# Patient Record
Sex: Male | Born: 1935 | Race: White | Hispanic: No | Smoking: Never smoker
Health system: Southern US, Community
[De-identification: ages and names within clinical notes are randomized; demographics above are authoritative.]

## PROBLEM LIST (undated history)

## (undated) DIAGNOSIS — I472 Ventricular tachycardia, unspecified (HCC): Principal | ICD-10-CM

## (undated) DIAGNOSIS — R9431 Abnormal electrocardiogram [ECG] [EKG]: Secondary | ICD-10-CM

## (undated) DIAGNOSIS — Z950 Presence of cardiac pacemaker: Principal | ICD-10-CM

## (undated) DIAGNOSIS — I639 Cerebral infarction, unspecified: Principal | ICD-10-CM

## (undated) DIAGNOSIS — G301 Alzheimer's disease with late onset: Principal | ICD-10-CM

## (undated) DIAGNOSIS — M1990 Unspecified osteoarthritis, unspecified site: Secondary | ICD-10-CM

## (undated) DIAGNOSIS — E781 Pure hyperglyceridemia: Secondary | ICD-10-CM

## (undated) DIAGNOSIS — E039 Hypothyroidism, unspecified: Secondary | ICD-10-CM

## (undated) DIAGNOSIS — F5102 Adjustment insomnia: Secondary | ICD-10-CM

## (undated) DIAGNOSIS — J209 Acute bronchitis, unspecified: Secondary | ICD-10-CM

## (undated) DIAGNOSIS — F0280 Dementia in other diseases classified elsewhere without behavioral disturbance: Secondary | ICD-10-CM

## (undated) DIAGNOSIS — I4729 Other ventricular tachycardia: Secondary | ICD-10-CM

## (undated) DIAGNOSIS — I480 Paroxysmal atrial fibrillation: Secondary | ICD-10-CM

## (undated) DIAGNOSIS — R7611 Nonspecific reaction to tuberculin skin test without active tuberculosis: Secondary | ICD-10-CM

## (undated) HISTORY — DX: Unspecified osteoarthritis, unspecified site: M19.90

---

## 2012-05-27 LAB — TYPE AND SCREEN
ABO/Rh: A POS
Antibody Screen: NEGATIVE

## 2012-05-27 LAB — EKG, 12 LEAD, INITIAL
Atrial Rate: 75 {beats}/min
Calculated P Axis: 78 degrees
Calculated R Axis: 57 degrees
Calculated T Axis: 61 degrees
Diagnosis: NORMAL
P-R Interval: 176 ms
Q-T Interval: 410 ms
QRS Duration: 92 ms
QTC Calculation (Bezet): 457 ms
Ventricular Rate: 75 {beats}/min

## 2012-05-27 LAB — URINALYSIS W/ REFLEX CULTURE
Bacteria: NEGATIVE /HPF
Bilirubin: NEGATIVE
Blood: NEGATIVE
Glucose: NEGATIVE MG/DL
Leukocyte Esterase: NEGATIVE
Nitrites: NEGATIVE
Protein: NEGATIVE MG/DL
Specific gravity: 1.023 (ref 1.003–1.030)
Urobilinogen: 0.2 EU/DL (ref 0.2–1.0)
pH (UA): 7 (ref 5.0–8.0)

## 2012-05-27 LAB — METABOLIC PANEL, BASIC
Anion gap: 9 mmol/L (ref 5–15)
BUN/Creatinine ratio: 18 (ref 12–20)
BUN: 12 MG/DL (ref 6–20)
CO2: 27 MMOL/L (ref 21–32)
Calcium: 9.1 MG/DL (ref 8.5–10.1)
Chloride: 105 MMOL/L (ref 97–108)
Creatinine: 0.66 MG/DL (ref 0.45–1.15)
GFR est AA: >60 mL/min/{1.73_m2} (ref >60)
GFR est non-AA: >60 mL/min/{1.73_m2} (ref >60)
Glucose: 87 MG/DL (ref 65–100)
Potassium: 4.3 MMOL/L (ref 3.5–5.1)
Sodium: 141 MMOL/L (ref 136–145)

## 2012-05-27 LAB — CBC W/O DIFF
HCT: 43.4 % (ref 36.6–50.3)
HGB: 14.9 g/dL (ref 12.1–17.0)
MCH: 32.8 PG (ref 26.0–34.0)
MCHC: 34.3 g/dL (ref 30.0–36.5)
MCV: 95.6 FL (ref 80.0–99.0)
PLATELET: 153 10*3/uL (ref 150–400)
RBC: 4.54 M/uL (ref 4.10–5.70)
RDW: 14.3 % (ref 11.5–14.5)
WBC: 5.6 10*3/uL (ref 4.1–11.1)

## 2012-05-27 LAB — PROTHROMBIN TIME + INR
INR: 1 (ref 0.9–1.1)
Prothrombin time: 10.9 s (ref 9.4–11.7)

## 2012-05-27 LAB — TYPE & SCREEN
ABO/Rh(D): A POS
Antibody screen: NEGATIVE

## 2012-05-27 LAB — HEMOGLOBIN A1C WITH EAG
Est. average glucose: 114 mg/dL
Hemoglobin A1c: 5.6 % (ref 4.2–6.3)

## 2012-05-27 NOTE — Other (Signed)
Patient given surgical site infection FAQs handout. Discussed importance of good hand hygiene. Patient verbalizes understanding. Diabetes Control Matters Handout given to the patient. Patient attending the Joint Class this morning.

## 2012-05-28 LAB — CULTURE, MRSA

## 2012-05-28 NOTE — Other (Signed)
Faxed PAT testing reports to Dr Dobzyniak.

## 2012-06-15 NOTE — H&P (Signed)
**Note Danny-Identified via Obfuscation** [name]  05/04/2012 1:52 PM  Location: [facility]  Patient #: [id number]  DOB: 04/27/1932  Married / Language: English / Race: White  Male      History of Present Illness??(Carlyle Lipa, MD; 05/04/2012 5:47 PM)  ??????????????????The patient is a 78 year old male who presents with a complaint of right knee pain. This is a patient it's been treated by Dr. Franky Macho for the last several years with respect to his knee.?? He gets periodic injections in his right knee.?? They do help for a period time with his symptoms.?? He also complains of left knee symptoms.?? He states today that the right side is much worse than the left.?? The injections do not seem to be helping him.    Patient does state that his pain pattern has changed since he originally saw Dr. Franky Macho.?? He is now experiencing more pain that radiates up into his right thigh and into his groin.?? When questioned he states that he definitely has problems getting his shoes and socks on.?? He has problems walking.?? He has problems getting into and out of the car.?? He's noticed significant stiffness in his hip.?? He's lost range of motion in his right hip.?? He does state that his symptoms are much different than when he originally saw Dr. Franky Macho.      ??  Problem List/Past Medical??(Matthew A Dobzyniak, MD; 05/04/2012 5:50 PM)  OA, KNEE (715.96)  REVIEW OF SYSTEMS: Systems were reviewed by the provider.  PAIN, KNEE?? (719.46)  MMT, ACUTE (836.0)    ??  Allergies??(Nicky Pugh, MD; 05/04/2012 3:35 PM)  No Known Drug Allergies. 11/18/2011  No Known Allergies. 11/18/2011    ??  Family History??(Nicky Pugh, MD; 05/04/2012 3:35 PM)  Breast cancer    ??  Social History??(Nicky Pugh, MD; 05/04/2012 3:35 PM)  Caffeine use.?? 11/18/2011: Drinks coffee.  HIV risk factors.?? 11/18/2011: no  Exercise.?? 11/18/2011: Walks 3-4 times a week.  Alcohol use.?? 11/18/2011: Drinks beer, wine and liquor 1 time per week having 1-2 drinks per occasion, never having more than 5 drinks per  occasion.  Seat Belt Use.?? 11/18/2011: always  Sun Exposure.?? 11/18/2011: occasionally  Tobacco / smoke exposure.?? 11/18/2011: No  Tobacco use.?? 11/18/2011: Never smoker: smokes 0 cigar(s) per week, uses 0 can(s) of smokeless tobacco per week.  Illicit drug use.?? 11/18/2011: none    ??  Medication History??(Nicky Pugh, MD; 05/04/2012 3:35 PM)  No Current Medications.    ??  Past Surgical History??(Nicky Pugh, MD; 05/04/2012 3:35 PM)  No pertinent past surgical history    ??  Diagnostic Studies History??(Carlyle Lipa, MD; 05/04/2012 5:48 PM)  Hip X-ray.?? Date: 05/04/2012 x-rays of this patient's right hip was obtained today.?? 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 There are large osteophytes present.?? There are large cysts in the femoral head and acetabulum.    ??  Other Problems??(Carlyle Lipa, MD; 05/04/2012 5:50 PM)  Unspecified Diagnosis  Treatment options were discussed with the patient in full.  No pertinent past medical history    ??  Vitals??(Nicky Pugh, MD; 05/04/2012 3:33 PM)  05/04/2012 3:33 PM  ??Weight: 175 lb ??Height: 73 in  Weight was reported by patient.  Height was reported by patient.  ??Body Surface Area: 2.02 m?? ??Body Mass Index: 23.09 kg/m??      ??  Physical Exam??(Matthew A Dobzyniak, MD; 05/04/2012 5:49 PM)  The physical exam findings are as follows:    ??  Musculoskeletal  Global Assessment??  Examination of related systems reveals - well-developed, well-nourished, in no acute distress, alert and oriented x 3, no rashes or ulcers of bilateral upper and lower extremities, head or trunk, no generalized swelling or edema of extremities, no digital clubbing or cyanosis, neurovascularly intact globally with normal deep tendon reflexes and normal coordination. Gait and Station??- normal posture. Note: Patient has a very antalgic gait. His hip does not extend fully.?? Right Lower Extremity??- Note: Right hip has significant diminished rotation. He has 5?? flexion contracture. His hip flexes 90??.  His hip has a fixed 15?? external rotation contracture throughout the arc motion. Logroll test and impingement sign are positive.?? Left Lower Extremity??- Note: Patient's hip was examined. Impingement and apprehension signs are negative. Hip extends fully. Hip flexes to 120+ degrees. There is no trochanteric tenderness.    Straight leg raise and femoral nerve stretch test are negative.    Extremity is sensate and perfused with palpable dorsalis pedis pulse.    Hip flexors, quads, ankle plantar flexors, ankle dorsiflexors have 5 out of 5 strength.??      Assessment & Plan??(Carlyle Lipa, MD; 05/04/2012 5:50 PM)  OA HIP/THIGH (715.35)  Impression: Patient and I discussed his problem. At this point he's going to proceed to a total hip replacement. He's been treated for 3 years for this pain. Is become progressively worse. He's done physical therapy. He's had injections. He takes nonsteroidal anti-inflammatory medications. At this point nothing else besides a hip replacement help him with his symptoms. His pain is severe.  Current Plans  ????l????  Surgery to be scheduled    ????l????  The risks of the surgery were discussed including infection, deep vein thrombosis, pulmonary embolus, cardiorespiratory complications, anesthetic risks, possible scar formation, cardiac compromise, stroke, death, leg length inequality, subluxation and possible implant failure.  Understands the risks and wishes to proceed with surgical intervention.    ????l????  X-RAY EXAM OF PELVIS 1 or 2 VIEWS (72170)  ??  PAIN, KNEE (719.46)  Impression: Right  ??  OA, KNEE (715.96)  Impression: Right  ??  REVIEW OF SYSTEMS: Systems were reviewed by the provider.  ??  PAIN, HIP (719.45)    Ollen Bowl, MD

## 2012-06-21 ENCOUNTER — Inpatient Hospital Stay
Admit: 2012-06-21 | Discharge: 2012-06-23 | Disposition: A | Discharge status: Home Health | Payer: MEDICARE | Attending: Specialist | Admitting: Specialist

## 2012-06-21 DIAGNOSIS — M1610 Unilateral primary osteoarthritis, unspecified hip: Secondary | ICD-10-CM

## 2012-06-21 LAB — TYPE AND SCREEN
ABO/Rh: A POS
Antibody Screen: NEGATIVE

## 2012-06-21 LAB — TYPE & SCREEN
ABO/Rh(D): A POS
Antibody screen: NEGATIVE

## 2012-06-21 MED ORDER — MORPHINE 10 MG/ML INJ SOLUTION
10 mg/ml | INTRAMUSCULAR | Status: DC | PRN
Start: 2012-06-21 — End: 2012-06-21

## 2012-06-21 MED ORDER — DEXAMETHASONE SODIUM PHOSPHATE 4 MG/ML IJ SOLN
4 mg/mL | Freq: Once | INTRAMUSCULAR | Status: DC | PRN
Start: 2012-06-21 — End: 2012-06-21

## 2012-06-21 MED ORDER — EPHEDRINE SULFATE 50 MG/ML IJ SOLN
50 mg/mL | INTRAMUSCULAR | Status: DC | PRN
Start: 2012-06-21 — End: 2012-06-21

## 2012-06-21 MED ORDER — MIDAZOLAM 1 MG/ML IJ SOLN
1 mg/mL | INTRAMUSCULAR | Status: DC | PRN
Start: 2012-06-21 — End: 2012-06-21

## 2012-06-21 MED ORDER — ONDANSETRON (PF) 4 MG/2 ML INJECTION
4 mg/2 mL | INTRAMUSCULAR | Status: DC | PRN
Start: 2012-06-21 — End: 2012-06-21

## 2012-06-21 MED ORDER — SODIUM CHLORIDE 0.9 % IJ SYRG
INTRAMUSCULAR | Status: DC | PRN
Start: 2012-06-21 — End: 2012-06-21

## 2012-06-21 MED ORDER — FENTANYL CITRATE (PF) 50 MCG/ML IJ SOLN
50 mcg/mL | INTRAMUSCULAR | Status: AC | PRN
Start: 2012-06-21 — End: 2012-06-21
  Administered 2012-06-21 (×4): via INTRAVENOUS

## 2012-06-21 MED ADMIN — 0.9% sodium chloride infusion: INTRAVENOUS | @ 17:00:00 | NDC 87701099893

## 2012-06-21 MED ADMIN — morphine (PF) PCA 150 mg/30 ml: INTRAVENOUS | @ 20:00:00 | NDC 00409602804

## 2012-06-21 MED ADMIN — lactated ringers infusion: INTRAVENOUS | @ 20:00:00 | NDC 11845118709

## 2012-06-21 MED ADMIN — lactated ringers infusion: INTRAVENOUS | @ 17:00:00 | NDC 00409795309

## 2012-06-21 MED ADMIN — fentaNYL citrate (PF) 50 mcg/mL injection: @ 21:00:00 | NDC 68258301001

## 2012-06-21 MED ADMIN — celecoxib (CELEBREX) capsule 400 mg: ORAL | @ 17:00:00 | NDC 00025152534

## 2012-06-21 MED ADMIN — ceFAZolin in 0.9% NS (ANCEF) IVPB Soln 2 g: INTRAVENOUS | @ 17:00:00 | NDC 09999966850

## 2012-06-21 MED ADMIN — bacitracin 50,000 Units in 0.9% sodium chloride 3,000 mL Irrigation: @ 18:00:00 | NDC 00409798309

## 2012-06-21 MED ADMIN — ceFAZolin (ANCEF) 1 g in 0.9% sodium chloride (MBP/ADV) 50 mL MBP: INTRAVENOUS | @ 17:00:00 | NDC 63323023765

## 2012-06-21 MED ADMIN — acetaminophen (TYLENOL) tablet 1,000 mg: ORAL | @ 17:00:00 | NDC 50580045103

## 2012-06-21 MED ADMIN — 0.9% sodium chloride infusion: INTRAVENOUS | @ 21:00:00 | NDC 00409798309

## 2012-06-21 MED ADMIN — ketorolac (TORADOL) injection 15 mg: INTRAVENOUS | @ 20:00:00 | NDC 00409379501

## 2012-06-21 MED ADMIN — sodium chloride (NS) flush 5-10 mL: INTRAVENOUS | @ 18:00:00 | NDC 87701099893

## 2012-06-21 MED FILL — FENTANYL CITRATE (PF) 50 MCG/ML IJ SOLN: 50 mcg/mL | INTRAMUSCULAR | Qty: 5

## 2012-06-21 MED FILL — CEFAZOLIN 1 GRAM SOLUTION FOR INJECTION: 1 gram | INTRAMUSCULAR | Qty: 1000

## 2012-06-21 MED FILL — LACTATED RINGERS IV: INTRAVENOUS | Qty: 1000

## 2012-06-21 MED FILL — ACETAMINOPHEN 500 MG TAB: 500 mg | ORAL | Qty: 2

## 2012-06-21 MED FILL — BACITRACIN 50,000 UNIT IM: 50000 unit | INTRAMUSCULAR | Qty: 50000

## 2012-06-21 MED FILL — PHENYLEPHRINE 10 MG/ML INJECTION: 10 mg/mL | INTRAMUSCULAR | Qty: 10

## 2012-06-21 MED FILL — HYDROMORPHONE 2 MG/ML INJECTION SOLUTION: 2 mg/mL | INTRAMUSCULAR | Qty: 1

## 2012-06-21 MED FILL — FAMOTIDINE (PF) 20 MG/2 ML IV: 20 mg/2 mL | INTRAVENOUS | Qty: 2

## 2012-06-21 MED FILL — QUELICIN 20 MG/ML INJECTION SOLUTION: 20 mg/mL | INTRAMUSCULAR | Qty: 10

## 2012-06-21 MED FILL — MIDAZOLAM 1 MG/ML IJ SOLN: 1 mg/mL | INTRAMUSCULAR | Qty: 2

## 2012-06-21 MED FILL — SODIUM CHLORIDE 0.9 % IV: INTRAVENOUS | Qty: 1000

## 2012-06-21 MED FILL — MORPHINE (PF) 150 MG/30 ML CONCENTRATED INFUSION: 150 mg/30 mL | INTRAVENOUS | Qty: 30

## 2012-06-21 MED FILL — DEXAMETHASONE SODIUM PHOSPHATE 4 MG/ML IJ SOLN: 4 mg/mL | INTRAMUSCULAR | Qty: 5

## 2012-06-21 MED FILL — GLYCOPYRROLATE 0.2 MG/ML IJ SOLN: 0.2 mg/mL | INTRAMUSCULAR | Qty: 2

## 2012-06-21 MED FILL — NEOSTIGMINE METHYLSULFATE 1 MG/ML INJECTION: 1 mg/mL | INTRAMUSCULAR | Qty: 10

## 2012-06-21 MED FILL — ROCURONIUM 10 MG/ML IV: 10 mg/mL | INTRAVENOUS | Qty: 10

## 2012-06-21 MED FILL — TRANEXAMIC ACID 1,000 MG/10 ML (100 MG/ML) IV: 1000 mg/10 mL (100 mg/mL) | INTRAVENOUS | Qty: 10

## 2012-06-21 MED FILL — CEFAZOLIN 2 GRAM/50 ML NS IVPB: INTRAVENOUS | Qty: 50

## 2012-06-21 MED FILL — ONDANSETRON (PF) 4 MG/2 ML INJECTION: 4 mg/2 mL | INTRAMUSCULAR | Qty: 2

## 2012-06-21 MED FILL — KETOROLAC TROMETHAMINE 30 MG/ML INJECTION: 30 mg/mL (1 mL) | INTRAMUSCULAR | Qty: 1

## 2012-06-21 MED FILL — CELEBREX 200 MG CAPSULE: 200 mg | ORAL | Qty: 2

## 2012-06-21 MED FILL — PHARMACY INFORMATION NOTE: Qty: 1

## 2012-06-21 MED FILL — CEFAZOLIN 1 GRAM SOLUTION FOR INJECTION: 1 gram | INTRAMUSCULAR | Qty: 1

## 2012-06-21 MED FILL — LIDOCAINE HCL 2 % (20 MG/ML) IJ SOLN: 20 mg/mL (2 %) | INTRAMUSCULAR | Qty: 20

## 2012-06-21 MED FILL — DIPRIVAN 10 MG/ML INTRAVENOUS EMULSION: 10 mg/mL | INTRAVENOUS | Qty: 20

## 2012-06-21 NOTE — Op Note (Signed)
Name:      [name]                                          Surgeon:        Carlyle Lipa,   MD  Account #: [id number]                 Surgery Date:   06/21/2012  DOB:       05-01-1932  Age:       78                           Location:                                 OPERATIVE REPORT      PREOPERATIVE DIAGNOSIS: Osteoarthritis, right hip.    POSTOPERATIVE DIAGNOSIS: Osteoarthritis, right hip.    OPERATIVE PROCEDURE: Total hip replacement.    SURGEON: Carlyle Lipa, MD    ASSISTANT: Kerby Moors, PA-C    ANESTHESIA: General.    ESTIMATED BLOOD LOSS: 200 mL.    SPECIMEN: None.    COMPLICATIONS: None.    IMPLANT: Zimmer total hip replacement with ceramic on cross-linked  polyethylene bearing.    COUNTS: Sponge, instrument, and needle count were correct at the end of the  procedure.    INDICATIONS: This is a 78 year old man, who presents with severe activity  limiting pain in his right hip. He has failed nonoperative treatments  including injections, medications, and physical therapy. He has been  treated for 3 years with this right lower extremity pain. Ultimately he has  failed nonoperative treatments and presents for a hip replacement.    DESCRIPTION OF PROCEDURE: Anesthetic was initiated. Preoperative dose of  antibiotic was given. Foley catheter was placed. Right side was confirmed  as the operative side, prepped and draped in the usual sterile fashion.  Skin was covered with Ioban occlusive dressing.    Direct anterior exposure was made to the right hip through the sartorius  tensor interval. The anterior hip vasculature was cauterized, ensuring good  hemostasis anteriorly. The capsule and rectus were [name] distally. Retractors  were placed intra-articular femoral neck was osteotomized. Hip ball was  removed from the acetabulum, which was exposed and progressively reamed to  57 mm, 58 trial was impacted into the acetabulum in 45 degrees of abduction  and an anatomic-type anteversion. Osteophytes  were removed.  The press-fit was good, supplemented with 2 6.5 dome screws, poly liner was  placed. Femur was then exposed and elevated from the wound. Appropriate  release. After the femur was adequately exposed, I entered the medullary  canal, suctioned the marrow contents, broached to a size 6. Calcar planed.  The high offset stem was used. The shortest hip ball was used. The hip was  reduced and it was stable.    The hip was then dislocated. The anterior greater trochanter was trimmed to  enhance stability. I removed the trial and impacted the real stem placed  the real hip ball. The deep wound was copiously irrigated. The hip was  reduced. After copious irrigation, the capsule was closed with #2 Vicryl  sutures watertight. The rectus was reapproximated with #2 Vicryl sutures,  irrigated again, closed the fascia lata  with #2 Vicryl sutures in the  corners and a running Quill suture. Skin and subcutaneous were irrigated  and closed in standard fashion. Sterile dressing was applied. There were no  complications. No specimen. Kerby Moors, PA-C assisted for the procedure.          Carlyle Lipa, MD    cc:   Carlyle Lipa, MD        MAD/wmx; [name]: 06/21/2012 03:10 P; T: 06/21/2012 04:48 P; Doc# [id number]; Job#  [id number]

## 2012-06-21 NOTE — Progress Notes (Signed)
+  Post-Anesthesia Evaluation and Assessment    Patient: [name] MRN: [id number]  SSN: [id number]   Date of Birth: 02-04-1932  Age: 78 y.o.  Sex: male      Cardiovascular Function/Vital Signs  BP 132/78   Pulse 74   Temp 98.7 ??F (37.1 ??C)   Resp 14   Ht 6' (1.829 m)   Wt 74.844 kg (165 lb)   BMI 22.38 kg/m2   SpO2 9%    Patient is status post Procedure(s) with comments:  HIP ARTHROPLASTY TOTAL ANTERIOR APPROACH - RIGHT TOTAL HIP ARTHROPLASTY ANTERIOR APPROACH.    Nausea/Vomiting: Controlled.    Postoperative hydration reviewed and adequate.    Pain:  Pain Scale 1: Numeric (0 - 10) (06/21/12 1807)  Pain Intensity 1: 3 (06/21/12 1807)   Managed.    Neurological Status:   Neuro (WDL): Within Defined Limits (06/21/12 1631)   At baseline.    Mental Status and Level of Consciousness: Arousable.    Pulmonary Status:   O2 Device: Nasal cannula (06/21/12 1631)   Adequate oxygenation and airway patent.    Complications related to anesthesia: None    Post-anesthesia assessment completed. No concerns.    Signed By: Teresita Madura, MD    June 21, 2012

## 2012-06-21 NOTE — Op Note (Signed)
Name:      [name], [name]                                          Surgeon:        Venita Seng A Tonee Silverstein,   MD  Account #: [id number]                 Surgery Date:   06/21/2012  DOB:       11/18/1932  Age:       78                           Location:                                 OPERATIVE REPORT      PREOPERATIVE DIAGNOSIS: Osteoarthritis, right hip.    POSTOPERATIVE DIAGNOSIS: Osteoarthritis, right hip.    OPERATIVE PROCEDURE: Total hip replacement.    SURGEON: Rohail Klees A. Aryana Wonnacott, MD    ASSISTANT: Anna Dunn, PA-C    ANESTHESIA: General.    ESTIMATED BLOOD LOSS: 200 mL.    SPECIMEN: None.    COMPLICATIONS: None.    IMPLANT: Zimmer total hip replacement with ceramic on cross-linked  polyethylene bearing.    COUNTS: Sponge, instrument, and needle count were correct at the end of the  procedure.    INDICATIONS: This is a 78-year-old man, who presents with severe activity  limiting pain in his right hip. He has failed nonoperative treatments  including injections, medications, and physical therapy. He has been  treated for 3 years with this right lower extremity pain. Ultimately he has  failed nonoperative treatments and presents for a hip replacement.    DESCRIPTION OF PROCEDURE: Anesthetic was initiated. Preoperative dose of  antibiotic was given. Foley catheter was placed. Right side was confirmed  as the operative side, prepped and draped in the usual sterile fashion.  Skin was covered with Ioban occlusive dressing.    Direct anterior exposure was made to the right hip through the sartorius  tensor interval. The anterior hip vasculature was cauterized, ensuring good  hemostasis anteriorly. The capsule and rectus were [name] distally. Retractors  were placed intra-articular femoral neck was osteotomized. Hip ball was  removed from the acetabulum, which was exposed and progressively reamed to  57 mm, 58 trial was impacted into the acetabulum in 45 degrees of abduction  and an anatomic-type anteversion. Osteophytes  were removed.  The press-fit was good, supplemented with 2 6.5 dome screws, poly liner was  placed. Femur was then exposed and elevated from the wound. Appropriate  release. After the femur was adequately exposed, I entered the medullary  canal, suctioned the marrow contents, broached to a size 6. Calcar planed.  The high offset stem was used. The shortest hip ball was used. The hip was  reduced and it was stable.    The hip was then dislocated. The anterior greater trochanter was trimmed to  enhance stability. I removed the trial and impacted the real stem placed  the real hip ball. The deep wound was copiously irrigated. The hip was  reduced. After copious irrigation, the capsule was closed with #2 Vicryl  sutures watertight. The rectus was reapproximated with #2 Vicryl sutures,  irrigated again, closed the fascia lata   with #2 Vicryl sutures in the  corners and a running Quill suture. Skin and subcutaneous were irrigated  and closed in standard fashion. Sterile dressing was applied. There were no  complications. No specimen. Anna Dunn, PA-C assisted for the procedure.          Harleigh Civello A Ruther Ephraim, MD    cc:   Kataryna Mcquilkin A Luane Rochon, MD        MAD/wmx; [name]: 06/21/2012 03:10 P; T: 06/21/2012 04:48 P; Doc# [id number]; Job#  [id number]

## 2012-06-21 NOTE — Other (Signed)
TRANSFER - OUT REPORT:    Verbal report given to lisa() o GILLIAN KLUEVER  being transferred to 556(ut for routine progression of care       Report consisted of patient???s Situation, Background, Assessment and   Recommendations(SBAR).     Information from the following report(s) SBAR was reviewed with the receiving nurse.    Opportunity for questions and clarification was provided.

## 2012-06-21 NOTE — Progress Notes (Signed)
Bedside shift change report given to Angie, RN (oncoming nurse) by Lisa, RN (offgoing nurse).  Report given with SBAR and Kardex.

## 2012-06-21 NOTE — Brief Op Note (Signed)
BRIEF OPERATIVE NOTE    Date of Procedure: 06/21/2012   Preoperative Diagnosis: OSTEOARTHRITIS, HIP PAIN  Postoperative Diagnosis: OSTEOARTHRITIS, HIP PAIN    Procedure: Procedure(s):  RIGHT TOTAL HIP ARTHROPLASTY ANTERIOR APPROACH  Surgeon(s) and Role:     * Birdena Crandall, MD - Primary  Anesthesia: General   Estimated Blood Loss: 200cc  Specimens: * No specimens in log *   Findings: oa severe   Complications: none  Implants:   Implant Name Type Inv. Item Serial No. Manufacturer Lot No. LRB No. Used Action   SHELL CONTINUUM CLUST - SN/A  SHELL CONTINUUM CLUST n/a [employer] [id number] Right 1 Implanted   SCR BNE ACET CANC PINN 6.5X45 - SN/A  SCR BNE ACET CANC PINN 6.5X45 n/a [employer] [id number] Right 1 Implanted   ZIMMER CONTINUUM ACETABULAR SYSTEM VIVACIT-E VITAMIN E HIGHLY CROSSLINKED POLYETHYLENE NEUTRAL LINER I.D. SIZE LL Joint Component  n/a  [id number] Right 1 Implanted   ZIMMER BONE SCREW SELF-TAPPING 6.5MM DIAMETER LENGTH Screw  N/A  [id number] Right 1 Implanted   ZIMMER BIOLOX DELTA CERAMIC FEMORAL HEAD 36/-3.5 TAPER 12/14 ALUMINA MATRIX COMPOSITE Joint Component  N/A  [id number] Right 1 Implanted   ZIMMER AVENIR MULLER STEM LATERAL UNCEMENTED HA 6 TAPER 12/14 [id number] ISO 5832-3 Joint Component   N/A   [id number] Right 1 Implanted

## 2012-06-22 LAB — METABOLIC PANEL, BASIC
Anion gap: 8 mmol/L (ref 5–15)
BUN/Creatinine ratio: 21 — ABNORMAL HIGH (ref 12–20)
BUN: 14 MG/DL (ref 6–20)
CO2: 23 MMOL/L (ref 21–32)
Calcium: 7.7 MG/DL — ABNORMAL LOW (ref 8.5–10.1)
Chloride: 107 MMOL/L (ref 97–108)
Creatinine: 0.67 MG/DL (ref 0.45–1.15)
GFR est AA: >60 mL/min/{1.73_m2} (ref >60)
GFR est non-AA: >60 mL/min/{1.73_m2} (ref >60)
Glucose: 147 MG/DL — ABNORMAL HIGH (ref 65–100)
Potassium: 4.6 MMOL/L (ref 3.5–5.1)
Sodium: 138 MMOL/L (ref 136–145)

## 2012-06-22 LAB — HEMOGLOBIN: HGB: 13 g/dL (ref 12.1–17.0)

## 2012-06-22 LAB — PROTHROMBIN TIME + INR
INR: 1.1 (ref 0.9–1.1)
Prothrombin time: 11.6 s (ref 9.4–11.7)

## 2012-06-22 MED ADMIN — warfarin (COUMADIN) tablet 4 mg: ORAL | @ 02:00:00 | NDC 00056016801

## 2012-06-22 MED ADMIN — acetaminophen (OFIRMEV) infusion 1,000 mg: INTRAVENOUS | @ 04:00:00 | NDC 43825010201

## 2012-06-22 MED ADMIN — ceFAZolin in 0.9% NS (ANCEF) IVPB Soln 2 g: INTRAVENOUS | @ 02:00:00 | NDC 09999966850

## 2012-06-22 MED ADMIN — acetaminophen (OFIRMEV) infusion 1,000 mg: INTRAVENOUS | @ 10:00:00 | NDC 43825010201

## 2012-06-22 MED ADMIN — 0.9% sodium chloride infusion: INTRAVENOUS | @ 12:00:00 | NDC 00409798348

## 2012-06-22 MED ADMIN — warfarin (COUMADIN) tablet 4 mg: ORAL | @ 15:00:00 | NDC 00056016801

## 2012-06-22 MED ADMIN — Warfarin dosing per pharmacy: @ 16:00:00 | NDC 00740100004

## 2012-06-22 MED ADMIN — acetaminophen (OFIRMEV) infusion 1,000 mg: INTRAVENOUS | @ 15:00:00 | NDC 43825010201

## 2012-06-22 MED ADMIN — ondansetron (ZOFRAN ODT) tablet 4 mg: ORAL | @ 14:00:00 | NDC 68462015740

## 2012-06-22 MED ADMIN — sodium chloride (NS) flush 5-10 mL: INTRAVENOUS | @ 18:00:00 | NDC 87701099893

## 2012-06-22 MED ADMIN — sodium chloride (NS) flush 5-10 mL: INTRAVENOUS | @ 10:00:00 | NDC 87701099893

## 2012-06-22 MED ADMIN — ceFAZolin in 0.9% NS (ANCEF) IVPB Soln 2 g: INTRAVENOUS | @ 09:00:00 | NDC 09999966850

## 2012-06-22 MED ADMIN — celecoxib (CELEBREX) capsule 200 mg: ORAL | @ 14:00:00 | NDC 00025152534

## 2012-06-22 MED FILL — OFIRMEV 1,000 MG/100 ML (10 MG/ML) INTRAVENOUS SOLUTION: 1000 mg/100 mL (10 mg/mL) | INTRAVENOUS | Qty: 100

## 2012-06-22 MED FILL — CELEBREX 200 MG CAPSULE: 200 mg | ORAL | Qty: 1

## 2012-06-22 MED FILL — ONDANSETRON 4 MG TAB, RAPID DISSOLVE: 4 mg | ORAL | Qty: 1

## 2012-06-22 MED FILL — COUMADIN 4 MG TABLET: 4 mg | ORAL | Qty: 1

## 2012-06-22 MED FILL — CEFAZOLIN 2 GRAM/50 ML NS IVPB: INTRAVENOUS | Qty: 50

## 2012-06-22 NOTE — Progress Notes (Addendum)
Pt pulse ox sounding at nursing station (pulse ox suspended) in to check on pt. Pt has feet on floor stating "I'm going to fix it" reoriented pt to place and time.  Explained to pt for safety reason will move him to a room closer to nurse station, pt verbalized understanding. Moved pt to 576(closer to nursing station). Pt states "This room is smaller". Pt now alert & oriented X4.  Bed alarm placed

## 2012-06-22 NOTE — Progress Notes (Signed)
Bedside and Verbal shift change report given to melanie thompson lpn and becky smith rn (oncoming nurse) by jacquelyn wolowic rn (offgoing nurse).  Report given with SBAR, Kardex, OR Summary, Intake/Output and MAR.

## 2012-06-22 NOTE — Progress Notes (Signed)
Problem: Mobility Impaired (Adult and Pediatric)  Goal: *Acute Goals and Plan of Care (Insert Text)  Physical Therapy Goals  Initiated 06/22/2012    1. Patient will move from supine to sit and sit to supine in bed with independence within 4 days.  2. Patient will perform sit to stand with modified independence within 4 days.  3. Patient will ambulate with modified independence for 200 feet with the least restrictive device within 4 days.  4. Patient will ascend/descend 3 stairs with use of handrail(s) with modified independence within 4 days.  5. Patient will perform hip home exercise program per protocol with independence within 4 days.  PHYSICAL THERAPY TREATMENT  Patient: [name] (78 y.o. male)  Date: 06/22/2012  Diagnosis: OSTEOARTHRITIS, HIP PAIN  OSTEOARTHRITIS, HIP PAIN <principal problem not specified>  Procedure(s) (LRB):  HIP ARTHROPLASTY TOTAL ANTERIOR APPROACH (Right) 1 Day Post-Op  Precautions: WBAT  ASSESSMENT:   Pt progressing well with therapy as he was able to ambulate 100 ft with RW, CGA/SBA for safety. Cues throughout for step through gait. Pt returned to chair post ambulation. RW ordered and delivered from [employer]. Pt and coach asking appropriate questions. Anticipate he will be safe for d/c home with family and HHPT.   Progression toward goals:  [X]       Improving appropriately and progressing toward goals  [ ]       Improving slowly and progressing toward goals  [ ]       Not making progress toward goals and plan of care will be adjusted       PLAN:   Patient continues to benefit from skilled intervention to address the above impairments.  Continue treatment per established plan of care.  Discharge Recommendations:  Home Health  Further Equipment Recommendations for Discharge:  RW ordered and delivered       SUBJECTIVE:   "You have to teach me again how to do the walking."      OBJECTIVE DATA SUMMARY:   Functional Mobility Training:  Bed Mobility:  Supine to Sit: Modified  independence, requires equipment  Sit to Supine:  (OOB to chair)     Transfers:  Sit to Stand: Supervision  Stand to Sit: Supervision        Ambulation/Gait Training:  Distance (ft): 100 Feet (ft)  Assistive Device: Walker, rolling;Gait belt  Ambulation - Level of Assistance: CGA;SBA  Gait Description (WDL): Exceptions to WDL  Gait Abnormalities:  (cues for step through gait)  Right Side Weight Bearing: As tolerated  Speed/Cadence: Slow     Therapeutic Exercises:   Exercises performed per protocol.  See morning treatment note for description.  Pain:  Pain Scale 1: Numeric (0 - 10)  Pain Intensity 1: 2  Pain Description 1: Aching  Pain Intervention(s) 1: Medication (see MAR)  Activity Tolerance:   NAD  Please refer to the flowsheet for vital signs taken during this treatment.  After treatment:   [X]  Patient left in no apparent distress sitting up in chair  [ ]  Patient left in no apparent distress in bed  [X]  Call bell left within reach  [X]  Nursing notified  [X]  Caregiver present  [ ]  Bed alarm activated      COMMUNICATION/COLLABORATION:   The patient???s plan of care was discussed with: Registered Nurse    Vonzell Schlatter Vellucci, PT   Time Calculation: 15 mins

## 2012-06-22 NOTE — Progress Notes (Signed)
Pressure Ulcer Documentation  (COMPLETE ONE LABEL PER PRESSURE ULCER)  For further information, please review corresponding Wound Care flowsheet.      Andrey Spearman has:           Location Number:      Stage:        Size (cm):   Tissue (%):   Kathrine Rieves R Clevon Khader, LPN

## 2012-06-22 NOTE — Progress Notes (Signed)
Pressure Ulcer Documentation  (COMPLETE ONE LABEL PER PRESSURE ULCER)  For further information, please review corresponding Wound Care flowsheet.      Andrey Spearman has:    No pressure ulcer noted and pressure ulcer prevention initiated.      Location Number:      Stage:         Size (cm):  Length:  Width:  Depth:  Undermining/Tracking:      Tissue (%):  Red:  Pink:  Yellow:  Necrotic:  Maroon/Purple:      Tyler Aas, RN

## 2012-06-22 NOTE — Progress Notes (Signed)
[facility]thopedic [facility] Progress Note    June 22, 2012  Admit Date: 06/21/2012  Admit Diagnosis: OSTEOARTHRITIS, HIP PAIN  OSTEOARTHRITIS, HIP PAIN    Post Op day: 1 Day Post-Op    Subjective:     [name] states that he is feeling good. Off PCA now. Some confusion last night? N controlled now. No movement with PT yet. Eager to get up.  Tolerating diet  Denies N/V/SOB or CP      Objective:     PT/OT:     PATIENT MOBILITY                           Vital Signs:    Blood pressure 133/69, pulse 66, temperature 97.8 ??F (36.6 ??C), resp. rate 18, height 6' (1.829 m), weight 74.844 kg (165 lb), SpO2 97.00%.  Temp (24hrs), Avg:98.1 ??F (36.7 ??C), Min:97.6 ??F (36.4 ??C), Max:98.7 ??F (37.1 ??C)      Pain Control:   Pain Assessment  Pain Scale 1: Numeric (0 - 10)  Pain Intensity 1: 0  Pain Location 1: Hip  Pain Orientation 1: Left  Pain Description 1: Aching  Pain Intervention(s) 1: Encouraged PCA    Meds:  Current Facility-Administered Medications   Medication Dose Route Frequency   ??? fentaNYL citrate (PF) injection 25 mcg  25 mcg IntraVENous Multiple   ??? celecoxib (CELEBREX) capsule 400 mg  400 mg Oral ONCE   ??? acetaminophen (TYLENOL) tablet 1,000 mg  1,000 mg Oral ONCE   ??? ceFAZolin in 0.9% NS (ANCEF) IVPB Soln 2 g  2 g IntraVENous ONCE   ??? acetaminophen (OFIRMEV) infusion 1,000 mg  1,000 mg IntraVENous Q6H   ??? morphine (PF) PCA 150 mg/30 ml   IntraVENous TITRATE   ??? 0.9% sodium chloride infusion  125 mL/hr IntraVENous CONTINUOUS   ??? sodium chloride (NS) flush 5-10 mL  5-10 mL IntraVENous Q8H   ??? sodium chloride (NS) flush 5-10 mL  5-10 mL IntraVENous PRN   ??? acetaminophen (TYLENOL) tablet 650 mg  650 mg Oral Q4H PRN   ??? celecoxib (CELEBREX) capsule 200 mg  200 mg Oral DAILY   ??? oxyCODONE IR (ROXICODONE) tablet 5-10 mg  5-10 mg Oral Q3H PRN   ??? HYDROcodone-acetaminophen (NORCO) 5-325 mg per tablet 1 Tab  1 Tab Oral Q4H PRN   ??? HYDROcodone-acetaminophen (NORCO) 7.5-325 mg per tablet 1 Tab  1 Tab Oral Q4H PRN   ??? morphine injection 2  mg  2 mg IntraVENous Q3H PRN   ??? naloxone (NARCAN) injection 0.4 mg  0.4 mg IntraVENous PRN   ??? ondansetron (ZOFRAN) injection 4 mg  4 mg IntraVENous Q4H PRN   ??? ondansetron (ZOFRAN ODT) tablet 4 mg  4 mg Oral Q4H PRN   ??? senna-docusate (PERICOLACE) 8.6-50 mg per tablet 1 Tab  1 Tab Oral QHS   ??? magnesium hydroxide (MILK OF MAGNESIA) oral suspension 30 mL  30 mL Oral ONCE   ??? magnesium hydroxide (MILK OF MAGNESIA) oral suspension 30 mL  30 mL Oral DAILY PRN   ??? bisacodyl (DULCOLAX) suppository 10 mg  10 mg Rectal DAILY PRN   ??? ceFAZolin in 0.9% NS (ANCEF) IVPB Soln 2 g  2 g IntraVENous Q8H   ??? ketorolac (TORADOL) injection 15 mg  15 mg IntraVENous Q6H PRN   ??? diphenhydrAMINE (BENADRYL) injection 25 mg  25 mg IntraVENous Q4H PRN   ??? diphenhydrAMINE (BENADRYL) capsule 25 mg  25 mg Oral  Q4H PRN   ??? fentaNYL citrate (PF) 50 mcg/mL injection       ??? Warfarin dosing per pharmacy  1 Each Other DAILY   ??? warfarin (COUMADIN) tablet 4 mg  4 mg Oral ONCE        LAB:    Lab Results   Component Value Date/Time    INR 1.1 06/22/2012  3:48 AM    INR 1.0 05/27/2012  9:00 AM     Lab Results   Component Value Date/Time    HGB 13.0 06/22/2012  3:48 AM    HGB 14.9 05/27/2012  9:00 AM         Dressing:      Wound:       Physical Exam:    Dressing is clean, dry, and intact  Neurovascular checks within normal limits  Orientation:  Oriented    Assessment:      Active Problems:   * No active hospital problems. *        Plan:     Continue PT/OT/Rehab  Consult: Rehab team including PT, OT, recreational therapy, and social services  D/C PCA and transition to PO pain management  Continue DVT prophylaxis    Discharge To:  pending       Signed By: Genene Churn. Daiva Huge, PA

## 2012-06-22 NOTE — Progress Notes (Signed)
.  Bedside and Verbal shift change report given to Jackie (oncoming nurse) by Angie (offgoing nurse).  Report given with SBAR.

## 2012-06-22 NOTE — Progress Notes (Signed)
Problem: Mobility Impaired (Adult and Pediatric)  Goal: *Acute Goals and Plan of Care (Insert Text)  Physical Therapy Goals  Initiated 06/22/2012    1. Patient will move from supine to sit and sit to supine in bed with independence within 4 days.  2. Patient will perform sit to stand with modified independence within 4 days.  3. Patient will ambulate with modified independence for 200 feet with the least restrictive device within 4 days.  4. Patient will ascend/descend 3 stairs with use of handrail(s) with modified independence within 4 days.  5. Patient will perform hip home exercise program per protocol with independence within 4 days.  PHYSICAL THERAPY EVALUATION  Patient: [name] (78 y.o. male)  Date: 06/22/2012  Primary Diagnosis: OSTEOARTHRITIS, HIP PAIN  OSTEOARTHRITIS, HIP PAIN  Procedure(s) (LRB):  HIP ARTHROPLASTY TOTAL ANTERIOR APPROACH (Right) 1 Day Post-Op   Precautions:  WBAT      ASSESSMENT :   Based on the objective data described below, the patient presents with decreased R hip ROM/strength, mild c/o pain with mobility and overall difficulty with functional mobility compared to baseline. Pt performed hip exercises in supine prior to ambulation. He was able to ambulate 40 ft with use of RW, CGA for safety. Anticipate he will continue to progress quickly with therapy and be safe for d/c home with assist from family and HHPT. Pt wishing to attempt using crutches for stair training and to have RW ordered for d/c.     Patient will benefit from skilled intervention to address the above impairments.  Patient???s rehabilitation potential is considered to be Good  Factors which may influence rehabilitation potential include:   [X]          None noted  [ ]          Mental ability/status  [ ]          Medical condition  [ ]          Home/family situation and support systems  [ ]          Safety awareness  [ ]          Pain tolerance/management  [ ]          Other:        PLAN :   Recommendations and Planned  Interventions:  [X]            Bed Mobility Training             [ ]     Neuromuscular Re-Education  [X]            Transfer Training                   [ ]     Orthotic/Prosthetic Training  [X]            Gait Training                         [ ]     Modalities  [X]            Therapeutic Exercises           [ ]     Edema Management/Control  [X]            Therapeutic Activities            [X]     Patient and Family Training/Education  [ ]            Other (comment):    Frequency/Duration: Patient will be followed by physical therapy  2 times per day/4-7 days per week to address goals.  Discharge Recommendations: Home Health  Further Equipment Recommendations for Discharge: rolling walker       SUBJECTIVE:   Patient stated ???I am feeling pretty good actually.???      OBJECTIVE DATA SUMMARY:       Past Medical History   Diagnosis Date   ??? Chronic pain         RIGHT HIP   ??? Arthritis     History reviewed. No pertinent past surgical history.  Prior Level of Function/Home Situation: indep   Home Situation  Home Environment: Private residence  # Steps to Enter: 3   Rails to Enter: Yes  Hand Rails : Bilateral  One/Two Story Residence: Two story, live on 1st floor  Living Alone: Yes   Support Systems: Family member(s) (Brother/Sister in [place] will be with him at d/c)  Patient Expects to be Discharged to:: Private residence  Current DME Used/Available at Home: Crutches  Critical Behavior:  Neurologic State: Alert  Orientation Level: Oriented X4  Cognition: Appropriate decision making;Appropriate for age attention/concentration;Follows commands  Safety/Judgement: Awareness of environment  Skin:  Appears intact, R hip dressing c/d/i  Strength:    Strength: Generally decreased, functional     Tone & Sensation:   Tone: Normal  Sensation: Intact     Range Of Motion:  AROM: Generally decreased, functional     Coordination:  Coordination: Within functional limits    Functional Mobility:  Bed Mobility:  Supine to Sit: Supervision  Sit to Supine:   (OOB to chair post ambulation)     Transfers:  Sit to Stand: CGA;Assist X1 (bed height raised)  Stand to Sit: CGA;Assist X1;Verbal cues (cues to reach back for chair prior to sitting)        Balance:   Sitting: Intact  Standing: Intact;With support  Ambulation/Gait Training:  Distance (ft): 40 Feet (ft)  Assistive Device: Walker, rolling;Gait belt  Ambulation - Level of Assistance: CGA  Gait Description (WDL): Exceptions to WDL  Gait Abnormalities: Step to gait  Right Side Weight Bearing: As tolerated  Speed/Cadence: Slow    Therapeutic Exercises:   Pt performed AP's, quad sets, glute sets, heel slides and hip abduction (active assist) x 10 reps each.     Pain:  Pain Scale 1: Numeric (0 - 10)  Pain Intensity 1: 2  Ice applied to R hip post ambulation; Pt using PCA pre ambulation     Activity Tolerance:   NAD, VSS  Please refer to the flowsheet for vital signs taken during this treatment.  After treatment:   [X]          Patient left in no apparent distress sitting up in chair  [ ]          Patient left in no apparent distress in bed  [X]          Call bell left within reach  [X]          Nursing notified  [X]          Caregiver present  [ ]          Bed alarm activated      COMMUNICATION/EDUCATION:   The patient???s plan of care was discussed with: Registered Nurse.  [X]          Fall prevention education was provided and the patient/caregiver indicated understanding.  [X]          Patient/family have participated as able in goal setting and plan of care.  [  X]         Patient/family agree to work toward stated goals and plan of care.  [ ]          Patient understands intent and goals of therapy, but is neutral about his/her participation.  [ ]          Patient is unable to participate in goal setting and plan of care.    Thank you for this referral.  Vonzell Schlatter Vellucci, PT   Time Calculation: 28 mins

## 2012-06-22 NOTE — Progress Notes (Addendum)
Location: 5S1ORT - [id number]  Attn.: [name]  DOB: 05-Jan-1932 / Age: 78  MR#: [id number] / Admit#: [id number]  Pt. First Name: [name]  Pt. Last Name: [name]  [address]  [place], [place]  [address]                        Case Management - Progress Note  Initial Open Date: 06/21/2012  Case Manager: [name]    Initial Open Date: 06/22/2012  Social Worker: [name]  Expected Date of Discharge: 06/24/2012  Transferred From: home  ECF Bed Held Until:  Bed Held By:  Power of Attorney: relative-[name] & [name] [phone]  POA/Guardian/Conservator Capacity:  Primary Caregiver:  Living Arrangements: Lives Alone  Source of Income:  Payee:  Psychosocial History:  Cultural/Religious/Language Issues:  Education Level:  ADLS/Current Living Arrangements Issues: self care pta  Past Providers:  Will patient perform self care at discharge? [name]  Anticipated Discharge Disposition Goal: Home with Home Health Care  Assessment/Plan:      06/22/2012 03:02P Home care orders noted. CRM spoke with  the patient regarding agency choice. The patient would like to use [facility]. Referral sent via [name] and accepted. Will follow as needed. [name]  [name], CRM.          06/22/2012 08:36A: Chart reviewed for medical necessity.  Referral sent to  sw for discharge planning.  Following for medical necessity and care  management needs.  S.Spence,RN,CRM  Resources at Discharge:  Service Providers at Discharge:  Dictating Provider:  Leana [name]

## 2012-06-22 NOTE — Progress Notes (Signed)
Problem: Self Care Deficits Care Plan (Adult)  Goal: *Acute Goals and Plan of Care (Insert Text)  Occupational Therapy Goals  Initiated: 06/22/12  1. Patient will perform grooming with supervision/set-up standing at sink within 3 day(s).  2. Patient will perform bathing with supervision/set-up from chair within 3 day(s).  3. Patient will perform upper body dressing and lower body dressing with supervision/set-up within 3 day(s).  4. Patient will perform toilet transfers with supervision/set-up within 3 day(s).  5. Patient will perform all aspects of toileting with supervision/set-up within 3 day(s).  6. Patient will be independent with anterior hip precautions within 3 days.  OCCUPATIONAL THERAPY EVALUATION  Patient: [name] (78 y.o. male)  Date: 06/22/2012  Primary Diagnosis: OSTEOARTHRITIS, HIP PAIN  OSTEOARTHRITIS, HIP PAIN  Procedure(s) (LRB):  HIP ARTHROPLASTY TOTAL ANTERIOR APPROACH (Right) 1 Day Post-Op   Precautions:   WBAT      ASSESSMENT :   Based on the objective data described below, the patient presents with decreased independence with all tasks at this time following anterior THR.  Pt introduced to anterior hip precautions this am and coach present for training as well.  Introduced AE as well.  Pt will require reinforcement for safety with mobility and for use of AE to complete lower body dressing tasks.  Pt will discharge home with coach who is very supportive and asking appropriate questions.  Pt will need hip kit and walker for home use.      Patient will benefit from skilled intervention to address the above impairments.  Patient???s rehabilitation potential is considered to be Good  Factors which may influence rehabilitation potential include:   [X]              None noted  [ ]              Mental ability/status  [ ]              Medical condition  [ ]              Home/family situation and support systems  [ ]              Safety awareness  [ ]              Pain tolerance/management  [ ]               Other:        PLAN :   Recommendations and Planned Interventions:  [X]                Self Care Training                  [X]         Therapeutic Activities  [X]                Functional Mobility Training    [ ]         Cognitive Retraining  [ ]                Therapeutic Exercises           [X]         Endurance Activities  [X]                Balance Training                   [ ]         Neuromuscular Re-Education  [ ]   Visual/Perceptual Training     [X]    Home Safety Training  [X]                Patient Education                 [X]         Family Training/Education  [ ]                Other (comment):    Frequency/Duration: Patient will be followed by occupational therapy 5 times a week to address goals.  Discharge Recommendations: Home Health  Further Equipment Recommendations for Discharge: hip kit and walker       SUBJECTIVE:   Patient stated ???I am feeling good.???      OBJECTIVE DATA SUMMARY:       Past Medical History   Diagnosis Date   ??? Chronic pain         RIGHT HIP   ??? Arthritis     History reviewed. No pertinent past surgical history.  Prior Level of Function/Home Situation: Pt reports independence at home but difficulty reaching feet for dressing tasks.   Home Situation  Home Environment: Private residence  # Steps to Enter: 3   Rails to Enter: Yes  Hand Rails : Bilateral  One/Two Story Residence: Two story, live on 1st floor  Living Alone: Yes   Support Systems: Family member(s) (Brother/Sister in [place] will be with him at d/c)  Patient Expects to be Discharged to:: Private residence  Current DME Used/Available at Home: Crutches  [X]   Right hand dominant   [ ]   Left hand dominant  Cognitive/Behavioral Status:  Neurologic State: Alert  Orientation Level: Oriented X4  Cognition: Appropriate for age attention/concentration  Perception: Appears intact  Perseveration: No perseveration noted  Safety/Judgement: Good awareness of safety precautions  Skin: dressing intact  Edema: none noted  Vision/Perceptual:                            Acuity: Within Defined Limits       Coordination:  Coordination: Within functional limits  Fine Motor Skills-Upper: Right Intact;Left Intact    Gross Motor Skills-Upper: Right Intact;Left Intact  Balance:  Sitting: Intact  Standing: Intact;With support  Strength:    Strength: Within functional limits              Tone & Sensation:    Tone: Normal  Sensation: Intact                    Range of Motion:    AROM: Within functional limits                       Functional Mobility and Transfers for ADLs:  Bed Mobility:     Supine to Sit:  (recieved in chair)  Sit to Supine: Minimum assistance     Transfers:  Sit to Stand: CGA     Bed to Chair: CGA                                Toilet Transfer : Contact guard assistance  ADL Assessment:  Feeding: Supervision/set up    Oral Facial Hygiene/Grooming: Supervision/set up    Bathing: Minimum assistance    Upper Body Dressing: Supervision/set up    Lower Body Dressing: Minimum assistance    Toileting: Contact guard assistance              ADL Intervention:   Pt introduced to lower body dressing with use of AE.  Pt did well with training and education. Pt will require reinforcement for use of AE to complete all tasks safely.  Pt completed socks this am with reacher, dressing stick, and sock aid.  Demonstrated use of reacher and long shoe horn for pants and shoes but will follow up tomorrow.                                   Cognitive Retraining  Safety/Judgement: Good awareness of safety precautions      Pain:  Pain Scale 1: Numeric (0 - 10)  Pain Intensity 1: 0              Activity Tolerance:   VSS throughout session.   After treatment:   [ ]  Patient left in no apparent distress sitting up in chair  [X]  Patient left in no apparent distress in bed  [X]  Call bell left within reach  [X]  Nursing notified  [ ]  Caregiver present  [ ]  Bed alarm activated      COMMUNICATION/EDUCATION:   The patient???s plan of care  was discussed with: Physical Therapist and Registered Nurse.  [X]  Home safety education was provided and the patient/caregiver indicated understanding.  [X]  Patient/family have participated as able in goal setting and plan of care.  [ ]  Patient/family agree to work toward stated goals and plan of care.  [ ]  Patient understands intent and goals of therapy, but is neutral about his/her participation.  [ ]  Patient is unable to participate in goal setting and plan of care.  This patient???s plan of care is appropriate for delegation to OTA.    Thank you for this referral.  Santa Lighter, OT  Time Calculation: 23 mins

## 2012-06-23 LAB — PROTHROMBIN TIME + INR
INR: 1.4 — ABNORMAL HIGH (ref 0.9–1.1)
Prothrombin time: 14.8 s — ABNORMAL HIGH (ref 9.4–11.7)

## 2012-06-23 LAB — HEMOGLOBIN: HGB: 11.7 g/dL — ABNORMAL LOW (ref 12.1–17.0)

## 2012-06-23 MED ORDER — CELECOXIB 200 MG CAP
200 mg | ORAL_CAPSULE | Freq: Two times a day (BID) | ORAL | Status: AC
Start: 2012-06-23 — End: 2012-07-23

## 2012-06-23 MED ORDER — HYDROCODONE-ACETAMINOPHEN 5 MG-325 MG TAB
5-325 mg | ORAL_TABLET | ORAL | Status: DC | PRN
Start: 2012-06-23 — End: 2017-12-24

## 2012-06-23 MED ORDER — WARFARIN 2 MG TAB
2 mg | ORAL_TABLET | Freq: Every day | ORAL | Status: DC
Start: 2012-06-23 — End: 2012-06-23

## 2012-06-23 MED ORDER — CELECOXIB 200 MG CAP
200 mg | ORAL_CAPSULE | Freq: Two times a day (BID) | ORAL | Status: DC
Start: 2012-06-23 — End: 2012-06-23

## 2012-06-23 MED ORDER — WARFARIN 2 MG TAB
2 mg | ORAL_TABLET | Freq: Every day | ORAL | Status: DC
Start: 2012-06-23 — End: 2017-12-20

## 2012-06-23 MED ORDER — HYDROCODONE-ACETAMINOPHEN 5 MG-325 MG TAB
5-325 mg | ORAL_TABLET | ORAL | Status: DC | PRN
Start: 2012-06-23 — End: 2012-06-23

## 2012-06-23 MED ADMIN — warfarin (COUMADIN) tablet 3 mg: ORAL | @ 16:00:00 | NDC 00056017001

## 2012-06-23 MED ADMIN — celecoxib (CELEBREX) capsule 200 mg: ORAL | @ 14:00:00 | NDC 00025152534

## 2012-06-23 MED ADMIN — acetaminophen (TYLENOL) tablet 650 mg: ORAL | @ 08:00:00 | NDC 50580050130

## 2012-06-23 MED ADMIN — sodium chloride (NS) flush 5-10 mL: INTRAVENOUS | @ 02:00:00 | NDC 82903065462

## 2012-06-23 MED ADMIN — sodium chloride (NS) flush 5-10 mL: INTRAVENOUS | @ 10:00:00 | NDC 87701099893

## 2012-06-23 MED ADMIN — senna-docusate (PERICOLACE) 8.6-50 mg per tablet 1 Tab: ORAL | @ 02:00:00 | NDC 00904551280

## 2012-06-23 MED ADMIN — acetaminophen (TYLENOL) tablet 650 mg: ORAL | @ 02:00:00 | NDC 50580050130

## 2012-06-23 MED FILL — CELEBREX 200 MG CAPSULE: 200 mg | ORAL | Qty: 1

## 2012-06-23 MED FILL — ACETAMINOPHEN 325 MG TABLET: 325 mg | ORAL | Qty: 2

## 2012-06-23 MED FILL — WARFARIN 1 MG TAB: 1 mg | ORAL | Qty: 1

## 2012-06-23 MED FILL — SENNA PLUS 8.6 MG-50 MG TABLET: ORAL | Qty: 1

## 2012-06-23 MED FILL — BD POSIFLUSH NORMAL SALINE 0.9 % INJECTION SYRINGE: INTRAMUSCULAR | Qty: 10

## 2012-06-23 NOTE — Progress Notes (Signed)
Problem: Mobility Impaired (Adult and Pediatric)  Goal: *Acute Goals and Plan of Care (Insert Text)  Physical Therapy Goals  Initiated 06/22/2012    1. Patient will move from supine to sit and sit to supine in bed with independence within 4 days.  2. Patient will perform sit to stand with modified independence within 4 days.  3. Patient will ambulate with modified independence for 200 feet with the least restrictive device within 4 days.  4. Patient will ascend/descend 3 stairs with use of handrail(s) with modified independence within 4 days.  5. Patient will perform hip home exercise program per protocol with independence within 4 days.  PHYSICAL THERAPY TREATMENT:  JOINT REPLACEMENT EXERCISE GROUP  Patient: [name] (78 y.o. male)  Date: 06/23/2012  Diagnosis: OSTEOARTHRITIS, HIP PAIN  OSTEOARTHRITIS, HIP PAIN <principal problem not specified>  Procedure(s) (LRB):  HIP ARTHROPLASTY TOTAL ANTERIOR APPROACH (Right) 2 Days Post-Op  Precautions: WBAT  ASSESSMENT:   Pt is remarkably well with all seated and standing exercises.  Pt completed stair training and was stable and secure with safe transition of steps.  Pt (and coach) received home safety and fall prevention education and is cleared for discharge from a PT perspective.  Progression toward goals:  [X]     Improving appropriately and progressing toward goals  [ ]     Improving slowly and progressing toward goals  [ ]     Not making progress toward goals and plan of care will be adjusted       PLAN:   Patient continues to benefit from skilled intervention to address the above impairments.  Continue treatment per established plan of care.       SUBJECTIVE:   Patient stated ???I'm ready to go home today.???      OBJECTIVE DATA SUMMARY:   Patient attended group exercise class with 4 participants.  Patient performed the following therapeutic exercises:      SEATED  EXERCISES   Reps   Active   Active Assist   Ankle Pumps 10 [X]                                              [  ]                                               Knee Extension 10 [X]                                              [ ]                                                Knee Flexion 10 [X]                                              [ ]   Hip Flexion 10 [X]                                              [ ]                                                Glut Sets 10 [X]                                              [ ]                                                Arm Chair Push Ups 10 [X]                                              [ ]                                                Upper extremity general range of motion exercises 10 [X]                                              [ ]                                                    STANDING  EXERCISES   Reps   Active   Active Assist   Heel Raises 10 [X]                                              [ ]                                                Mini Squats 10 [X]                                              [ ]   Marching 10 [X]                                              [ ]                                                Hip flexion/extension with flexed knee (heel/toe taps) 10 [X]                                              [ ]                                                                    Hamstring curls 10 [X]          [ ]            Hip abduction/adduction 10 [X]          [ ]            Unsupported standing unilaterally 10 [X]          [ ]            Unsupported standing bilaterally 10 [X]          [ ]              Ambulation:  Distance ambulated: 37' x 2  Assistive device used: Rolling walker, Gait belt  Level of assistance required: Supervision  Gait abnormalities observed: Antalgic  Stair Training:  Number of steps trained: 4  Number of rails:  1  Assistive device used: Cane  Level of assistance required: CGA  Education:  ( x )  Home safety education was provided/reviewed.  ( x )   Education regarding car transfer techniques was provided/reviewed.  ( x )  Patient was able to state and demonstrate precautions independently.  Pain:  Pain Scale 1: Numeric (0 - 10)  Pain Intensity 1: 2  Pain Location 1: Hip  Pain Orientation 1: Left  Pain Description 1: Aching  Pain Intervention(s) 1: Medication (see MAR)  Activity Tolerance:   No apparent distress.    After treatment, patient returned to room and:   [X]         Patient left in no apparent distress sitting up in chair  [ ]         Patient left in no apparent distress in bed  [X]         Call bell left within reach  [X]         Nursing notified  [X]         Caregiver present  [ ]         Bed alarm activated  [X]         Ice pack applied to the affected area    Cherlyn Labella, PTA   Time Calculation: 45 mins

## 2012-06-23 NOTE — Progress Notes (Signed)
Problem: Self Care Deficits Care Plan (Adult)  Goal: *Acute Goals and Plan of Care (Insert Text)  Occupational Therapy Goals  Initiated: 06/22/12  1. Patient will perform grooming with supervision/set-up standing at sink within 3 day(s).  2. Patient will perform bathing with supervision/set-up from chair within 3 day(s).  3. Patient will perform upper body dressing and lower body dressing with supervision/set-up within 3 day(s).  4. Patient will perform toilet transfers with supervision/set-up within 3 day(s).  5. Patient will perform all aspects of toileting with supervision/set-up within 3 day(s).  6. Patient will be independent with anterior hip precautions within 3 days.  OCCUPATIONAL THERAPY TREATMENT/DISCHARGE  Patient: [name] (78 y.o. male)  Date: 06/23/2012  Diagnosis: OSTEOARTHRITIS, HIP PAIN  OSTEOARTHRITIS, HIP PAIN <principal problem not specified>  Procedure(s) (LRB):  HIP ARTHROPLASTY TOTAL ANTERIOR APPROACH (Right) 2 Days Post-Op  Precautions: WBAT      ASSESSMENT:   Pt received sitting in chair dressed and ready for class this am.  Pt agreeable to work with AE for practice this am.  Referred to pre-operative education book and showed pt and coach where information is located in book.  Pt required cues for problem solving but overall did well with all tasks.  Pt will continue to have assistance at home from coach.  Pt safe to discharge home with family assistance.  Referred further questions/concerns to home health therapy.    Progression toward goals:  [X]             Improving appropriately and progressing toward goals  [ ]             Improving slowly and progressing toward goals  [ ]             Not making progress toward goals and plan of care will be adjusted       PLAN:   Patient will be discharged from occupational therapy at this time.  Rationale for discharge:  [ ]    Goals Achieved  [X]    Plateau Reached  [ ]    Patient not participating in therapy  [ ]    Other:  Discharge Recommendations:   Home Health  Further Equipment Recommendations for Discharge:  None needed; obtained hip kit yesterday from [facility] [phone]       SUBJECTIVE:   Patient stated ???I am feeling stiff this morning.???      OBJECTIVE DATA SUMMARY:   Cognitive/Behavioral Status:  Neurologic State: Alert  Orientation Level: Oriented X4  Cognition: Appropriate decision making  Perception: Appears intact  Perseveration: No perseveration noted  Safety/Judgement: Good awareness of safety precautions  Functional Mobility and Transfers for ADLs:  Bed Mobility:     Supine to Sit:  (recieved in chair)  Sit to Supine:  (remained in chair in group setting (post op class))     Transfers:  Sit to Stand: Supervision     Bed to Chair: Supervision                                Toilet Transfer : Supervision/set up                                                              Balance:  Sitting: Intact  Standing: Intact;With support  ADL Intervention:   IADL training:   Discussed at length precautions with IADL tasks.  Discussed body alignment and ensuring pt does not twist hips/knees to ensure proper body alignment.  Discussed finger tip rule for daily activities and to use a reacher for all tasks that are out of reach.  Pt discussed to avoid tasks such as sweeping, mopping, vacuuming, changing bed linens, carrying a laundry basket, reaching into a low oven, or cleaning showers and toilets.  Pt verbalized understanding of instructions.  Did encourage pt to stand at sink for grooming, washing dishes, and light meal preparations to increase overall standing tolerance and independence with all activities.     Tub/Shower transfers:   Discussed technique for transferring into a tub/shower combos . Pt educated to step in with strong leg and to come out with operated leg to ensure safety with task.  Pt instructed to have a family member or friend to assist pt when they attempt to get in the shower for the first time.  Pt educated they can use the wall for steady  balance as needed.  Discussed technique to bring operated leg into the shower and provided demonstration for task to avoid hyperextension.  Pt reporting understanding of training.     Upper Body Dressing Assistance  Dressing Assistance: Supervision/set-up  Pullover Shirt: Supervision/set-up    Lower Body Dressing Assistance  Dressing Assistance: Supervision/set up  Underpants: Supervision/set-up  Pants With Elastic Waist: Supervision/set-up  Socks: Supervision/set-up  Shoes with Elastic Laces:  (educated for how to use elastic laces for home)  Slip on Shoes with Back: Supervision/set-up  Leg Crossed Method Used: No  Position Performed: Seated in chair  Adaptive Equipment Used: Long handled shoe horn;Reacher;Sock aid;Dressing stick         Cognitive Retraining  Safety/Judgement: Good awareness of safety precautions    Pain:  Pain Scale 1: Numeric (0 - 10)  Pain Intensity 1: 2  Pain Location 1: Hip  Pain Orientation 1: Left  Pain Description 1: Aching  Pain Intervention(s) 1: Medication (see MAR)  Activity Tolerance:   VSS throughout session.     After treatment:   [X]  Patient left in no apparent distress sitting up in chair in group class  [ ]  Patient left in no apparent distress in bed  [X]  Call bell left within reach  [X]  Nursing notified  [ ]  Caregiver present  [ ]  Bed alarm activated      COMMUNICATION/COLLABORATION:   The patient???s plan of care was discussed with: Physical Therapist and Registered Nurse    Santa Lighter, OT  Time Calculation: 28 mins

## 2012-06-23 NOTE — Progress Notes (Signed)
Discharge via wc with all belongings, accompanied to car by nsg and family.

## 2012-06-23 NOTE — Progress Notes (Signed)
Patient and family  instructed in all discharge instructions, medication, wound care home care, anticoagulant precautions, all verbalized understanding of all information. Given copoies of all information and rxs for home. Home care is arranged.

## 2012-06-23 NOTE — Progress Notes (Signed)
Orthopedic Joint Progress Note    June 23, 2012  Admit Date: 06/21/2012  Admit Diagnosis: OSTEOARTHRITIS, HIP PAIN  OSTEOARTHRITIS, HIP PAIN    Post Op day: 2 Days Post-Op    Subjective:     [name] feels good this morning. Minor pain. Dressed and ready to go home today following PT. No new issues.  Tolerating diet  Denies N/V/SOB or CP      Objective:     PT/OT:     PATIENT MOBILITY    Bed Mobility Training  Supine to Sit: Modified independence, requires equipment  Sit to Supine:  (OOB to chair)  Transfer Training  Sit to Stand: Supervision  Stand to Sit: Supervision  Bed to Chair: CGA      Gait Training  Assistive Device: Walker, rolling;Gait belt  Ambulation - Level of Assistance: CGA;SBA  Distance (ft): 100 Feet (ft)   Weight Bearing Status  Right Side Weight Bearing: As tolerated        Vital Signs:    Blood pressure 142/64, pulse 62, temperature 98 ??F (36.7 ??C), resp. rate 16, height 6' (1.829 m), weight 74.844 kg (165 lb), SpO2 97.00%.  Temp (24hrs), Avg:98.2 ??F (36.8 ??C), Min:98 ??F (36.7 ??C), Max:98.4 ??F (36.9 ??C)      Pain Control:   Pain Assessment  Pain Scale 1: Numeric (0 - 10)  Pain Intensity 1: 2  Pain Location 1: Hip  Pain Orientation 1: Left  Pain Description 1: Aching  Pain Intervention(s) 1: Medication (see MAR)    Meds:  Current Facility-Administered Medications   Medication Dose Route Frequency   ??? warfarin (COUMADIN) tablet 4 mg  4 mg Oral ONCE   ??? acetaminophen (OFIRMEV) infusion 1,000 mg  1,000 mg IntraVENous Q6H   ??? morphine (PF) PCA 150 mg/30 ml   IntraVENous TITRATE   ??? 0.9% sodium chloride infusion  125 mL/hr IntraVENous CONTINUOUS   ??? sodium chloride (NS) flush 5-10 mL  5-10 mL IntraVENous Q8H   ??? sodium chloride (NS) flush 5-10 mL  5-10 mL IntraVENous PRN   ??? acetaminophen (TYLENOL) tablet 650 mg  650 mg Oral Q4H PRN   ??? celecoxib (CELEBREX) capsule 200 mg  200 mg Oral DAILY   ??? oxyCODONE IR (ROXICODONE) tablet 5-10 mg  5-10 mg Oral Q3H PRN   ??? HYDROcodone-acetaminophen (NORCO) 5-325  mg per tablet 1 Tab  1 Tab Oral Q4H PRN   ??? HYDROcodone-acetaminophen (NORCO) 7.5-325 mg per tablet 1 Tab  1 Tab Oral Q4H PRN   ??? morphine injection 2 mg  2 mg IntraVENous Q3H PRN   ??? naloxone (NARCAN) injection 0.4 mg  0.4 mg IntraVENous PRN   ??? ondansetron (ZOFRAN) injection 4 mg  4 mg IntraVENous Q4H PRN   ??? ondansetron (ZOFRAN ODT) tablet 4 mg  4 mg Oral Q4H PRN   ??? senna-docusate (PERICOLACE) 8.6-50 mg per tablet 1 Tab  1 Tab Oral QHS   ??? magnesium hydroxide (MILK OF MAGNESIA) oral suspension 30 mL  30 mL Oral ONCE   ??? magnesium hydroxide (MILK OF MAGNESIA) oral suspension 30 mL  30 mL Oral DAILY PRN   ??? bisacodyl (DULCOLAX) suppository 10 mg  10 mg Rectal DAILY PRN   ??? ketorolac (TORADOL) injection 15 mg  15 mg IntraVENous Q6H PRN   ??? diphenhydrAMINE (BENADRYL) injection 25 mg  25 mg IntraVENous Q4H PRN   ??? diphenhydrAMINE (BENADRYL) capsule 25 mg  25 mg Oral Q4H PRN   ??? Warfarin dosing per  pharmacy  1 Each Other DAILY        LAB:    Lab Results   Component Value Date/Time    INR 1.4 06/23/2012  4:15 AM    INR 1.1 06/22/2012  3:48 AM    INR 1.0 05/27/2012  9:00 AM     Lab Results   Component Value Date/Time    HGB 11.7 06/23/2012  4:15 AM    HGB 13.0 06/22/2012  3:48 AM    HGB 14.9 05/27/2012  9:00 AM         Dressing:      Wound:       Physical Exam:    Dressing is clean, dry, and intact  Neurovascular checks within normal limits  Orientation:  Oriented    Assessment:      Active Problems:   * No active hospital problems. *        Plan:     Continue PT/OT/Rehab  Consult: Rehab team including PT, OT, recreational therapy, and social services  Continue established pain management  Continue DVT prophylaxis    Discharge To:  to home health today       Signed By: Genene Churn. Daiva Huge, PA

## 2012-06-23 NOTE — Other (Signed)
Bedside and Verbal shift change report given to Margit Banda, RN (oncoming nurse) by Chyrl Civatte, RN (offgoing nurse).  Report given with SBAR, Kardex, Procedure Summary, Intake/Output, MAR and Accordion.

## 2012-07-02 NOTE — Telephone Encounter (Signed)
Post Discharge Follow-up contact after Joint Replacement-  Patient returned my call    Patient discharged on 06/23/12   following  right Hip Arthroplasty.   Spoke with patient today, who reports they are " doing fine."  Denies Fever, Shortness of Breath or Chest Pain.  Patient also reports:.    Incision  clean, dry, intact  Calf is non-tender,   operative extremity has minimal swelling.  Pain is well managed.  Discussed use of ice & elevation.  is progressing with therapy and isexercising on own.  Taking coumadin for anticoagulation, Norco for pain,   has not resumed PTA medications as he was on none![name]

## 2014-12-25 ENCOUNTER — Inpatient Hospital Stay: Admit: 2014-12-25 | Payer: MEDICARE | Primary: Family

## 2014-12-25 ENCOUNTER — Encounter

## 2014-12-25 DIAGNOSIS — J209 Acute bronchitis, unspecified: Secondary | ICD-10-CM

## 2016-12-11 ENCOUNTER — Ambulatory Visit (INDEPENDENT_AMBULATORY_CARE_PROVIDER_SITE_OTHER): Payer: Medicare Other

## 2016-12-11 ENCOUNTER — Ambulatory Visit (INDEPENDENT_AMBULATORY_CARE_PROVIDER_SITE_OTHER): Payer: Medicare Other | Admitting: Physician Assistant

## 2016-12-11 VITALS — BP 128/80 | HR 80 | Temp 97.9°F | Resp 16 | Ht 72.0 in | Wt 195.0 lb

## 2016-12-11 DIAGNOSIS — R0781 Pleurodynia: Secondary | ICD-10-CM

## 2016-12-11 NOTE — Progress Notes (Signed)
   12/11/2016 11:34 AM   DOB: 1932-08-11 / MRN: [id number]  SUBJECTIVE:  Isaiah Short is a 82 y.o. male presenting for right lower rib pain after falling against a table yesterday.  Sitting up and rolling over makes the pain worse. Deep breathing makes the pain worse. Denies SOB.   He has no allergies on file.   He  has a past medical history of Arthritis.    He  reports that he has never smoked. He has never used smokeless tobacco. He reports that he drinks about 3.0 oz of alcohol per week . He reports that he does not use drugs.   Review of Systems  Constitutional: Negative for chills and fever.  Respiratory: Negative for cough, sputum production and shortness of breath.   Cardiovascular: Negative for leg swelling.  Gastrointestinal: Negative for nausea.  Skin: Negative for itching and rash.  Neurological: Negative for dizziness.    The problem list and medications were reviewed and updated by myself where necessary and exist elsewhere in the encounter.   OBJECTIVE:  BP 128/80 (BP Location: Right Arm, Patient Position: Sitting, Cuff Size: Normal)   Pulse 80   Temp 97.9 F (36.6 C) (Oral)   Resp 16   Ht 6' (1.829 m)   Wt 195 lb (88.5 kg)   SpO2 97%   BMI 26.45 kg/m   Physical Exam  Constitutional: He is oriented to person, place, and time. He appears well-developed and well-nourished. He does not appear ill.  Eyes: Conjunctivae and EOM are normal. Pupils are equal, round, and reactive to light.  Cardiovascular: Normal rate and regular rhythm.   Pulmonary/Chest: Effort normal and breath sounds normal. No respiratory distress. He has no rales.  Abdominal: He exhibits no distension.  Musculoskeletal: Normal range of motion. He exhibits no edema, tenderness or deformity.  Neurological: He is alert and oriented to person, place, and time. No cranial nerve deficit. Coordination normal.  Skin: Skin is warm and dry. He is not diaphoretic.  Psychiatric: He has a normal mood and  affect.  Nursing note and vitals reviewed.   No results found for this or any previous visit (from the past 72 hour(s)).  Dg Ribs Unilateral Right  Result Date: 12/11/2016 CLINICAL DATA:  Fall yesterday.  Rib pain. EXAM: RIGHT RIBS - 2 VIEW COMPARISON:  None. FINDINGS: No fracture or other bone lesions are seen involving the ribs. Heart is mildly enlarged. Lungs are clear. No effusions or pneumothorax. IMPRESSION: No visible rib fracture. Borderline heart size. Electronically Signed   By: Charlett NoseKevin  Dover M.D.   On: 12/11/2016 11:17    ASSESSMENT AND PLAN  Isaiah CountsBob was seen today for side injury.  Diagnoses and all orders for this visit:  Rib tenderness: rads negative.  See AVS for anticipatory guidance.  Comments: About the right free ribs.  Lungs clear to auscultation.  Orders: -     DG Ribs Unilateral Right; Future    The patient is advised to call or return to clinic if he does not see an improvement in symptoms, or to seek the care of the closest emergency department if he worsens with the above plan.   Deliah BostonMichael Geroldine Esquivias, MHS, PA-C [facility] 12/11/2016 11:34 AM

## 2016-12-11 NOTE — Patient Instructions (Signed)
Take 1000 mg of Tylenol every 8 hours for base pain control for the next few days.  Okay to take 400 mg of Ibuprofen every 6 hours if needed but not more and not for longer than 1 week.

## 2017-12-19 ENCOUNTER — Inpatient Hospital Stay

## 2017-12-19 ENCOUNTER — Emergency Department: Admit: 2017-12-19 | Payer: MEDICARE | Primary: Family

## 2017-12-19 ENCOUNTER — Inpatient Hospital Stay
Admit: 2017-12-19 | Discharge: 2017-12-24 | Disposition: A | Discharge status: Rehabilitation Facility | Payer: MEDICARE | Attending: Hospitalist | Admitting: Hospitalist

## 2017-12-19 DIAGNOSIS — I63512 Cerebral infarction due to unspecified occlusion or stenosis of left middle cerebral artery: Secondary | ICD-10-CM

## 2017-12-19 LAB — CBC WITH AUTOMATED DIFF
ABS. BASOPHILS: 0 10*3/uL (ref 0.0–0.1)
ABS. EOSINOPHILS: 0.2 10*3/uL (ref 0.0–0.4)
ABS. IMM. GRANS.: 0 10*3/uL (ref 0.00–0.04)
ABS. LYMPHOCYTES: 1.3 10*3/uL (ref 0.8–3.5)
ABS. MONOCYTES: 0.5 10*3/uL (ref 0.0–1.0)
ABS. NEUTROPHILS: 3.8 10*3/uL (ref 1.8–8.0)
ABSOLUTE NRBC: 0 10*3/uL (ref 0.00–0.01)
BASOPHILS: 1 % (ref 0–1)
EOSINOPHILS: 3 % (ref 0–7)
HCT: 44.5 % (ref 36.6–50.3)
HGB: 14.5 g/dL (ref 12.1–17.0)
IMMATURE GRANULOCYTES: 0 % (ref 0.0–0.5)
LYMPHOCYTES: 22 % (ref 12–49)
MCH: 33.1 PG (ref 26.0–34.0)
MCHC: 32.6 g/dL (ref 30.0–36.5)
MCV: 101.6 FL — ABNORMAL HIGH (ref 80.0–99.0)
MONOCYTES: 9 % (ref 5–13)
MPV: 11.3 FL (ref 8.9–12.9)
NEUTROPHILS: 65 % (ref 32–75)
NRBC: 0 PER 100 WBC
PLATELET: 91 10*3/uL — ABNORMAL LOW (ref 150–400)
RBC: 4.38 M/uL (ref 4.10–5.70)
RDW: 13.5 % (ref 11.5–14.5)
WBC: 5.8 10*3/uL (ref 4.1–11.1)

## 2017-12-19 LAB — SAMPLES BEING HELD

## 2017-12-19 LAB — METABOLIC PANEL, COMPREHENSIVE
A-G Ratio: 1 — ABNORMAL LOW (ref 1.1–2.2)
ALT (SGPT): 42 U/L (ref 12–78)
AST (SGOT): 38 U/L — ABNORMAL HIGH (ref 15–37)
Albumin: 3.5 g/dL (ref 3.5–5.0)
Alk. phosphatase: 61 U/L (ref 45–117)
Anion gap: 8 mmol/L (ref 5–15)
BUN/Creatinine ratio: 12 (ref 12–20)
BUN: 12 MG/DL (ref 6–20)
Bilirubin, total: 1 MG/DL (ref 0.2–1.0)
CO2: 26 mmol/L (ref 21–32)
Calcium: 8.8 MG/DL (ref 8.5–10.1)
Chloride: 103 mmol/L (ref 97–108)
Creatinine: 1.04 MG/DL (ref 0.70–1.30)
GFR est AA: >60 mL/min/{1.73_m2} (ref >60)
GFR est non-AA: >60 mL/min/{1.73_m2} (ref >60)
Globulin: 3.4 g/dL (ref 2.0–4.0)
Glucose: 107 mg/dL — ABNORMAL HIGH (ref 65–100)
Potassium: 3.9 mmol/L (ref 3.5–5.1)
Protein, total: 6.9 g/dL (ref 6.4–8.2)
Sodium: 137 mmol/L (ref 136–145)

## 2017-12-19 LAB — PTT: aPTT: 29.4 s (ref 22.1–32.0)

## 2017-12-19 MED ORDER — SODIUM CHLORIDE 0.9% BOLUS IV
0.9 % | Freq: Once | INTRAVENOUS | Status: AC
Start: 2017-12-19 — End: 2017-12-19
  Administered 2017-12-19: 23:00:00 via INTRAVENOUS

## 2017-12-19 MED ORDER — IOPAMIDOL 76 % IV SOLN
370 mg iodine /mL (76 %) | Freq: Once | INTRAVENOUS | Status: AC
Start: 2017-12-19 — End: 2017-12-19
  Administered 2017-12-19: 23:00:00 via INTRAVENOUS

## 2017-12-19 MED ORDER — SODIUM CHLORIDE 0.9 % IJ SYRG
Freq: Once | INTRAMUSCULAR | Status: AC
Start: 2017-12-19 — End: 2017-12-19
  Administered 2017-12-19: 23:00:00 via INTRAVENOUS

## 2017-12-19 NOTE — ED Notes (Signed)
Assumed care of pt from [place], [place].

## 2017-12-19 NOTE — ED Notes (Signed)
TRANSFER - OUT REPORT:    Verbal report given to Andy(name) on [name]  being transferred to ICU(unit) for routine progression of care       Report consisted of patient???s Situation, Background, Assessment and   Recommendations(SBAR).     Information from the following report(s) SBAR, ED Summary and Cardiac Rhythm NSR w/PVCs was reviewed with the receiving nurse.    Lines:   Peripheral IV 12/19/17 Left Antecubital (Active)   Site Assessment Clean, dry, & intact 12/19/2017  5:20 PM   Phlebitis Assessment 0 12/19/2017  5:20 PM   Infiltration Assessment 0 12/19/2017  5:20 PM   Dressing Status Clean, dry, & intact 12/19/2017  5:20 PM   Hub Color/Line Status Pink 12/19/2017  5:20 PM        Opportunity for questions and clarification was provided.      Patient transported with:   Monitor  Registered Nurse

## 2017-12-19 NOTE — ED Notes (Signed)
Bedside shift change report given to Sam,RN (oncoming nurse) by Marin OlpLexie,RN (offgoing nurse). Report included the following information SBAR, ED Summary and Recent Results.

## 2017-12-19 NOTE — ED Notes (Signed)
Pt ambulatory to bathroom with RN

## 2017-12-19 NOTE — Other (Signed)
2204 Report received from Lawerance BachSamara Wilder RN using SBAR format

## 2017-12-19 NOTE — ED Provider Notes (Addendum)
84 y.o. male with past medical history significant for Arthritis who presents via EMS with chief complaint of slurred speech. Patient's LKW 1515. Patient states onset a couple of months ago of episodic slurred speech, gait difficulty, and dizziness. Patient reports having an episode of slurred speech, gait difficulty, and dizziness earlier today and per his neighbor has accompanying right sided facial droop. Patient was seen at [facility] prior to arrival, and was referred to [facility] ED for further evaluation. Per EMS, en route to [facility] ED patient had a witnessed episode of slurred speech that was "completely incomprehensible" and right sided facial droop both of which resolved. Patient presents to [facility] ED with word finding difficulties, but right sided droop has since resolved. Patient denies any ongoing health issues or taking medications daily. Pt denies vision changes, numbness, weakness, fever, chills, cough, congestion, shortness of breath, chest pain, abdominal pain, nausea, vomiting, diarrhea, difficulty with urination or dysuria.     There are no other acute medical concerns at this time.    Note written by Regis Bill, Scribe, as dictated by Kennadi Albany, Marye Round, MD 5:31 PM        The history is provided by the patient and the EMS personnel.        Past Medical History:   Diagnosis Date   ??? Arthritis    ??? Chronic pain     RIGHT HIP       History reviewed. No pertinent surgical history.      Family History:   Problem Relation Age of Onset   ??? Cancer Mother         BREAST CA   ??? Heart Disease Father        Social History     Socioeconomic History   ??? Marital status: WIDOWED     Spouse name: Not on file   ??? Number of children: Not on file   ??? Years of education: Not on file   ??? Highest education level: Not on file   Social Needs   ??? Financial resource strain: Not on file   ??? Food insecurity - worry: Not on file   ??? Food insecurity - inability: Not on file   ??? Transportation needs - medical: Not on file    ??? Transportation needs - non-medical: Not on file   Occupational History   ??? Not on file   Tobacco Use   ??? Smoking status: Never Smoker   ??? Smokeless tobacco: Never Used   Substance and Sexual Activity   ??? Alcohol use: Yes     Comment: 2/WEEK VODKA   ??? Drug use: No   ??? Sexual activity: Not on file   Other Topics Concern   ??? Not on file   Social History Narrative   ??? Not on file         ALLERGIES: Patient has no known allergies.    Review of Systems   Constitutional: Negative for activity change, chills and fever.   HENT: Negative for congestion, nosebleeds, sore throat, trouble swallowing and voice change.    Eyes: Negative for visual disturbance.   Respiratory: Negative for cough and shortness of breath.    Cardiovascular: Negative for chest pain and palpitations.   Gastrointestinal: Negative for abdominal pain, constipation, diarrhea and nausea.   Genitourinary: Negative for difficulty urinating, dysuria, hematuria and urgency.   Musculoskeletal: Negative for back pain, neck pain and neck stiffness.   Skin: Negative for color change.   Allergic/Immunologic: Negative for immunocompromised  state.   Neurological: Positive for speech difficulty. Negative for dizziness, seizures, syncope, weakness, light-headedness, numbness and headaches.   Psychiatric/Behavioral: Negative for behavioral problems, confusion, hallucinations, self-injury and suicidal ideas.   All other systems reviewed and are negative.      There were no vitals filed for this visit.         Physical Exam   Constitutional: He is oriented to person, place, and time. He appears well-developed and well-nourished. No distress.   HENT:   Head: Normocephalic and atraumatic.   Eyes: Pupils are equal, round, and reactive to light.   Neck: Normal range of motion. Neck supple.   Cardiovascular: Normal rate, regular rhythm and normal heart sounds. Exam reveals no gallop and no friction rub.   No murmur heard.   Pulmonary/Chest: Effort normal and breath sounds normal. No respiratory distress. He has no wheezes.   Abdominal: Soft. Bowel sounds are normal. He exhibits no distension. There is no tenderness. There is no rebound and no guarding.   Musculoskeletal: Normal range of motion.   Neurological: He is alert and oriented to person, place, and time.   Mild speech finding difficulties, nonfocal neurologic exam   Skin: Skin is warm. No rash noted. He is not diaphoretic.   Psychiatric: He has a normal mood and affect. His behavior is normal. Judgment and thought content normal.   Nursing note and vitals reviewed.  Note written by Regis Bill, Scribe, as dictated by Geovannie Vilar, Marye Round, MD 5:31 PM       MDM     This is an 84 year old male with past medical history, review of systems, physical exam as above, presenting via EMS for complaints of acute onset mental status changes. Patient is a poor historian, noted that his episodes of dizziness, and speech difficulties have been intermittent for "months".  Patient states he is not received prior evaluation, states that he went to urgent care today, at the past his friends who thought his symptoms were becoming worse. Upon arrival patient is awake, alert, oriented, with mild speech finding difficulties, otherwise nonfocal neurologic exam, regular rate, regular rhythm without murmurs gallops or rubs, soft nontender abdomen. Plan to proceed with urgent workup for CVA versus TIA, with CT, CTA, CT perfusion, CMP, CBC, coags. We'll obtain consultation with tele-neurology, we will make a disposition based the patient's diagnostics and response to therapy.    Procedures   CONSULT NOTE:  5:42 PM Vinh Sachs, Marye Round, MD spoke with Dr. Allyne Gee, Consult for Teleneurology.  Discussed available diagnostic tests and clinical findings.  Dr. Allyne Gee recommends admission for evaluation, and to call him back if CTA is remarkable.     PROGRESS NOTE:  6:25 PM Reconsulted teleneurology.   6:31 PM Teleneuro will evaluate patient at bedside.    CONSULT NOTE:  7:09 PM [name] Hirst, Marye Round, MD communicated with Dr. Lum Babe, Consult for Hospitalist via [facility] Text.  Discussed available diagnostic tests and clinical findings.  Dr. Lum Babe will evaluate patient for possible admission.    7:11 PM Dr. Lum Babe will admit patient.      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    9:27 PM  Dr. Allyne Gee will consult NeuroInterventional radiology.    9:32 PM  Dr. Allyne Gee called back, Dr. Mack Guise (NeuroInterventional) is evaluating the imaging.    9:40 PM  Dr. Allyne Gee states that Dr. Mack Guise cannot intervene 2/2 size and distal nature of the obstruction,  recommends maintaining increased BP to aid perfusion, addition of ASA and Plavix, ICU admission.

## 2017-12-19 NOTE — H&P (Signed)
[place] [facility]  HISTORY AND PHYSICAL      Name:[name], Danny D  MR#: [id number]  DOB: November 17, 1932  ACCOUNT #: [id number]   ADMIT DATE: 12/19/2017    PRIMARY CARE PHYSICIAN:  None.    SOURCE OF INFORMATION:  Patient.    CHIEF COMPLAINT:  Slurred speech.    HISTORY OF PRESENT ILLNESS:  This is an 84 year old man with a past medical history significant for arthritis.  Was in his usual state of health until the day of presentation at the emergency room when patient developed slurred speech.  The patient has been having intermittent slurred speech, dizziness, gait abnormalities for months according to report, but his slurred speech got worse today, was associated with a right-sided facial droop. according to report.  The patient was taken to urgent care center.  He was sent from the urgent care center to [facility] Emergency Room for further evaluation.  EMS reported that the patient's speech on the way to the emergency room was slurred and completely incomprehensible.  A right facial droop was also observed, and the symptoms are said to have resolved completely.  When the patient arrived at the emergency room, code stroke was called.  CT scan of the head was obtained and the emergency room physician consulted the neurologist through the tele neurology service.  It was determined that the patient is not a TPA candidate.  He was subsequently referred to the hospitalist service for evaluation for admission.  Patient has no prior history of a stroke or TIA.  While the patient was in the emergency room, he developed another episode of slurred speech.  The patient's nurse reconsulted the teleneurologist.  No intervention was advised.  Patient denies associated headache.  He was admitted to this hospital about 6 years ago for right hip replacement.    PAST MEDICAL HISTORY:  Arthritis.    ALLERGIES:  NO KNOWN DRUG ALLERGIES.    MEDICATIONS:  The patient is not on any medication at home.     FAMILY HISTORY:  This was reviewed.  His father had heart disease.  His mother had breast cancer.    PAST SURGICAL HISTORY:  This is significant for right hip replacement.    SOCIAL HISTORY:  No history of tobacco abuse.  Patient admits to social consumption of alcohol.    REVIEW OF SYSTEMS:  HEAD, EYES, EARS, NOSE AND THROAT:  This is positive for difficulty with speech.  No headache, no blurring of vision.  No photophobia.  RESPIRATORY:  No cough, no shortness of breath, no hemoptysis.  CARDIOVASCULAR:  No chest pain, no orthopnea, no palpitations.  GASTROINTESTINAL:  No nausea, vomiting or diarrhea.  No constipation.  GENITOURINARY:  No dysuria, no urgency and no frequency.  All other systems are reviewed and they are negative.    PHYSICAL EXAMINATION:  GENERAL:  The patient appeared ill, in moderate distress.  VITAL SIGNS:  On arrival at the emergency room, temperature 98.2, pulse 84, respiratory rate 18, blood pressure 153/71, oxygen saturation 97% on room air.  HEAD:  Normocephalic, atraumatic.  EYES:  Normal eye movement.  No redness, no drainage, no discharge.  EARS:  Normal external ears with no obvious drainage.  NOSE:  No deformity, no drainage.  MOUTH AND THROAT:  No visible oral lesion.  NECK:  Supple, no JVD, no thyromegaly.  CHEST:  Clear breath sounds.  No wheezing, no crackles.  HEART:  Normal S1 and S2, regular.  No clinically appreciable murmur.  ABDOMEN:  Soft, nontender.  Normal bowel sounds.  CENTRAL NERVOUS SYSTEM:  Alert, oriented x3.  No gross focal neurological deficit.  EXTREMITIES:  No edema.  Pulses 2+ bilaterally.  MUSCULOSKELETAL:  No obvious joint deformity or swelling.  SKIN:  No active skin lesions seen in the exposed part of the body.  PSYCHIATRIC:  Normal mood and affect.  LYMPHATIC SYSTEM:  No cervical lymphadenopathy.    DIAGNOSTIC DATA:  EKG shows sinus rhythm, nonspecific ST-T wave abnormalities.  CT scan of the head without contrast:  No acute pathology.   CTA of the head and neck with perfusion study:  No evidence of large vessel occlusion.    LABORATORY DATA:  Hematology:  WBC 5.8, hemoglobin 14.5, hematocrit 44.5, platelet 91.  Chemistry:  Sodium 137, potassium 3.9, chloride 103, CO2 26, glucose 107, BUN 12, creatinine 1.04, calcium 8.8, total bilirubin 1.0, ALT 42, AST 38, alkaline phosphatase 61, total protein 6.9, albumin level 3.5, globulin 3.4.  Coagulation profile:  PTT 29.4.    ASSESSMENT:  1.  Suspected acute cerebrovascular accident.  2.  Elevated blood pressure.  3.  Thrombocytopenia.    PLAN:  1.  Acute CVA.  We will admit the patient for further evaluation and treatment.  Will check a lipid profile.  Will check a hemoglobin A1c level.  The patient received aspirin already.  Will hold further aspirin therapy until when the patient is seen by the inpatient neurologist because of the significant thrombocytopenia.  2.  We will obtain MRI of the brain, echocardiogram.  Inpatient neurology consult will be requested to assist in further evaluation and treatment.  3.  Elevated blood pressure.  The patient has no history of hypertension.  We will monitor the patient's blood pressure closely.  If the average blood pressure readings remain elevated, we will initiate antihypertensive medication for new onset essential hypertension.  We will check a TSH level.   4.  Thrombocytopenia.  The etiology is not clear at this time.  As patient is asymptomatic, will monitor the patient's platelet count.  5.  Other issues:  CODE STATUS:  THE PATIENT IS A FULL CODE.  We will request SCD for DVT prophylaxis.      Sander NephewAZAAK Tyyonna Soucy, MD       RE/SN  D: 12/19/2017 20:28     T: 12/19/2017 21:04  JOB #: [id number]

## 2017-12-19 NOTE — ED Notes (Signed)
ED EKG interpretation:  Rhythm: sinus rhythm with PVCs; and regular . Rate (approx.): 91 bpm; ST/T wave: ST depressions in V1-V3.  Note written by Regis BillKelsey Stroud, Scribe, as dictated by Myran Arcia, Marye RoundFrancis J, MD 6:16 PM

## 2017-12-19 NOTE — ED Triage Notes (Signed)
EMS picked pt up at [facility] for slurred speech and slight right sided droop that resolved. Pt has a history of this off and on for the past 6 months.

## 2017-12-19 NOTE — ED Notes (Signed)
TRANSFER - OUT REPORT:    Verbal report given to Ty(name) on [name]  being transferred to [facility](unit) for routine progression of care       Report consisted of patient???s Situation, Background, Assessment and   Recommendations(SBAR).     Information from the following report(s) SBAR, ED Summary and Cardiac Rhythm NSR w/PVCs was reviewed with the receiving nurse.    Lines:   Peripheral IV 12/19/17 Left Antecubital (Active)   Site Assessment Clean, dry, & intact 12/19/2017  5:20 PM   Phlebitis Assessment 0 12/19/2017  5:20 PM   Infiltration Assessment 0 12/19/2017  5:20 PM   Dressing Status Clean, dry, & intact 12/19/2017  5:20 PM   Hub Color/Line Status Pink 12/19/2017  5:20 PM        Opportunity for questions and clarification was provided.      Patient transported with:   Monitor  Registered Nurse

## 2017-12-19 NOTE — Progress Notes (Signed)
Attempted to call report. Told by ER secretary that nurse was giving report on another patient and would call me back when she finishes.

## 2017-12-19 NOTE — ED Notes (Signed)
Dr Allyne GeeSanders, tele neurologist, on monitor at bedside.

## 2017-12-19 NOTE — ED Notes (Addendum)
RN went into pt's room to assist him in moving up in bed. Pt had noticeable decline in speech/comprehension. RN paged ER MD/Teleneuro Dr. Allyne GeeSanders.    19:40  Dr. Allyne GeeSanders, tele neurologist, on monitor at bedside for reeval.    19:55  Pt's language returned to clear and comprehensible.. Documented in freq neuro flow sheet. Will continue to monitor.

## 2017-12-19 NOTE — H&P (Signed)
H&P dictated [id number]

## 2017-12-20 ENCOUNTER — Inpatient Hospital Stay: Admit: 2017-12-20 | Payer: MEDICARE | Primary: Family

## 2017-12-20 LAB — EKG 12-LEAD
Atrial Rate: 91 {beats}/min
P-R Interval: 80 ms
Q-T Interval: 378 ms
QRS Duration: 112 ms
QTc Calculation (Bazett): 464 ms
R Axis: 99 degrees
T Axis: -80 degrees
Ventricular Rate: 91 {beats}/min

## 2017-12-20 LAB — METABOLIC PANEL, COMPREHENSIVE
A-G Ratio: 1.1 (ref 1.1–2.2)
ALT (SGPT): 41 U/L (ref 12–78)
AST (SGOT): 39 U/L — ABNORMAL HIGH (ref 15–37)
Albumin: 3.2 g/dL — ABNORMAL LOW (ref 3.5–5.0)
Alk. phosphatase: 54 U/L (ref 45–117)
Anion gap: 7 mmol/L (ref 5–15)
BUN/Creatinine ratio: 10 — ABNORMAL LOW (ref 12–20)
BUN: 10 MG/DL (ref 6–20)
Bilirubin, total: 1.3 MG/DL — ABNORMAL HIGH (ref 0.2–1.0)
CO2: 24 mmol/L (ref 21–32)
Calcium: 8.6 MG/DL (ref 8.5–10.1)
Chloride: 111 mmol/L — ABNORMAL HIGH (ref 97–108)
Creatinine: 1 MG/DL (ref 0.70–1.30)
GFR est AA: >60 mL/min/{1.73_m2} (ref >60)
GFR est non-AA: >60 mL/min/{1.73_m2} (ref >60)
Globulin: 3 g/dL (ref 2.0–4.0)
Glucose: 83 mg/dL (ref 65–100)
Potassium: 4 mmol/L (ref 3.5–5.1)
Protein, total: 6.2 g/dL — ABNORMAL LOW (ref 6.4–8.2)
Sodium: 142 mmol/L (ref 136–145)

## 2017-12-20 LAB — PHOSPHORUS: Phosphorus: 2.4 MG/DL — ABNORMAL LOW (ref 2.6–4.7)

## 2017-12-20 LAB — LIPID PANEL
CHOL/HDL Ratio: 3.1 (ref 0–5.0)
Cholesterol, total: 145 MG/DL (ref <200)
HDL Cholesterol: 47 MG/DL
LDL, calculated: 87.2 MG/DL (ref 0–100)
Triglyceride: 54 MG/DL (ref <150)
VLDL, calculated: 10.8 MG/DL

## 2017-12-20 LAB — EKG, 12 LEAD, INITIAL
Atrial Rate: 91 {beats}/min
Calculated R Axis: 99 degrees
Calculated T Axis: -80 degrees
P-R Interval: 80 ms
Q-T Interval: 378 ms
QRS Duration: 112 ms
QTC Calculation (Bezet): 464 ms
Ventricular Rate: 91 {beats}/min

## 2017-12-20 LAB — HEMOGLOBIN A1C WITH EAG
Est. average glucose: 128 mg/dL
Hemoglobin A1c: 6.1 % (ref 4.2–6.3)

## 2017-12-20 LAB — MAGNESIUM: Magnesium: 2 mg/dL (ref 1.6–2.4)

## 2017-12-20 LAB — CK W/ CKMB & INDEX
CK - MB: 1.1 NG/ML (ref <3.6)
CK - MB: 1.4 NG/ML (ref <3.6)
CK-MB Index: 2.1 (ref 0–2.5)
CK-MB Index: 2.3 (ref 0–2.5)
CK: 53 U/L (ref 39–308)
CK: 62 U/L (ref 39–308)

## 2017-12-20 LAB — TROPONIN I
Troponin-I, Qt.: <0.05 ng/mL (ref <0.05)
Troponin-I, Qt.: <0.05 ng/mL (ref <0.05)

## 2017-12-20 LAB — C REACTIVE PROTEIN, QT: C-Reactive protein: <0.29 mg/dL (ref 0.00–0.60)

## 2017-12-20 LAB — TSH 3RD GENERATION: TSH: 8.79 u[IU]/mL — ABNORMAL HIGH (ref 0.36–3.74)

## 2017-12-20 LAB — SED RATE (ESR): Sed rate, automated: 3 mm/hr (ref 0–20)

## 2017-12-20 MED ORDER — HYDRALAZINE 20 MG/ML IJ SOLN
20 mg/mL | Freq: Four times a day (QID) | INTRAMUSCULAR | Status: DC | PRN
Start: 2017-12-20 — End: 2017-12-24
  Administered 2017-12-21: 23:00:00 via INTRAVENOUS

## 2017-12-20 MED ORDER — ASPIRIN 81 MG CHEWABLE TAB
81 mg | ORAL | Status: AC
Start: 2017-12-20 — End: 2017-12-19
  Administered 2017-12-20: 04:00:00 via ORAL

## 2017-12-20 MED ORDER — SODIUM CHLORIDE 0.9% BOLUS IV
0.9 % | Freq: Once | INTRAVENOUS | Status: AC
Start: 2017-12-20 — End: 2017-12-20
  Administered 2017-12-20: 07:00:00 via INTRAVENOUS

## 2017-12-20 MED ORDER — CLOPIDOGREL 75 MG TAB
75 mg | ORAL | Status: AC
Start: 2017-12-20 — End: 2017-12-20
  Administered 2017-12-20: 05:00:00 via ORAL

## 2017-12-20 MED ORDER — SODIUM CHLORIDE 0.9 % IV
INTRAVENOUS | Status: DC
Start: 2017-12-20 — End: 2017-12-20
  Administered 2017-12-20 (×4): via INTRAVENOUS

## 2017-12-20 MED ORDER — ACETAMINOPHEN (TYLENOL) SOLUTION 32MG/ML
ORAL | Status: DC | PRN
Start: 2017-12-20 — End: 2017-12-24

## 2017-12-20 MED ORDER — PHENYLEPHRINE 30 MG/250 ML INFUSION
30 mg/ 250 ml | INTRAVENOUS | Status: DC
Start: 2017-12-20 — End: 2017-12-20

## 2017-12-20 MED ORDER — ACETAMINOPHEN 325 MG TABLET
325 mg | ORAL | Status: DC | PRN
Start: 2017-12-20 — End: 2017-12-24

## 2017-12-20 MED ORDER — PHENYLEPHRINE 30 MG/250 ML INFUSION
30 mg/ 250 ml | INTRAVENOUS | Status: DC
Start: 2017-12-20 — End: 2017-12-20
  Administered 2017-12-20 (×6): via INTRAVENOUS

## 2017-12-20 MED ORDER — ASPIRIN 81 MG CHEWABLE TAB
81 mg | Freq: Every day | ORAL | Status: DC
Start: 2017-12-20 — End: 2017-12-21
  Administered 2017-12-20 – 2017-12-21 (×2): via ORAL

## 2017-12-20 MED ORDER — ONDANSETRON (PF) 4 MG/2 ML INJECTION
4 mg/2 mL | Freq: Four times a day (QID) | INTRAMUSCULAR | Status: DC | PRN
Start: 2017-12-20 — End: 2017-12-24

## 2017-12-20 MED ORDER — ATORVASTATIN 40 MG TAB
40 mg | Freq: Every evening | ORAL | Status: DC
Start: 2017-12-20 — End: 2017-12-24
  Administered 2017-12-20 – 2017-12-24 (×5): via ORAL

## 2017-12-20 MED ORDER — ENOXAPARIN 30 MG/0.3 ML SUB-Q SYRINGE
30 mg/0.3 mL | SUBCUTANEOUS | Status: DC
Start: 2017-12-20 — End: 2017-12-20

## 2017-12-20 MED ORDER — ACETAMINOPHEN 325 MG TABLET
325 mg | ORAL | Status: DC | PRN
Start: 2017-12-20 — End: 2017-12-24
  Administered 2017-12-23 – 2017-12-24 (×2): via ORAL

## 2017-12-20 MED ORDER — BISACODYL 10 MG RECTAL SUPPOSITORY
10 mg | Freq: Every day | RECTAL | Status: DC | PRN
Start: 2017-12-20 — End: 2017-12-24

## 2017-12-20 MED ORDER — SODIUM CHLORIDE 0.9 % IJ SYRG
INTRAMUSCULAR | Status: DC | PRN
Start: 2017-12-20 — End: 2017-12-24

## 2017-12-20 MED ORDER — CLOPIDOGREL 300 MG TAB
300 mg | ORAL | Status: DC
Start: 2017-12-20 — End: 2017-12-19

## 2017-12-20 MED ORDER — SODIUM CHLORIDE 0.9 % IJ SYRG
Freq: Three times a day (TID) | INTRAMUSCULAR | Status: DC
Start: 2017-12-20 — End: 2017-12-24
  Administered 2017-12-20 – 2017-12-24 (×14): via INTRAVENOUS

## 2017-12-20 MED ORDER — ACETAMINOPHEN 650 MG RECTAL SUPPOSITORY
650 mg | RECTAL | Status: DC | PRN
Start: 2017-12-20 — End: 2017-12-24

## 2017-12-20 MED FILL — SODIUM CHLORIDE 0.9 % IV: INTRAVENOUS | Qty: 1000

## 2017-12-20 MED FILL — PHENYLEPHRINE 30 MG/250 ML INFUSION: 30 mg/ 250 ml | INTRAVENOUS | Qty: 250

## 2017-12-20 MED FILL — CLOPIDOGREL 75 MG TAB: 75 mg | ORAL | Qty: 4

## 2017-12-20 MED FILL — NORMAL SALINE FLUSH 0.9 % INJECTION SYRINGE: INTRAMUSCULAR | Qty: 10

## 2017-12-20 MED FILL — NORMAL SALINE FLUSH 0.9 % INJECTION SYRINGE: INTRAMUSCULAR | Qty: 40

## 2017-12-20 MED FILL — ATORVASTATIN 40 MG TAB: 40 mg | ORAL | Qty: 1

## 2017-12-20 MED FILL — CHILDREN'S ASPIRIN 81 MG CHEWABLE TABLET: 81 mg | ORAL | Qty: 1

## 2017-12-20 NOTE — Consults (Signed)
Consults  by Gray Bernhardt, NP at 12/20/17 1016                Author: Gray Bernhardt, NP  Service: Neurology  Author Type: Nurse Practitioner       Filed: 12/20/17 1137  Date of Service: 12/20/17 1016  Status: Attested Addendum          Editor: Sybil Shrader, Ellis Parents, NP (Nurse Practitioner)       Related Notes: Original Note by Gray Bernhardt, NP (Nurse Practitioner) filed at 12/20/17  1136          Cosigner: Orson Ape, DO at 12/20/17 1241            Consult Orders        1. IP CONSULT TO NEUROLOGY [[id number]] ordered by Astrid Drafts, MD at 12/19/17 2100                         Attestation signed by Orson Ape, DO at 12/20/17 1241 (Updated)          I have reviewed the documentation provided by the nurse practitioner, discussed her findings, clinical impression, and the proposed management plans with regards  to this encounter.  I have personally evaluated the patient and verified the history and confirmed the physical findings.  Below are my additional findings:      84 year old RHM with a h/o gait instability x 6 months, more recent recurrent episodes of slurred speech/aphasia lasting several minutes each over the past several months without associated focal weakness or numbness presenting with acute severe dysarthria  noted by his neighbors 12/19/17.  There was also mention of a possible right facial droop.  Symptoms recurred upon ED evaluation but are presently resolved.  CT head did not reveal any acute intracranial process.  CTA/CTP reviewed occlusion of the distal  L M2/3 segment not amenable to endovascular intervention, decreased perfusion of the L frontal region.  He is presently at his baseline with non focal examination apart from pre-existing gait instability.     A/P L MCA TIA vs small territory infarct, L M2/3 occlusion not amenable to endovascular intervention.     -MRI Brain pending   -Cont. ASA 84m/daily for stroke prevention   -Statin therapy, goal LDL<70    -Telemetry, TTE pending   -SBP 120-160 today, D/C Neo   -Stroke education   -PT/OT/ST evaluations   -May transfer to [place]   12/20/17                                                    Neurology Consult   TFrutoso Chase AGACNP-BC   Neurocritical Care NP   [phone]         Patient: [name] MRN: [id number]  SSN: [id number]         Date of Birth: [id number]  Age: 84y.o.   Sex: male            Chief Complaint: speech difficulty        Subjective:         [name] a 84y.o. male who is being seen for complaints of slurred speech. [facility] presented  to  [facility] yesterday after his neighbors witnessed him mumbling his words. Information was obtained from the patient. He reports that about a year ago he noticed that his balance was off. It has been ongoing intermittently, however, he did not seek  any medical attention since the start of his symptoms. Then he reports about 3 months ago he started mumbling his words. He reports, "Sometimes I can't get the words out." Symptoms occur intermittently. He denies any alleviating or aggravating factors.  Symptoms are accompanied with dizziness when walking/standing. It was also reported that he developed right-sided facial droop. Since Friday, he felt his speech difficulty worsened. His neighbors witnessed his speech difficulty yesterday and brought him  to a [facility] for evaluation. He was then transferred to [facility] ED. Per record, his symptoms of slurred speech and right sided facial droop had resolved en route to [facility] ED. However, upon arrival to the ED, he had another episode of slurred  speech and teleneurology was re-consulted and no intervention was advised. On arrival to the ED, a CT of the head was performed which showed mild white matter disease likely to chronic small vessel ischemic disease, no evidence of acute process. A CTA  of the head and neck and a CT perfusion study was performed which showed no  evidence of large vessel occlusion. There is a small branch of the posterior MCA in the sylvian fissure that gradually tapers to occlusion. Moderate-sized area of increased mean  transit time and decreased blood flow in the posterior left frontal lobe concerning for acute infarction. Mild narrowing of the bilateral proximal internal carotid arteries related to atherosclerotic calcification. Imaging was reviewed by neuro-IR and  unable to intervene due to distal nature of the obstruction. The patient was given a dose of ASA and plavix and admitted to the ICU for closer monitoring.      He denies any history of stroke or TIA. He denies taking any antiplatelets or anticoagulants at home. He has no history of recent falls, head or neck trauma.       He denies any fever, generalized weakness, headache, visual disturbances, diplopia, neck pain, palpitations, abdominal pain, difficulty swallowing, nausea vomiting, bloody stools, diarrhea, constipation, incontinence, dysuria, urinary frequency, myalgias/muscle  stiffness, back pain, itching, numbness, tingling, limb weakness, anemia, or easy bruising. He reported having a few episodes of mild chest pain described as dull/ache associated with SOB and he saw a medical provider who diagnosed him with bronchitis  per patient.         Past Medical History:        Diagnosis  Date         ?  Arthritis       ?  Chronic pain            RIGHT HIP        History reviewed. No pertinent surgical history.      Family History         Problem  Relation  Age of Onset          ?  Cancer  Mother                BREAST CA          ?  Heart Disease  Father            Social History          Tobacco Use         ?  Smoking status:  Never Smoker     ?  Smokeless tobacco:  Never Used       Substance Use Topics         ?  Alcohol use:  Yes             Comment: 2/WEEK VODKA           Current Facility-Administered Medications             Medication  Dose  Route  Frequency  Provider  Last Rate  Last  Dose              ?  PHENYLephrine (NEO-SYNEPHRINE) 30 mg in 0.9% sodium chloride 250 mL infusion   10-300 mcg/min  IntraVENous  TITRATE  Zannie Kehr H, DO  17.5 mL/hr at 12/20/17 0808  35 mcg/min at 12/20/17 0808              ?  acetaminophen (TYLENOL) tablet 650 mg   650 mg  Oral  Q4H PRN  Eniola, Razaak A, MD                    ?  0.9% sodium chloride infusion   150 mL/hr  IntraVENous  CONTINUOUS  Eniola, Razaak A, MD  150 mL/hr at 12/20/17 0806  150 mL/hr at 12/20/17 0806     ?  sodium chloride (NS) flush 5-10 mL   5-10 mL  IntraVENous  Q8H  Eniola, Razaak A, MD     10 mL at 12/20/17 0537     ?  sodium chloride (NS) flush 5-10 mL   5-10 mL  IntraVENous  PRN  Eniola, Razaak A, MD           ?  acetaminophen (TYLENOL) tablet 650 mg   650 mg  Oral  Q4H PRN  Eniola, Razaak A, MD                Or              ?  acetaminophen (TYLENOL) solution 650 mg   650 mg  Per NG tube  Q4H PRN  Eniola, Razaak A, MD                Or              ?  acetaminophen (TYLENOL) suppository 650 mg   650 mg  Rectal  Q4H PRN  Eniola, Razaak A, MD           ?  ondansetron (ZOFRAN) injection 4 mg   4 mg  IntraVENous  Q6H PRN  Eniola, Razaak A, MD           ?  atorvastatin (LIPITOR) tablet 40 mg   40 mg  Oral  QHS  Eniola, Razaak A, MD     40 mg at 12/19/17 2252              ?  bisacodyl (DULCOLAX) suppository 10 mg   10 mg  Rectal  DAILY PRN  Eniola, Razaak A, MD                  No Known Allergies      Review of Systems:   A comprehensive review of systems was negative except for that written in the History of Present Illness.           Objective:          Vitals:  12/20/17 0837  12/20/17 0900  12/20/17 0930  12/20/17 1000           BP:  (!) 150/94  148/61  138/61  122/46     Pulse:  74  65  73  67     Resp:  '16  15  17  16     ' Temp:             SpO2:  (!) 88%  96%  94%  92%     Weight:                   Height:                    Physical Exam:   GENERAL: alert, cooperative, no distress, appears stated age   EYE:  conjunctivae/corneas clear. PERRL, EOM's intact.    THROAT & NECK: normal and neck supple/symmetrical   LUNG: clear to auscultation bilaterally   HEART: regular rate and rhythm, S1, S2 normal, no murmur, click, rub or gallop, no carotid bruits detected   ABDOMEN: soft, non-tender. Bowel sounds normal. No masses,  no organomegaly   EXTREMITIES:  extremities normal, atraumatic, no cyanosis or edema,    SKIN: small area of ecchymosis on left arm/hand. Skin warm, clean, dry, and intact.       Neurologic Exam:   Mental Status:  Alert and oriented x 4.  Appropriate affect, mood and behavior.         Language:    Normal fluency and naming.       Cranial Nerves:         Pupils equal, round and reactive to light. 3 mm bilaterally      Visual fields full to confrontation.      Extraocular movements intact.         Facial sensation intact.      Full facial strength, no asymmetry.       Hearing grossly intact bilaterally.      No dysarthria. Tongue protrudes to midline, palate elevates symmetrically.       Shoulder shrug 5/5 bilaterally.      Motor:    No pronator drift.       Bulk and tone normal.       5/5 power in all extremities proximally and distally.      No involuntary movements.      Sensation:    Sensation intact throughout to light touch and pinprick.      Reflexes:    Negative Babinskis      Coordination & Gait: HTN and FTS intact. Unsteady gait.          Recent Results (from the past 24 hour(s))     CBC WITH AUTOMATED DIFF          Collection Time: 12/19/17  6:04 PM         Result  Value  Ref Range            WBC  5.8  4.1 - 11.1 K/uL       RBC  4.38  4.10 - 5.70 M/uL       HGB  14.5  12.1 - 17.0 g/dL       HCT  44.5  36.6 - 50.3 %       MCV  101.6 (H)  80.0 - 99.0 FL       MCH  33.1  26.0 - 34.0 PG  MCHC  32.6  30.0 - 36.5 g/dL       RDW  13.5  11.5 - 14.5 %       PLATELET  91 (L)  150 - 400 K/uL       MPV  11.3  8.9 - 12.9 FL       NRBC  0.0  0 PER 100 WBC       ABSOLUTE NRBC  0.00  0.00 - 0.01 K/uL        NEUTROPHILS  65  32 - 75 %       LYMPHOCYTES  22  12 - 49 %       MONOCYTES  9  5 - 13 %       EOSINOPHILS  3  0 - 7 %       BASOPHILS  1  0 - 1 %       IMMATURE GRANULOCYTES  0  0.0 - 0.5 %       ABS. NEUTROPHILS  3.8  1.8 - 8.0 K/UL       ABS. LYMPHOCYTES  1.3  0.8 - 3.5 K/UL       ABS. MONOCYTES  0.5  0.0 - 1.0 K/UL       ABS. EOSINOPHILS  0.2  0.0 - 0.4 K/UL       ABS. BASOPHILS  0.0  0.0 - 0.1 K/UL       ABS. IMM. GRANS.  0.0  0.00 - 0.04 K/UL       DF  AUTOMATED          METABOLIC PANEL, COMPREHENSIVE          Collection Time: 12/19/17  6:04 PM         Result  Value  Ref Range            Sodium  137  136 - 145 mmol/L       Potassium  3.9  3.5 - 5.1 mmol/L       Chloride  103  97 - 108 mmol/L       CO2  26  21 - 32 mmol/L       Anion gap  8  5 - 15 mmol/L       Glucose  107 (H)  65 - 100 mg/dL       BUN  12  6 - 20 MG/DL       Creatinine  1.04  0.70 - 1.30 MG/DL       BUN/Creatinine ratio  12  12 - 20         GFR est AA  >60  >60 ml/min/1.19m       GFR est non-AA  >60  >60 ml/min/1.763m      Calcium  8.8  8.5 - 10.1 MG/DL       Bilirubin, total  1.0  0.2 - 1.0 MG/DL       ALT (SGPT)  42  12 - 78 U/L       AST (SGOT)  38 (H)  15 - 37 U/L       Alk. phosphatase  61  45 - 117 U/L       Protein, total  6.9  6.4 - 8.2 g/dL       Albumin  3.5  3.5 - 5.0 g/dL       Globulin  3.4  2.0 - 4.0 g/dL       A-G Ratio  1.0 (L)  1.1 - 2.2         SAMPLES BEING HELD          Collection Time: 12/19/17  6:04 PM         Result  Value  Ref Range            SAMPLES BEING HELD  red         COMMENT                  Add-on orders for these samples will be processed based on acceptable specimen integrity and analyte stability, which may vary by analyte.       PTT          Collection Time: 12/19/17  6:04 PM         Result  Value  Ref Range            aPTT  29.4  22.1 - 32.0 sec       aPTT, therapeutic range       58.0 - 77.0 SECS       EKG, 12 LEAD, INITIAL          Collection Time: 12/19/17  6:16 PM         Result  Value  Ref Range             Ventricular Rate  91  BPM       Atrial Rate  91  BPM       P-R Interval  80  ms       QRS Duration  112  ms       Q-T Interval  378  ms       QTC Calculation (Bezet)  464  ms       Calculated R Axis  99  degrees       Calculated T Axis  -80  degrees       Diagnosis                 Sinus rhythm with short PR with premature supraventricular complexes in a    pattern of bigeminy and fusion complexes with junctional escape complexes   Lateral infarct , age undetermined   Marked ST abnormality, possible inferior subendocardial injury   Marked ST abnormality, possible anterior subendocardial injury   When compared with ECG of 27-May-2012 13:04,   fusion complexes are now present   premature supraventricular complexes are now present   Sinus rhythm is now with junctional escape complexes   Questionable change in QRS duration   Lateral infarct is now present          TROPONIN I          Collection Time: 12/19/17  9:39 PM         Result  Value  Ref Range            Troponin-I, Qt.  <0.05  <0.05 ng/mL       CK W/ CKMB & INDEX          Collection Time: 12/19/17  9:39 PM         Result  Value  Ref Range            CK  53  39 - 308 U/L       CK - MB  1.1  <3.6 NG/ML       CK-MB Index  2.1  0 - 2.5         SED RATE (ESR)  Collection Time: 12/19/17  9:39 PM         Result  Value  Ref Range            Sed rate, automated  3  0 - 20 mm/hr       C REACTIVE PROTEIN, QT          Collection Time: 12/19/17  9:39 PM         Result  Value  Ref Range            C-Reactive protein  <0.29  0.00 - 0.60 mg/dL       LIPID PANEL          Collection Time: 12/20/17  5:40 AM         Result  Value  Ref Range            LIPID PROFILE               Cholesterol, total  145  <200 MG/DL       Triglyceride  54  <150 MG/DL       HDL Cholesterol  47  MG/DL       LDL, calculated  87.2  0 - 100 MG/DL       VLDL, calculated  10.8  MG/DL       CHOL/HDL Ratio  3.1  0 - 5.0         HEMOGLOBIN A1C WITH EAG          Collection Time: 12/20/17  5:40  AM         Result  Value  Ref Range            Hemoglobin A1c  6.1  4.2 - 6.3 %       Est. average glucose  128  mg/dL       TROPONIN I          Collection Time: 12/20/17  5:40 AM         Result  Value  Ref Range            Troponin-I, Qt.  <0.05  <0.05 ng/mL       CK W/ CKMB & INDEX          Collection Time: 12/20/17  5:40 AM         Result  Value  Ref Range            CK  62  39 - 308 U/L       CK - MB  1.4  <3.6 NG/ML       CK-MB Index  2.3  0 - 2.5         TSH 3RD GENERATION          Collection Time: 12/20/17  5:40 AM         Result  Value  Ref Range            TSH  8.79 (H)  0.36 - 3.74 uIU/mL       PHOSPHORUS          Collection Time: 12/20/17  5:40 AM         Result  Value  Ref Range            Phosphorus  2.4 (L)  2.6 - 4.7 MG/DL       MAGNESIUM          Collection Time: 12/20/17  5:40 AM         Result  Value  Ref Range            Magnesium  2.0  1.6 - 2.4 mg/dL       METABOLIC PANEL, COMPREHENSIVE          Collection Time: 12/20/17  5:40 AM         Result  Value  Ref Range            Sodium  142  136 - 145 mmol/L       Potassium  4.0  3.5 - 5.1 mmol/L       Chloride  111 (H)  97 - 108 mmol/L       CO2  24  21 - 32 mmol/L       Anion gap  7  5 - 15 mmol/L       Glucose  83  65 - 100 mg/dL       BUN  10  6 - 20 MG/DL       Creatinine  1.00  0.70 - 1.30 MG/DL       BUN/Creatinine ratio  10 (L)  12 - 20         GFR est AA  >60  >60 ml/min/1.69m       GFR est non-AA  >60  >60 ml/min/1.724m      Calcium  8.6  8.5 - 10.1 MG/DL       Bilirubin, total  1.3 (H)  0.2 - 1.0 MG/DL       ALT (SGPT)  41  12 - 78 U/L       AST (SGOT)  39 (H)  15 - 37 U/L       Alk. phosphatase  54  45 - 117 U/L       Protein, total  6.2 (L)  6.4 - 8.2 g/dL       Albumin  3.2 (L)  3.5 - 5.0 g/dL       Globulin  3.0  2.0 - 4.0 g/dL            A-G Ratio  1.1  1.1 - 2.2          Imaging:   CT of Head on 12/20/2017 shows    IMPRESSION: Mild white matter disease likely to chronic small vessel ischemic   disease. No evidence of acute  intracranial process.      CTA of Head and Neck on 12/19/2017 preliminary report shows      1. No evidence of large vessel occlusion. There is a small branch of the   posterior MCA in the sylvian fissure on series 2 image 429 that gradually tapers   to occlusion.   2. Moderate-sized area of increased mean transit time and decreased blood flow   in the posterior left frontal lobe concerning for acute infarction. MRI could be   used for confirmation.   3. Mild narrowing of the bilateral proximal internal carotid arteries related to   atherosclerotic calcification.   ??   Preliminary report was provided by Dr. peKennon Roundsthe on-call radiologist, at 6:20 PM   on 12/19/2017.      Final report to follow.           Assessment:           Hospital Problems   Date Reviewed:  12/19/2017                         Codes  Class  Noted  POA              * (Principal) Acute CVA (cerebrovascular accident) ([place])  ICD-10-CM: I63.9   ICD-9-CM: 434.91    12/19/2017  Yes                            Plan:     1. Acute CVA    - neuro exam stable, symptoms of speech difficulty have resolved, no focal neuro deficits, balance is abnormal when ambulating. CT negative for any acute process, chronic small vessel ischemic disease seen on CT. Occlusion seen on the small branch  of the posterior MCA and mild narrowing due to atherosclerotic calcification seen on bilateral proximal ICA's on CTA, decreased blood flow in the left frontal lobe seen on CT perfusion study. Neuro-IR unable to intervene due to distal nature of the obstruction    - MRI of Brain ordered and pending    - Lipid panel ok, LDL 87.2 goal <70, continue lipitor 40 mg daily    - start 81 mg of ASA daily for stroke prevention     - Hgb A1C ok, 6.1    - 2D ECHO ordered to rule out any cardiac abnormality    - SBP goal <180, wean off Neosynephrine, patient does not appear to be pressure dependent, hydralazine PRN    - neuro checks every hour     - PT/OT eval, SLP eval if needed    - Case Management  consult    - Stroke education     - Neurology following    2. Thrombocytopenia    - Platelet count 91    - unclear etiology, no history of alcohol abuse    - Continue to monitor    - Hospitalist following       Activity: Up with assistance    DVT ppx: SCDs   Dispo: TBD       Plan discussed with Dr. Mortimer Fries, ICU nurse, and patient. Stroke education provided to patient.       Thank you for this consult and participating in the care of this patient.   I have discussed the diagnosis with the patient and the intended plan as seen in the above orders. Patient is in agreement.            Signed By:  Gray Bernhardt, NP           December 20, 2017

## 2017-12-20 NOTE — Progress Notes (Signed)
Problem: Falls - Risk of  Goal: *Absence of Falls  Document Schmid Fall Risk and appropriate interventions in the flowsheet.  Outcome: Progressing Towards Goal  Fall Risk Interventions:  Mobility Interventions: Communicate number of staff needed for ambulation/transfer, Patient to call before getting OOB         Medication Interventions: Assess postural VS orthostatic hypotension, Evaluate medications/consider consulting pharmacy, Patient to call before getting OOB, Teach patient to arise slowly    Elimination Interventions: Call light in reach, Patient to call for help with toileting needs, Toilet paper/wipes in reach, Toileting schedule/hourly rounds, Urinal in reach             Problem: Pressure Injury - Risk of  Goal: *Prevention of pressure injury  Document Braden Scale and appropriate interventions in the flowsheet.  Outcome: Progressing Towards Goal  Pressure Injury Interventions:       Moisture Interventions: Absorbent underpads, Check for incontinence Q2 hours and as needed, Maintain skin hydration (lotion/cream), Minimize layers, Moisture barrier, Offer toileting Q_hr    Activity Interventions: Increase time out of bed, Pressure redistribution bed/mattress(bed type)    Mobility Interventions: HOB 30 degrees or less, Pressure redistribution bed/mattress (bed type), Turn and reposition approx. every two hours(pillow and wedges)    Nutrition Interventions: Document food/fluid/supplement intake    Friction and Shear Interventions: HOB 30 degrees or less, Lift sheet, Lift team/patient mobility team, Minimize layers

## 2017-12-20 NOTE — Progress Notes (Signed)
1930 Report received from Bernadene PersonStevie Jackson RN using SBAR format--  0730 Report given to Leroy Seaharlie Harper RN using [employer] Homes[employer] format

## 2017-12-20 NOTE — Consults (Addendum)
Neurology Consult  Frutoso Chase, AGACNP-BC  Neurocritical Care NP  [phone]    Patient: [name] MRN: [id number]  SSN: [id number]    Date of Birth: Jun 08, 1932  Age: 84 y.o.  Sex: male        Chief Complaint: speech difficulty    Subjective:      [name] is a 84 y.o. male who is being seen for complaints of slurred speech. He presented to [facility] yesterday after his neighbors witnessed him mumbling his words. Information was obtained from the patient. He reports that about a year ago he noticed that his balance was off. It has been ongoing intermittently, however, he did not seek any medical attention since the start of his symptoms. Then he reports about 3 months ago he started mumbling his words. He reports, "Sometimes I can't get the words out." Symptoms occur intermittently. He denies any alleviating or aggravating factors. Symptoms are accompanied with dizziness when walking/standing. It was also reported that he developed right-sided facial droop. Since Friday, he felt his speech difficulty worsened. His neighbors witnessed his speech difficulty yesterday and brought him to a [facility] for evaluation. He was then transferred to [facility] ED. Per record, his symptoms of slurred speech and right sided facial droop had resolved en route to [facility] ED. However, upon arrival to the ED, he had another episode of slurred speech and teleneurology was re-consulted and no intervention was advised. On arrival to the ED, a CT of the head was performed which showed mild white matter disease likely to chronic small vessel ischemic disease, no evidence of acute process. A CTA of the head and neck and a CT perfusion study was performed which showed no evidence of large vessel occlusion. There is a small branch of the posterior MCA in the sylvian fissure that gradually tapers to occlusion. Moderate-sized area of increased mean transit time  and decreased blood flow in the posterior left frontal lobe concerning for acute infarction. Mild narrowing of the bilateral proximal internal carotid arteries related to atherosclerotic calcification. Imaging was reviewed by neuro-IR and unable to intervene due to distal nature of the obstruction. The patient was given a dose of ASA and plavix and admitted to the ICU for closer monitoring.    He denies any history of stroke or TIA. He denies taking any antiplatelets or anticoagulants at home. He has no history of recent falls, head or neck trauma.     He denies any fever, generalized weakness, headache, visual disturbances, diplopia, neck pain, palpitations, abdominal pain, difficulty swallowing, nausea vomiting, bloody stools, diarrhea, constipation, incontinence, dysuria, urinary frequency, myalgias/muscle stiffness, back pain, itching, numbness, tingling, limb weakness, anemia, or easy bruising. He reported having a few episodes of mild chest pain described as dull/ache associated with SOB and he saw a medical provider who diagnosed him with bronchitis per patient.     Past Medical History:   Diagnosis Date   ??? Arthritis    ??? Chronic pain     RIGHT HIP     History reviewed. No pertinent surgical history.   Family History   Problem Relation Age of Onset   ??? Cancer Mother         BREAST CA   ??? Heart Disease Father      Social History     Tobacco Use   ??? Smoking status: Never Smoker   ??? Smokeless tobacco: Never Used   Substance Use Topics   ??? Alcohol use:  Yes     Comment: 2/WEEK VODKA      Current Facility-Administered Medications   Medication Dose Route Frequency Provider Last Rate Last Dose   ??? PHENYLephrine (NEO-SYNEPHRINE) 30 mg in 0.9% sodium chloride 250 mL infusion  10-300 mcg/min IntraVENous TITRATE Zannie Kehr H, DO 17.5 mL/hr at 12/20/17 0808 35 mcg/min at 12/20/17 9381   ??? acetaminophen (TYLENOL) tablet 650 mg  650 mg Oral Q4H PRN Eniola, Razaak A, MD        ??? 0.9% sodium chloride infusion  150 mL/hr IntraVENous CONTINUOUS Eniola, Razaak A, MD 150 mL/hr at 12/20/17 0806 150 mL/hr at 12/20/17 0806   ??? sodium chloride (NS) flush 5-10 mL  5-10 mL IntraVENous Q8H Eniola, Razaak A, MD   10 mL at 12/20/17 0537   ??? sodium chloride (NS) flush 5-10 mL  5-10 mL IntraVENous PRN Eniola, Razaak A, MD       ??? acetaminophen (TYLENOL) tablet 650 mg  650 mg Oral Q4H PRN Eniola, Razaak A, MD        Or   ??? acetaminophen (TYLENOL) solution 650 mg  650 mg Per NG tube Q4H PRN Eniola, Razaak A, MD        Or   ??? acetaminophen (TYLENOL) suppository 650 mg  650 mg Rectal Q4H PRN Eniola, Razaak A, MD       ??? ondansetron (ZOFRAN) injection 4 mg  4 mg IntraVENous Q6H PRN Eniola, Razaak A, MD       ??? atorvastatin (LIPITOR) tablet 40 mg  40 mg Oral QHS Eniola, Razaak A, MD   40 mg at 12/19/17 2252   ??? bisacodyl (DULCOLAX) suppository 10 mg  10 mg Rectal DAILY PRN Eniola, Razaak A, MD            No Known Allergies    Review of Systems:  A comprehensive review of systems was negative except for that written in the History of Present Illness.      Objective:     Vitals:    12/20/17 0837 12/20/17 0900 12/20/17 0930 12/20/17 1000   BP: (!) 150/94 148/61 138/61 122/46   Pulse: 74 65 73 67   Resp: _0 Temp:       SpO2: (!) 88% 96% 94% 92%   Weight:       Height:            Physical Exam:  GENERAL: alert, cooperative, no distress, appears stated age  EYE: conjunctivae/corneas clear. PERRL, EOM's intact.   THROAT & NECK: normal and neck supple/symmetrical  LUNG: clear to auscultation bilaterally  HEART: regular rate and rhythm, S1, S2 normal, no murmur, click, rub or gallop, no carotid bruits detected  ABDOMEN: soft, non-tender. Bowel sounds normal. No masses,  no organomegaly  EXTREMITIES:  extremities normal, atraumatic, no cyanosis or edema,   SKIN: small area of ecchymosis on left arm/hand. Skin warm, clean, dry, and intact.     Neurologic Exam:   Mental Status:  Alert and oriented x 4.  Appropriate affect, mood and behavior.       Language:    Normal fluency and naming.     Cranial Nerves:        Pupils equal, round and reactive to light. 3 mm bilaterally     Visual fields full to confrontation.     Extraocular movements intact.        Facial sensation intact.     Full facial strength, no asymmetry.  Hearing grossly intact bilaterally.     No dysarthria. Tongue protrudes to midline, palate elevates symmetrically.      Shoulder shrug 5/5 bilaterally.    Motor:    No pronator drift.      Bulk and tone normal.      5/5 power in all extremities proximally and distally.     No involuntary movements.    Sensation:    Sensation intact throughout to light touch and pinprick.    Reflexes:    Negative Babinskis    Coordination & Gait: HTN and FTS intact. Unsteady gait.      Recent Results (from the past 24 hour(s))   CBC WITH AUTOMATED DIFF    Collection Time: 12/19/17  6:04 PM   Result Value Ref Range    WBC 5.8 4.1 - 11.1 K/uL    RBC 4.38 4.10 - 5.70 M/uL    HGB 14.5 12.1 - 17.0 g/dL    HCT 44.5 36.6 - 50.3 %    MCV 101.6 (H) 80.0 - 99.0 FL    MCH 33.1 26.0 - 34.0 PG    MCHC 32.6 30.0 - 36.5 g/dL    RDW 13.5 11.5 - 14.5 %    PLATELET 91 (L) 150 - 400 K/uL    MPV 11.3 8.9 - 12.9 FL    NRBC 0.0 0 PER 100 WBC    ABSOLUTE NRBC 0.00 0.00 - 0.01 K/uL    NEUTROPHILS 65 32 - 75 %    LYMPHOCYTES 22 12 - 49 %    MONOCYTES 9 5 - 13 %    EOSINOPHILS 3 0 - 7 %    BASOPHILS 1 0 - 1 %    IMMATURE GRANULOCYTES 0 0.0 - 0.5 %    ABS. NEUTROPHILS 3.8 1.8 - 8.0 K/UL    ABS. LYMPHOCYTES 1.3 0.8 - 3.5 K/UL    ABS. MONOCYTES 0.5 0.0 - 1.0 K/UL    ABS. EOSINOPHILS 0.2 0.0 - 0.4 K/UL    ABS. BASOPHILS 0.0 0.0 - 0.1 K/UL    ABS. IMM. GRANS. 0.0 0.00 - 0.04 K/UL    DF AUTOMATED     METABOLIC PANEL, COMPREHENSIVE    Collection Time: 12/19/17  6:04 PM   Result Value Ref Range    Sodium 137 136 - 145 mmol/L    Potassium 3.9 3.5 - 5.1 mmol/L    Chloride 103 97 - 108 mmol/L     CO2 26 21 - 32 mmol/L    Anion gap 8 5 - 15 mmol/L    Glucose 107 (H) 65 - 100 mg/dL    BUN 12 6 - 20 MG/DL    Creatinine 1.04 0.70 - 1.30 MG/DL    BUN/Creatinine ratio 12 12 - 20      GFR est AA >60 >60 ml/min/1.64m    GFR est non-AA >60 >60 ml/min/1.748m   Calcium 8.8 8.5 - 10.1 MG/DL    Bilirubin, total 1.0 0.2 - 1.0 MG/DL    ALT (SGPT) 42 12 - 78 U/L    AST (SGOT) 38 (H) 15 - 37 U/L    Alk. phosphatase 61 45 - 117 U/L    Protein, total 6.9 6.4 - 8.2 g/dL    Albumin 3.5 3.5 - 5.0 g/dL    Globulin 3.4 2.0 - 4.0 g/dL    A-G Ratio 1.0 (L) 1.1 - 2.2     SAMPLES BEING HELD    Collection Time: 12/19/17  6:04 PM   Result Value  Ref Range    SAMPLES BEING HELD red     COMMENT        Add-on orders for these samples will be processed based on acceptable specimen integrity and analyte stability, which may vary by analyte.   PTT    Collection Time: 12/19/17  6:04 PM   Result Value Ref Range    aPTT 29.4 22.1 - 32.0 sec    aPTT, therapeutic range     58.0 - 77.0 SECS   EKG, 12 LEAD, INITIAL    Collection Time: 12/19/17  6:16 PM   Result Value Ref Range    Ventricular Rate 91 BPM    Atrial Rate 91 BPM    P-R Interval 80 ms    QRS Duration 112 ms    Q-T Interval 378 ms    QTC Calculation (Bezet) 464 ms    Calculated R Axis 99 degrees    Calculated T Axis -80 degrees    Diagnosis       Sinus rhythm with short PR with premature supraventricular complexes in a   pattern of bigeminy and fusion complexes with junctional escape complexes  Lateral infarct , age undetermined  Marked ST abnormality, possible inferior subendocardial injury  Marked ST abnormality, possible anterior subendocardial injury  When compared with ECG of 27-May-2012 13:04,  fusion complexes are now present  premature supraventricular complexes are now present  Sinus rhythm is now with junctional escape complexes  Questionable change in QRS duration  Lateral infarct is now present     TROPONIN I    Collection Time: 12/19/17  9:39 PM   Result Value Ref Range     Troponin-I, Qt. <0.05 <0.05 ng/mL   CK W/ CKMB & INDEX    Collection Time: 12/19/17  9:39 PM   Result Value Ref Range    CK 53 39 - 308 U/L    CK - MB 1.1 <3.6 NG/ML    CK-MB Index 2.1 0 - 2.5     SED RATE (ESR)    Collection Time: 12/19/17  9:39 PM   Result Value Ref Range    Sed rate, automated 3 0 - 20 mm/hr   C REACTIVE PROTEIN, QT    Collection Time: 12/19/17  9:39 PM   Result Value Ref Range    C-Reactive protein <0.29 0.00 - 0.60 mg/dL   LIPID PANEL    Collection Time: 12/20/17  5:40 AM   Result Value Ref Range    LIPID PROFILE          Cholesterol, total 145 <200 MG/DL    Triglyceride 54 <150 MG/DL    HDL Cholesterol 47 MG/DL    LDL, calculated 87.2 0 - 100 MG/DL    VLDL, calculated 10.8 MG/DL    CHOL/HDL Ratio 3.1 0 - 5.0     HEMOGLOBIN A1C WITH EAG    Collection Time: 12/20/17  5:40 AM   Result Value Ref Range    Hemoglobin A1c 6.1 4.2 - 6.3 %    Est. average glucose 128 mg/dL   TROPONIN I    Collection Time: 12/20/17  5:40 AM   Result Value Ref Range    Troponin-I, Qt. <0.05 <0.05 ng/mL   CK W/ CKMB & INDEX    Collection Time: 12/20/17  5:40 AM   Result Value Ref Range    CK 62 39 - 308 U/L    CK - MB 1.4 <3.6 NG/ML    CK-MB Index 2.3 0 - 2.5  TSH 3RD GENERATION    Collection Time: 12/20/17  5:40 AM   Result Value Ref Range    TSH 8.79 (H) 0.36 - 3.74 uIU/mL   PHOSPHORUS    Collection Time: 12/20/17  5:40 AM   Result Value Ref Range    Phosphorus 2.4 (L) 2.6 - 4.7 MG/DL   MAGNESIUM    Collection Time: 12/20/17  5:40 AM   Result Value Ref Range    Magnesium 2.0 1.6 - 2.4 mg/dL   METABOLIC PANEL, COMPREHENSIVE    Collection Time: 12/20/17  5:40 AM   Result Value Ref Range    Sodium 142 136 - 145 mmol/L    Potassium 4.0 3.5 - 5.1 mmol/L    Chloride 111 (H) 97 - 108 mmol/L    CO2 24 21 - 32 mmol/L    Anion gap 7 5 - 15 mmol/L    Glucose 83 65 - 100 mg/dL    BUN 10 6 - 20 MG/DL    Creatinine 1.00 0.70 - 1.30 MG/DL    BUN/Creatinine ratio 10 (L) 12 - 20      GFR est AA >60 >60 ml/min/1.50m     GFR est non-AA >60 >60 ml/min/1.772m   Calcium 8.6 8.5 - 10.1 MG/DL    Bilirubin, total 1.3 (H) 0.2 - 1.0 MG/DL    ALT (SGPT) 41 12 - 78 U/L    AST (SGOT) 39 (H) 15 - 37 U/L    Alk. phosphatase 54 45 - 117 U/L    Protein, total 6.2 (L) 6.4 - 8.2 g/dL    Albumin 3.2 (L) 3.5 - 5.0 g/dL    Globulin 3.0 2.0 - 4.0 g/dL    A-G Ratio 1.1 1.1 - 2.2       Imaging:  CT of Head on 12/20/2017 shows   IMPRESSION: Mild white matter disease likely to chronic small vessel ischemic  disease. No evidence of acute intracranial process.    CTA of Head and Neck on 1/01-18-2019reliminary report shows    1. No evidence of large vessel occlusion. There is a small branch of the  posterior MCA in the sylvian fissure on series 2 image 429 that gradually tapers  to occlusion.  2. Moderate-sized area of increased mean transit time and decreased blood flow  in the posterior left frontal lobe concerning for acute infarction. MRI could be  used for confirmation.  3. Mild narrowing of the bilateral proximal internal carotid arteries related to  atherosclerotic calcification.  ??  Preliminary report was provided by Dr. peKennon Roundsthe on-call radiologist, at 6:20 PM  on 1/01-18-2019   Final report to follow.      Assessment:     Hospital Problems  Date Reviewed: 12/15/16/19        Codes Class Noted POA    * (Principal) Acute CVA (cerebrovascular accident) (HCRock RiverICD-10-CM: I63.9  ICD-9-CM: 434.91  1/Jan 18, 2019es              Plan:   1. Acute CVA   - neuro exam stable, symptoms of speech difficulty have resolved, no focal neuro deficits, balance is abnormal when ambulating. CT negative for any acute process, chronic small vessel ischemic disease seen on CT. Occlusion seen on the small branch of the posterior MCA and mild narrowing due to atherosclerotic calcification seen on bilateral proximal ICA's on CTA, decreased blood flow in the left frontal lobe seen on CT perfusion study. Neuro-IR unable to intervene due to distal nature  of the obstruction    - MRI of Brain ordered and pending   - Lipid panel ok, LDL 87.2 goal <70, continue lipitor 40 mg daily   - start 81 mg of ASA daily for stroke prevention    - Hgb A1C ok, 6.1   - 2D ECHO ordered to rule out any cardiac abnormality   - SBP goal <180, wean off Neosynephrine, patient does not appear to be pressure dependent, hydralazine PRN   - neuro checks every hour    - PT/OT eval, SLP eval if needed   - Case Management consult   - Stroke education    - Neurology following   2. Thrombocytopenia   - Platelet count 91   - unclear etiology, no history of alcohol abuse   - Continue to monitor   - Hospitalist following     Activity: Up with assistance   DVT ppx: SCDs  Dispo: TBD     Plan discussed with Dr. Mortimer Fries, ICU nurse, and patient. Stroke education provided to patient.     Thank you for this consult and participating in the care of this patient.  I have discussed the diagnosis with the patient and the intended plan as seen in the above orders. Patient is in agreement.      Signed By: Gray Bernhardt, NP     December 20, 2017

## 2017-12-20 NOTE — Progress Notes (Signed)
Hospitalist Progress Note  NAME: [name]   DOB:  [birth year]   MRN:  [id number]       Assessment / Plan:    .    Slurred speech/aphasia lasting several minutes each over the past several months without associated focal weakness or numbness presenting with acute severe dysarthria and right facial droop. Seen in consultation with neurology. Plan MRI brain and 2 D Echo. Cont .aspirin and lipitor. Consult PT/OT, CM regarding Dc planning.     .   Elevated blood pressure.  The patient has no history of hypertension.  We will monitor the patient's blood pressure closely. If the average blood pressure readings remain elevated, we will initiate antihypertensive medication for new onset essential hypertension. TSH level is high, will check T3, T4..     .   Thrombocytopenia.  The etiology is not clear at this time.  As patient is asymptomatic, will monitor the patient's platelet count.    .    Osteoarthritis. Stable.      Body mass index is 23.86 kg/m??.    Code status: Full  Prophylaxis: Lovenox  Recommended Disposition: Home w/Family    Subjective:     Chief Complaint / Reason for Physician Visit  "Stroke".  Discussed with RN events overnight.     Objective:     VITALS:   Last 24hrs VS reviewed since prior progress note. Most recent are:  Patient Vitals for the past 24 hrs:   Temp Pulse Resp BP SpO2   12/20/17 1500 ??? 66 24 103/50 91 %   12/20/17 1400 ??? (!) 47 18 108/53 98 %   12/20/17 1345 ??? (!) 56 14 116/58 96 %   12/20/17 1253 ??? 76 17 136/68 94 %   12/20/17 1246 ??? 78 19 143/78 93 %   12/20/17 1200 98.1 ??F (36.7 ??C) 69 17 136/58 92 %   12/20/17 1100 ??? 72 14 (!) 161/111 95 %   12/20/17 1030 ??? 74 23 119/81 95 %   12/20/17 1000 ??? 67 16 122/46 92 %   12/20/17 0930 ??? 73 17 138/61 94 %   12/20/17 0900 ??? 65 15 148/61 96 %   12/20/17 0837 ??? 74 16 (!) 150/94 (!) 88 %   12/20/17 0830 ??? 90 21 ??? ???   12/20/17 0808 ??? 72 16 120/71 96 %   12/20/17 0800 97.6 ??F (36.4 ??C) 63 19 (!) 117/99 96 %   12/20/17 0700 ??? 70 19 162/67 97 %    12/20/17 0600 ??? 61 22 135/65 96 %   12/20/17 0500 ??? (!) 55 16 152/73 97 %   12/20/17 0400 97.5 ??F (36.4 ??C) 71 21 (!) 134/91 96 %   12/20/17 0300 ??? 76 21 128/69 93 %   12/20/17 0200 ??? 63 15 130/51 93 %   12/20/17 0100 ??? 69 21 124/53 96 %   12/20/17 0000 97.9 ??F (36.6 ??C) 62 10 120/54 95 %   12/19/17 2354 ??? 60 16 125/58 95 %   12/19/17 2300 ??? 66 16 ??? 99 %   12/19/17 2247 98 ??F (36.7 ??C) 69 20 147/59 96 %   12/19/17 2145 ??? 95 ??? 142/80 ???   12/19/17 2130 ??? 88 ??? 132/52 ???   12/19/17 2115 ??? 71 17 148/70 96 %   12/19/17 2100 ??? 78 22 168/71 97 %   12/19/17 2045 ??? 71 21 177/75 96 %   12/19/17 2030 98.2 ??F (36.8 ??C) 69 17 108/81  98 %   12/19/17 2015 ??? 67 16 160/76 96 %   12/19/17 2007 ??? 66 19 140/59 92 %   12/19/17 2000 98.3 ??F (36.8 ??C) 76 19 153/53 95 %   12/19/17 1933 98.2 ??F (36.8 ??C) 69 9 136/65 97 %   12/19/17 1830 ??? 84 18 163/71 97 %       Intake/Output Summary (Last 24 hours) at 12/20/2017 1615  Last data filed at 12/20/2017 1300  Gross per 24 hour   Intake 3533.8 ml   Output 900 ml   Net 2633.8 ml        PHYSICAL EXAM:  General: WD, WN. Alert, cooperative, no acute distress????  EENT:  EOMI. Anicteric sclerae. MMM  Resp:  CTA bilaterally, no wheezing or rales.  No accessory muscle use  CV:  Regular  rhythm,?? No edema  GI:  Soft, Non distended, Non tender. ??+Bowel sounds  Neurologic:?? Alert and oriented X 3, normal speech,   Psych:???? Good insight.??Not anxious nor agitated  Skin:  No rashes.  No jaundice    Reviewed most current lab test results and cultures  YES  Reviewed most current radiology test results   YES  Review and summation of old records today    NO  Reviewed patient's current orders and MAR    YES  PMH/SH reviewed - no change compared to H&P  ________________________________________________________________________  Care Plan discussed with:    Comments   Patient y    Family      RN y    Buyer, retailCare Manager     Consultant                             ________________________________________________________________________      Comments   >50% of visit spent in counseling and coordination of care y    ________________________________________________________________________  Abran DukeHammad Waverley Krempasky, MD     Procedures: see electronic medical records for all procedures/Xrays and details which were not copied into this note but were reviewed prior to creation of Plan.      LABS:  I reviewed today's most current labs and imaging studies.  Pertinent labs include:  Recent Labs     12/19/17  1804   WBC 5.8   HGB 14.5   HCT 44.5   PLT 91*     Recent Labs     12/20/17  0540 12/19/17  1804   NA 142 137   K 4.0 3.9   CL 111* 103   CO2 24 26   GLU 83 107*   BUN 10 12   CREA 1.00 1.04   CA 8.6 8.8   MG 2.0  --    PHOS 2.4*  --    ALB 3.2* 3.5   TBILI 1.3* 1.0   SGOT 39* 38*   ALT 41 42       Signed: Abran DukeHammad Tabor Bartram, MD

## 2017-12-20 NOTE — Progress Notes (Signed)
0730 Report given to Bernadene PersonStevie Jackson RN using [employer] format

## 2017-12-20 NOTE — Progress Notes (Addendum)
0730 Shift Summary--Pt has remained neuro intact-- Received 1 liter NS bolus and started on neo gtt to achieve BP > 140-160 systolic--He has received Plavix, ASA, and lipitor--MRI screening sheet completed--As pt has had hip replacement 6 years ago, need to check on hardware compatibility--Awaiting assessment by Dr Mack GuiseLoy-- Jackquline Berlinravia Binford NP aware of pt admission and updated on events of the night--Pt remains in ventricular bigeminy--electrolytes normal

## 2017-12-20 NOTE — Progress Notes (Signed)
PCCM    In ICU for management of CVA    Pt with waxing and waning sx over some time presented with difficulty speaking.  CTA head with some decreased flow in a small branch of the posterior MCA.  Not felt to be a candidate for tpa or other intervention.  Has been admitted to the icu for frequent neuro checks.  He has been intact per RN  Has been started on neo to augment bp to maintain an SBP of 160.  Brain MRI and neuro evaluations have been ordered.  Patient is on RA    Will be available to see formally if needed    Danny GarnerShawn C Jauna Raczynski, MD

## 2017-12-21 ENCOUNTER — Inpatient Hospital Stay: Admit: 2017-12-21 | Payer: MEDICARE | Primary: Family

## 2017-12-21 LAB — ECHOCARDIOGRAM COMPLETE 2D W DOPPLER W COLOR: Left Ventricular Ejection Fraction: 68

## 2017-12-21 LAB — CBC W/O DIFF
ABSOLUTE NRBC: 0 10*3/uL (ref 0.00–0.01)
ABSOLUTE NRBC: 0 10*3/uL (ref 0.00–0.01)
HCT: 41.1 % (ref 36.6–50.3)
HCT: 42.1 % (ref 36.6–50.3)
HGB: 13.5 g/dL (ref 12.1–17.0)
HGB: 14.7 g/dL (ref 12.1–17.0)
MCH: 33.5 PG (ref 26.0–34.0)
MCH: 34.6 PG — ABNORMAL HIGH (ref 26.0–34.0)
MCHC: 32.8 g/dL (ref 30.0–36.5)
MCHC: 34.9 g/dL (ref 30.0–36.5)
MCV: 102 FL — ABNORMAL HIGH (ref 80.0–99.0)
MCV: 99.1 FL — ABNORMAL HIGH (ref 80.0–99.0)
MPV: 11.3 FL (ref 8.9–12.9)
MPV: 10.9 FL (ref 8.9–12.9)
NRBC: 0 PER 100 WBC
NRBC: 0 PER 100 WBC
PLATELET: 80 10*3/uL — ABNORMAL LOW (ref 150–400)
PLATELET: 70 10*3/uL — ABNORMAL LOW (ref 150–400)
RBC: 4.03 M/uL — ABNORMAL LOW (ref 4.10–5.70)
RBC: 4.25 M/uL (ref 4.10–5.70)
RDW: 14 % (ref 11.5–14.5)
RDW: 13.6 % (ref 11.5–14.5)
WBC: 5.4 10*3/uL (ref 4.1–11.1)
WBC: 6.4 10*3/uL (ref 4.1–11.1)

## 2017-12-21 LAB — METABOLIC PANEL, BASIC
Anion gap: 6 mmol/L (ref 5–15)
BUN/Creatinine ratio: 10 — ABNORMAL LOW (ref 12–20)
BUN: 11 MG/DL (ref 6–20)
CO2: 25 mmol/L (ref 21–32)
Calcium: 8.3 MG/DL — ABNORMAL LOW (ref 8.5–10.1)
Chloride: 113 mmol/L — ABNORMAL HIGH (ref 97–108)
Creatinine: 1.12 MG/DL (ref 0.70–1.30)
GFR est AA: >60 mL/min/{1.73_m2} (ref >60)
GFR est non-AA: >60 mL/min/{1.73_m2} (ref >60)
Glucose: 84 mg/dL (ref 65–100)
Potassium: 4 mmol/L (ref 3.5–5.1)
Sodium: 144 mmol/L (ref 136–145)

## 2017-12-21 LAB — T3, FREE: Free Triiodothyronine (T3): 2.6 pg/mL (ref 2.2–4.0)

## 2017-12-21 LAB — T4, FREE: T4, Free: 0.9 NG/DL (ref 0.8–1.5)

## 2017-12-21 MED ORDER — APIXABAN 5 MG TABLET
5 mg | Freq: Two times a day (BID) | ORAL | Status: DC
Start: 2017-12-21 — End: 2017-12-24
  Administered 2017-12-21 – 2017-12-24 (×6): via ORAL

## 2017-12-21 MED ORDER — SODIUM CHLORIDE 0.9 % IJ SYRG
Freq: Three times a day (TID) | INTRAMUSCULAR | Status: DC
Start: 2017-12-21 — End: 2017-12-22
  Administered 2017-12-22 (×2): via INTRAVENOUS

## 2017-12-21 MED ORDER — CEFAZOLIN 2 GRAM/20 ML IN STERILE WATER INTRAVENOUS SYRINGE
2 gram/0 mL | INTRAVENOUS | Status: AC
Start: 2017-12-21 — End: 2017-12-22

## 2017-12-21 MED ORDER — FLU VACCINE QV 2018-19 (6 MOS+)(PF) 60 MCG (15 MCG X 4)/0.5 ML IM SYRINGE
60 mcg (15 mcg x 4)/0.5 mL | INTRAMUSCULAR | Status: DC
Start: 2017-12-21 — End: 2017-12-24

## 2017-12-21 MED ORDER — CEFAZOLIN 2 GRAM/20 ML IN STERILE WATER INTRAVENOUS SYRINGE
2 gram/0 mL | INTRAVENOUS | Status: DC
Start: 2017-12-21 — End: 2017-12-21

## 2017-12-21 MED ORDER — SODIUM CHLORIDE 0.9 % IJ SYRG
INTRAMUSCULAR | Status: DC | PRN
Start: 2017-12-21 — End: 2017-12-22

## 2017-12-21 MED ORDER — SODIUM CHLORIDE 0.9 % IJ SYRG
Freq: Once | INTRAMUSCULAR | Status: AC
Start: 2017-12-21 — End: 2017-12-21
  Administered 2017-12-21: 14:00:00 via INTRAVENOUS

## 2017-12-21 MED FILL — CHILDREN'S ASPIRIN 81 MG CHEWABLE TABLET: 81 mg | ORAL | Qty: 1

## 2017-12-21 MED FILL — ELIQUIS 5 MG TABLET: 5 mg | ORAL | Qty: 1

## 2017-12-21 MED FILL — NORMAL SALINE FLUSH 0.9 % INJECTION SYRINGE: INTRAMUSCULAR | Qty: 20

## 2017-12-21 MED FILL — ATORVASTATIN 40 MG TAB: 40 mg | ORAL | Qty: 1

## 2017-12-21 MED FILL — HYDRALAZINE 20 MG/ML IJ SOLN: 20 mg/mL | INTRAMUSCULAR | Qty: 1

## 2017-12-21 NOTE — Consults (Signed)
Consults by Thurston Pounds, MD at 12/21/17 1459                Author: Thurston Pounds, MD  Service: Cardiology  Author Type: Physician       Filed: 12/21/17 1833  Date of Service: 12/21/17 1459  Status: Addendum          Editor: Thurston Pounds, MD (Physician)          Related Notes: Original Note by Thurston Pounds, MD (Physician) filed at 12/21/17 1831                         Cardiac Electrophysiology Hospital Consultation Note          Subjective:         [name] is a 84 y.o.  patient who is seen for evaluation of atrial fibrillation & bradycardia, likely SSS.      He was admitted on 12-30-17 after presenting to the ED with slurred speech.  Reported intermittent slurred speech, dizziness, & gait abnormalities for months, but acutely worsened on date of admission & was accompanied by right-sided facial droop.   Head CT showed possible acute infarction in posterior left frontal lobe. MRI brain showed acute small vessel infarcts.      Also found to be in AF on admission.  He has started Eliquis 5 mg po bid for embolic CVA prophylaxis (CHADS2VASC 5).  No prior known history of AF.  Intermittent significant sinus bradycardia down to 30 bpm.        TSH 8.7, T3 & T4 WNL.  Hypertensive intermittently.  BP this afternoon 135/65.  Sinus rhythm with ventricular bigeminy & trigeminy on telemetry.      Prior to admission, he reports intermittent lightheadedness/dizziness when standing quickly or exerting himself.  Denies chest pain, palpitations, SOB, PND, orthopnea, syncope, or edema.      No family history of pacemakers.         Echo (12/21/2017): LVEF 65-70%, no RWMA.  RV mod dilated.  RA dilated.  Mild MR.  Mild to mod TR.      MRI brain (12/30/17): Punctate foci of diffusion restriction in left frontal lobe compatible with acute small vessel infarcts.  No evidence of intracranial hemorrhage or mass.      CTA head & neck (12-30-17): No evidence large vessel occlusion.  Small branch post MCA in sylvian fissure on  series 2 image 429, gradually tapers to occlusion.               Problem List   Date Reviewed:  2017/12/30                        Codes  Class  Noted             * (Principal) Acute CVA (cerebrovascular accident) [facility])  ICD-10-CM: I63.9   ICD-9-CM: 434.91    2017/12/30                            Current Facility-Administered Medications             Medication  Dose  Route  Frequency  Provider  Last Rate  Last Dose              ?  influenza vaccine 2018-19 (6 mos+)(PF) (FLUARIX QUAD/FLULAVAL QUAD) injection 0.5 mL   0.5 mL  IntraMUSCular  PRIOR TO DISCHARGE  Abran Duke, MD                    ?  apixaban Everlene Balls) tablet 5 mg   5 mg  Oral  BID  Christel Mormon M, DO           ?  hydrALAZINE (APRESOLINE) 20 mg/mL injection 10 mg   10 mg  IntraVENous  Q6H PRN  Binford, Anabel Halon, NP           ?  acetaminophen (TYLENOL) tablet 650 mg   650 mg  Oral  Q4H PRN  Eniola, Razaak A, MD           ?  sodium chloride (NS) flush 5-10 mL   5-10 mL  IntraVENous  Q8H  Eniola, Razaak A, MD     10 mL at 12/21/17 1443     ?  sodium chloride (NS) flush 5-10 mL   5-10 mL  IntraVENous  PRN  Eniola, Razaak A, MD           ?  acetaminophen (TYLENOL) tablet 650 mg   650 mg  Oral  Q4H PRN  Eniola, Razaak A, MD                Or              ?  acetaminophen (TYLENOL) solution 650 mg   650 mg  Per NG tube  Q4H PRN  Eniola, Razaak A, MD                Or              ?  acetaminophen (TYLENOL) suppository 650 mg   650 mg  Rectal  Q4H PRN  Eniola, Razaak A, MD           ?  ondansetron (ZOFRAN) injection 4 mg   4 mg  IntraVENous  Q6H PRN  Eniola, Razaak A, MD           ?  atorvastatin (LIPITOR) tablet 40 mg   40 mg  Oral  QHS  Eniola, Razaak A, MD     40 mg at 12/20/17 2255              ?  bisacodyl (DULCOLAX) suppository 10 mg   10 mg  Rectal  DAILY PRN  Noralyn Pick, MD              No Known Allergies     Past Medical History:        Diagnosis  Date         ?  Arthritis       ?  Chronic pain            RIGHT HIP        History reviewed. No  pertinent surgical history.     Family History         Problem  Relation  Age of Onset          ?  Cancer  Mother                BREAST CA          ?  Heart Disease  Father            Social History          Tobacco Use         ?  Smoking status:  Never Smoker     ?  Smokeless tobacco:  Never Used       Substance Use Topics         ?  Alcohol use:  Yes             Comment: 2/WEEK VODKA            Review of Systems:    Constitutional: Negative for fever, chills, weight loss, malaise/fatigue.    HEENT: Negative for nosebleeds, vision changes.    Respiratory: Negative for cough, hemoptysis   Cardiovascular: Negative for chest pain, palpitations, orthopnea, claudication, leg swelling, syncope, and PND. + occasional lightheadedness/dizziness.   Gastrointestinal: Negative for nausea, vomiting, diarrhea, blood in stool and melena.    Genitourinary: Negative for dysuria, and hematuria.    Musculoskeletal: Negative for myalgias, arthralgia.    Skin: Negative for rash.    Heme: Does not bleed or bruise easily.    Neurological: + recent slurred speech & right facial droop, resolved.         Objective:        Visit Vitals      BP  135/65     Pulse  80     Temp  98 ??F (36.7 ??C)     Resp  14     Ht  6' (1.829 m)     Wt  182 lb 5.1 oz (82.7 kg)     SpO2  (!) 86%        BMI  24.73 kg/m??         Physical Exam:     Constitutional: well-developed and well-nourished. No respiratory distress.    Head: Normocephalic and atraumatic.    Eyes: Pupils are equal, round   ENT: hearing normal   Neck: supple. No JVD present.    Cardiovascular: Normal rate, irregular rhythm. Exam reveals no gallop and no friction rub. No murmur heard.   Pulmonary/Chest: Effort normal and breath sounds normal. No wheezes.    Abdominal: Soft, no tenderness.   Musculoskeletal: no edema.   Neurological: alert, oriented.    Skin: Skin is warm and dry  Psychiatric: normal mood and affect. Behavior is normal. Judgment and thought content normal.        EKG (12/19/2017):   AF, ventricular rate 91, bigeminy, fusion complexes.           Assessment/Plan:     [name] was admitted with acute left frontal lobe ischemic stroke, symptomatic with slurred speech & right facial droop at time of admission.      New atrial fibrillation, though has not been tachycardic.  CHADS2VASC 5, continue with newly prescribed Eliquis 5 mg po bid for further embolic CVA prophylaxis.  Intermittent sinus bradycardia down to 30.  Occasional lightheadedness/dizziness may be associated  symptoms.  Ventricular bigeminy & trigeminy noted as well.      Echo shows normal LVEF & mild MR, mild to moderate TR.      TSH elevated, but T3/T4 WNL.  Electrolytes without significant abnormality.      Dual chamber pacemaker would prevent further bradycardia & allow for medical management of atrial fibrillation.  Risks/benefits reviewed with patient & son, & they would like to proceed. Tentatively scheduled for 12/22/2017 AM.      Costella HatcherMeredith Sherman, FNP-C   [facility]   12/21/17      Addendum from EP attending:    I have seen, examined patient, and discussed with nurse practitioner, registered nurse, reviewed, updated note and agree with the  assessment and plan     I have talked to him tonight    He is better, no paralysis.  Speech is better   He had left MCA acute stroke that appears to correlate with new diagnosis of AFIB   Vital signs as above   Exam shows irregular rhythm and no rub   No edema   Assessment and Plan:   He had AFIB with slow ventricular rate at times (slowed down to 30 bpm with AFIB PVCs).  It is av node conduction problem in the absence of medication   This is irreversible   It is not sure if he had AFIB before so this could be paroxysmal so I recommend dual chamber pacer   He agrees to proceed   Stay on eliquis so there is risk of bleeding more than usual (platelet is also on low side)   This is set for 7 am tomorrow   Risks involve device implant include but are not limited  to bleeding, infection, valvular damage, heart attack, stroke, lung collapse  (pneumothorax or hemothorax), heart collapse (pericardial tamponade), heart perforation, kidney failure, death. Elective or emergency surgery may be required to repair some of these complications. Prolonged hospitalization would be required.  General anesthesia may be required for the procedure.         Thank you for involving me in this patient's care and please call with further concerns or questions.         Juliet Rude, M.D.   Electrophysiology/Cardiology   [facility]   [address]                      [address] [place], [place] [address]                             [place] [address]   [phone]                                        [phone]

## 2017-12-21 NOTE — Consults (Signed)
CARDIOLOGY CONSULT NOTE     [facility]        [address], [address], [place] [address]   [phone] fax [phone]    Name: [name]  Oct 20, 1932 [id number]  12/21/2017 11:00 AM     Assessment/Plan:      1.  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  Atrial Fib - with some significant bradycardia, likely has SSS, will consult EP/Dr. Loma Newton regarding candidacy for pacemaker placement, CHADS2VASC=5 for age, vascular disease, CVA, recommend OAC with eliquis when ok with neurology and hematology  -TTE pending  -TSH 8.7, will check T3 and T4  3.  Elevated blood pressure - continue to monitor  4.  Thrombocytopenia - etiology unknown at this time, will consult hematology for evaluation       Fam hx: no early CAD, Afib, CHF, pacemakers  Soc hx: no tobacco use, one alcoholic beverage nightly     Admit Date: 12/19/2017     Admit Diagnosis: Acute CVA (cerebrovascular accident) [facility])  Primary Care Physician:None     Attending Provider: Abran Duke, MD  Primary Cardiologist: None  Consulting Cardiologist: Dr. Meredith Mody    REASON FOR CONSULT: bradycardia  Requesting Physician: Dr. Sharol Harness    Subjective:     [name] is a 84 y.o. male admitted for Acute CVA (cerebrovascular accident) (HCC).  He is an 84 year old man with a past medical history significant for arthritis who does not have a PCP or see physicians regularly.  He was in his usual state of health until the day of presentation when he developed slurred speech.  The patient has been having intermittent dizziness, gait abnormalities for months but developed slurred speech with associated right-sided facial droop.  When the patient arrived at the emergency room, code stroke was called.   CT scan of the head was obtained and the emergency room physician consulted the neurologist through the tele neurology service.  It was determined that the patient was not a TPA candidate.  Brain MRI revealed acute frontal lobe CVA and he was admitted to the ICU.  He was found to have PVCs and Atrial Fibrillation on telemetry with periods of significant bradycardia.  He denies any hx of HTN, dyslipidemia, TIA/CVA, CAD, MI, CHF.  He does not go to physicians regularly.  He denies any chest pain, palpitations or dyspnea with exertion.  He denies any syncope.  He reports being unsteady on his feet, especially with position changes but denies any dizziness.  He denies being told that he snores or has apnea while he sleeps.        Review of Symptoms:  A comprehensive review of systems was negative except for that written in the HPI.    Previous treatment/evaluation includes none .  Cardiac risk factors: male gender, stress.    Past Medical History:   Diagnosis Date   ??? Arthritis    ??? Chronic pain     RIGHT HIP     History reviewed. No pertinent surgical history.  Current Facility-Administered Medications   Medication Dose Route Frequency   ??? influenza vaccine 2018-19 (6 mos+)(PF) (FLUARIX QUAD/FLULAVAL QUAD) injection 0.5 mL  0.5 mL IntraMUSCular PRIOR TO DISCHARGE   ??? aspirin  chewable tablet 81 mg  81 mg Oral DAILY   ??? hydrALAZINE (APRESOLINE) 20 mg/mL injection 10 mg  10 mg IntraVENous Q6H PRN   ??? acetaminophen (TYLENOL) tablet 650 mg  650 mg Oral Q4H PRN   ??? sodium chloride (NS) flush 5-10 mL  5-10 mL IntraVENous Q8H   ??? sodium chloride (NS) flush 5-10 mL  5-10 mL IntraVENous PRN   ??? acetaminophen (TYLENOL) tablet 650 mg  650 mg Oral Q4H PRN    Or   ??? acetaminophen (TYLENOL) solution 650 mg  650 mg Per NG tube Q4H PRN    Or   ??? acetaminophen (TYLENOL) suppository 650 mg  650 mg Rectal Q4H PRN   ??? ondansetron (ZOFRAN) injection 4 mg  4 mg IntraVENous Q6H PRN   ??? atorvastatin (LIPITOR) tablet 40 mg  40 mg Oral QHS   ???  bisacodyl (DULCOLAX) suppository 10 mg  10 mg Rectal DAILY PRN       No Known Allergies       Objective:      Physical Exam  Vitals:    12/21/17 0800 12/21/17 0900 12/21/17 1000 12/21/17 1100   BP: (!) 140/92 152/74 145/70 154/67   Pulse: (!) 58 74 61 66   Resp: 16 15 15 16    Temp: 98.1 ??F (36.7 ??C)      SpO2: 95% 98% 96%    Weight:       Height:           General:  Alert, cooperative, no distress, appears stated age.   Eyes:  Conjunctivae/corneas clear.     Ears:  Normal external ear canals both ears.   Nose: Nares normal.    Mouth/Throat: Moist mucous membranes.     Neck: Supple, symmetrical, trachea midline, no carotid bruit and no JVD.   Back:   Symmetric, no curvature. ROM normal.    Lungs:   Clear to auscultation bilaterally.   Heart:  Irregular rate and rhythm, S1, S2 normal, no murmur, click, rub or gallop.   Abdomen:   Soft, non-tender. Bowel sounds normal.    Extremities: Extremities normal, atraumatic, no cyanosis or edema.   Vascular: 2+ and symmetric all extremities.   Skin: Skin color normal. No rashes or lesions   Lymph nodes: Not assessed   Neurologic: CNII-XII intact. Normal strength throughout.       Telemetry: AFIB with periods of bradycardia down to 30bpm, frequent PVCs  ECG: atrial fibrillation, rate 91bpm, PVCs, non-specific ST-T wave changes     Data Review:     Recent Labs     12/20/17  0540 12/19/17  2139   CPK 62 53   TROIQ <0.05 <0.05     Recent Labs     12/21/17  0548 12/20/17  0540   NA 144 142   K 4.0 4.0   CL 113* 111*   CO2 25 24   BUN 11 10   CREA 1.12 1.00   GLU 84 83   PHOS  --  2.4*   CA 8.3* 8.6     Recent Labs     12/21/17  0548 12/19/17  1804   WBC 5.4 5.8   HGB 13.5 14.5   HCT 41.1 44.5   PLT 80* 91*     Recent Labs     12/20/17  0540 12/19/17  1804   SGOT 39* 38*   AP 54 61     Recent Labs     12/20/17  0540  CHOL 145   LDLC 87.2     Recent Labs     12/20/17  0540 12/19/17  2139   CRP  --  <0.29   TSH 8.79*  --      Thank you very much for this referral. I appreciate the  opportunity to participate in this patient's care. I will follow along with above stated plan.    [name], MD  [facility]  [address]  [place], [place] [address]  [phone]    FA:OZHY

## 2017-12-21 NOTE — Consults (Signed)
[name]    Name:[name], [name]  MR#: [id number]  DOB: 1932-03-28  ACCOUNT #: [id number]   DATE OF SERVICE: 12/21/2017    HISTORY OF PRESENT ILLNESS:  The patient is an 84 year old gentleman with a recent admission for altered mental status and CVA, who is seen for initial hematology evaluation regarding thrombocytopenia.  This patient has not seen a physician for many years, and was in his usual state of good health until the morning of 01/05, when he presented to the ER with slurred speech.  A head CT scan in the ER identified a possible infarction in the posterior left frontal lobe.  Upon admission, he was also found to be in atrial fibrillation, and Dr. [name] was consulted.  This patient was found to have thrombocytopenia upon admission, with a platelet count of 91,000, and therefore was not considered to be a candidate for tPA.    PAST MEDICAL HISTORY:  Unremarkable.    PAST SURGICAL HISTORY:  He has had a right total hip replacement.    SOCIAL HISTORY:  He is a retired [employer] information [employer] manager.  He drinks alcohol socially, and does not smoke.    FAMILY HISTORY:  His nephew has a history of "easy bleeding and bruising," but also takes Coumadin.    REVIEW OF SYSTEMS:  Denies fevers, night sweats, or unintentional weight loss.    PHYSICAL EXAMINATION:  GENERAL:  A pleasant, well-developed, well-nourished male, in no apparent distress.  VITAL SIGNS:  Stable.  He is afebrile.  HEENT:  EOMI, PERRLA.  Oropharynx without lesions.  NECK:  Supple.  No lymphadenopathy noted.  LUNGS:  Clear.  HEART:  Irregular rate and rhythm, I/VI systolic murmur.  ABDOMEN:  Soft, nontender, no hepatosplenomegaly noted.  EXTREMITIES:  No clubbing, cyanosis, or edema.  NEUROLOGIC:  AO x3, nonfocal.    LABORATORY DATA:  White count 5.4, hemoglobin 13.5, platelet count 80,000.  He had a normal differential on 01/05.      ASSESSMENT AND PLAN:  Thrombocytopenia in an otherwise previously healthy 84 year old gentleman.   Differential diagnosis for this includes ITP, marrow infiltrative processes, such as myelodysplasia, lymphoma, and myelofibrosis, occult splenomegaly can cause splenic sequestration and isolated cytopenias, nutritional deficiencies can cause thrombocytopenia as well.  In vitro aggregation and platelets can cause thrombocytopenia, and this can be easily ruled out by drawing blood in citrate.  I reviewed his past CBCs from 2013, and he had a borderline low platelet count at that time, measured at 150.  For the time being, we will do a serologic evaluation to rule out those things that we can.  We will perform an ultrasound of the abdomen and consider doing a bone marrow biopsy if the counts continue to drop.  The patient was not on any medications before his admission, so therefore drug-induced thrombocytopenia is not in the differential.  Finally, patient does have thrombocytopenia; however, oral anticoagulation therapy is considered to be safe in patients with atrial fibrillation, as long as the platelet count is over 50,000, and I agree with continuing Eliquis at this time.  We will follow with you on behalf of [employer].      Jaci Carrel, MD       JPE / CN  D: 12/21/2017 16:11     T: 12/21/2017 17:11  JOB #: [id number]

## 2017-12-21 NOTE — Consults (Addendum)
Cardiac Electrophysiology Hospital Consultation Note     Subjective:      [name] is a 84 y.o. patient who is seen for evaluation of atrial fibrillation & bradycardia, likely SSS.    He was admitted on 12/19/2017 after presenting to the ED with slurred speech.  Reported intermittent slurred speech, dizziness, & gait abnormalities for months, but acutely worsened on date of admission & was accompanied by right-sided facial droop.  Head CT showed possible acute infarction in posterior left frontal lobe. MRI brain showed acute small vessel infarcts.    Also found to be in AF on admission.  He has started Eliquis 5 mg po bid for embolic CVA prophylaxis (CHADS2VASC 5).  No prior known history of AF.  Intermittent significant sinus bradycardia down to 30 bpm.      TSH 8.7, T3 & T4 WNL.  Hypertensive intermittently.  BP this afternoon 135/65.  Sinus rhythm with ventricular bigeminy & trigeminy on telemetry.    Prior to admission, he reports intermittent lightheadedness/dizziness when standing quickly or exerting himself.  Denies chest pain, palpitations, SOB, PND, orthopnea, syncope, or edema.    No family history of pacemakers.      Echo (12/21/2017): LVEF 65-70%, no RWMA.  RV mod dilated.  RA dilated.  Mild MR.  Mild to mod TR.    MRI brain (12/19/2017): Punctate foci of diffusion restriction in left frontal lobe compatible with acute small vessel infarcts.  No evidence of intracranial hemorrhage or mass.    CTA head & neck (12/19/2017): No evidence large vessel occlusion.  Small branch post MCA in sylvian fissure on series 2 image 429, gradually tapers to occlusion.        Problem List  Date Reviewed: 12/19/2017          Codes Class Noted    * (Principal) Acute CVA (cerebrovascular accident) [facility](HCC) ICD-10-CM: I63.9  ICD-9-CM: 434.91  12/19/2017              Current Facility-Administered Medications   Medication Dose Route Frequency Provider Last Rate Last Dose    ??? influenza vaccine 2018-19 (6 mos+)(PF) (FLUARIX QUAD/FLULAVAL QUAD) injection 0.5 mL  0.5 mL IntraMUSCular PRIOR TO DISCHARGE Abran DukeHafeez, Hammad, MD       ??? apixaban (ELIQUIS) tablet 5 mg  5 mg Oral BID Hulan Saasonaldson, Rachel M, DO       ??? hydrALAZINE (APRESOLINE) 20 mg/mL injection 10 mg  10 mg IntraVENous Q6H PRN Binford, Anabel Halonravia M, NP       ??? acetaminophen (TYLENOL) tablet 650 mg  650 mg Oral Q4H PRN Eniola, Razaak A, MD       ??? sodium chloride (NS) flush 5-10 mL  5-10 mL IntraVENous Q8H Eniola, Razaak A, MD   10 mL at 12/21/17 1443   ??? sodium chloride (NS) flush 5-10 mL  5-10 mL IntraVENous PRN Eniola, Razaak A, MD       ??? acetaminophen (TYLENOL) tablet 650 mg  650 mg Oral Q4H PRN Eniola, Razaak A, MD        Or   ??? acetaminophen (TYLENOL) solution 650 mg  650 mg Per NG tube Q4H PRN Eniola, Razaak A, MD        Or   ??? acetaminophen (TYLENOL) suppository 650 mg  650 mg Rectal Q4H PRN Eniola, Razaak A, MD       ??? ondansetron (ZOFRAN) injection 4 mg  4 mg IntraVENous Q6H PRN Eniola, Razaak A, MD       ??? atorvastatin (  LIPITOR) tablet 40 mg  40 mg Oral QHS Eniola, Razaak A, MD   40 mg at 12/20/17 2255   ??? bisacodyl (DULCOLAX) suppository 10 mg  10 mg Rectal DAILY PRN Noralyn Pick, MD         No Known Allergies  Past Medical History:   Diagnosis Date   ??? Arthritis    ??? Chronic pain     RIGHT HIP     History reviewed. No pertinent surgical history.  Family History   Problem Relation Age of Onset   ??? Cancer Mother         BREAST CA   ??? Heart Disease Father      Social History     Tobacco Use   ??? Smoking status: Never Smoker   ??? Smokeless tobacco: Never Used   Substance Use Topics   ??? Alcohol use: Yes     Comment: 2/WEEK VODKA        Review of Systems:   Constitutional: Negative for fever, chills, weight loss, malaise/fatigue.   HEENT: Negative for nosebleeds, vision changes.   Respiratory: Negative for cough, hemoptysis  Cardiovascular: Negative for chest pain, palpitations, orthopnea,  claudication, leg swelling, syncope, and PND. + occasional lightheadedness/dizziness.  Gastrointestinal: Negative for nausea, vomiting, diarrhea, blood in stool and melena.   Genitourinary: Negative for dysuria, and hematuria.   Musculoskeletal: Negative for myalgias, arthralgia.   Skin: Negative for rash.   Heme: Does not bleed or bruise easily.   Neurological: + recent slurred speech & right facial droop, resolved.     Objective:     Visit Vitals  BP 135/65   Pulse 80   Temp 98 ??F (36.7 ??C)   Resp 14   Ht 6' (1.829 m)   Wt 182 lb 5.1 oz (82.7 kg)   SpO2 (!) 86%   BMI 24.73 kg/m??      Physical Exam:   Constitutional: well-developed and well-nourished. No respiratory distress.   Head: Normocephalic and atraumatic.   Eyes: Pupils are equal, round  ENT: hearing normal  Neck: supple. No JVD present.   Cardiovascular: Normal rate, irregular rhythm. Exam reveals no gallop and no friction rub. No murmur heard.  Pulmonary/Chest: Effort normal and breath sounds normal. No wheezes.   Abdominal: Soft, no tenderness.   Musculoskeletal: no edema.   Neurological: alert, oriented.   Skin: Skin is warm and dry  Psychiatric: normal mood and affect. Behavior is normal. Judgment and thought content normal.      EKG (12/19/2017): AF, ventricular rate 91, bigeminy, fusion complexes.      Assessment/Plan:   [name] was admitted with acute left frontal lobe ischemic stroke, symptomatic with slurred speech & right facial droop at time of admission.    New atrial fibrillation, though has not been tachycardic.  CHADS2VASC 5, continue with newly prescribed Eliquis 5 mg po bid for further embolic CVA prophylaxis.  Intermittent sinus bradycardia down to 30.  Occasional lightheadedness/dizziness may be associated symptoms.  Ventricular bigeminy & trigeminy noted as well.    Echo shows normal LVEF & mild MR, mild to moderate TR.    TSH elevated, but T3/T4 WNL.  Electrolytes without significant abnormality.     Dual chamber pacemaker would prevent further bradycardia & allow for medical management of atrial fibrillation.  Risks/benefits reviewed with patient & son, & they would like to proceed. Tentatively scheduled for 12/22/2017 AM.    Costella Hatcher, FNP-C  [facility]  12/21/17  Addendum from EP attending:   I have seen, examined patient, and discussed with nurse practitioner, registered nurse, reviewed, updated note and agree with the assessment and plan    I have talked to him tonight   He is better, no paralysis.  Speech is better  He had left MCA acute stroke that appears to correlate with new diagnosis of AFIB  Vital signs as above  Exam shows irregular rhythm and no rub  No edema  Assessment and Plan:  He had AFIB with slow ventricular rate at times (slowed down to 30 bpm with AFIB PVCs).  It is av node conduction problem in the absence of medication  This is irreversible  It is not sure if he had AFIB before so this could be paroxysmal so I recommend dual chamber pacer  He agrees to proceed  Stay on eliquis so there is risk of bleeding more than usual (platelet is also on low side)  This is set for 7 am tomorrow  Risks involve device implant include but are not limited to bleeding, infection, valvular damage, heart attack, stroke, lung collapse (pneumothorax or hemothorax), heart collapse (pericardial tamponade), heart perforation, kidney failure, death. Elective or emergency surgery may be required to repair some of these complications. Prolonged hospitalization would be required. General anesthesia may be required for the procedure.      Thank you for involving me in this patient's care and please call with further concerns or questions.      Juliet Rude, M.D.  Electrophysiology/Cardiology  [facility]  [address]                      [address] [place] [place], [place] [address]                             [place] [address]   [phone]                                        [phone]

## 2017-12-21 NOTE — Progress Notes (Signed)
TRANSFER - OUT REPORT:    Verbal report given to stacy(name) on Ajamu D Radel  being transferred to nstu(unit) for routine progression of care       Report consisted of patient???s Situation, Background, Assessment and   Recommendations(SBAR).     Information from the following report(s) SBAR, Kardex and MAR was reviewed with the receiving nurse.    Lines:   Peripheral IV 12/19/17 Right Antecubital (Active)   Site Assessment Clean, dry, & intact 12/21/2017  4:00 PM   Phlebitis Assessment 0 12/21/2017  4:00 PM   Infiltration Assessment 0 12/21/2017  4:00 PM   Dressing Status Clean, dry, & intact 12/21/2017  4:00 PM   Dressing Type Transparent 12/21/2017  4:00 PM   Hub Color/Line Status Pink;Capped 12/21/2017  4:00 PM   Action Taken Open ports on tubing capped 12/21/2017  4:00 PM   Alcohol Cap Used Yes 12/21/2017  4:00 PM        Opportunity for questions and clarification was provided.      Patient transported with:   Registered Nurse  Tech

## 2017-12-21 NOTE — Progress Notes (Addendum)
Problem: Self Care Deficits Care Plan (Adult)  Goal: *Acute Goals and Plan of Care (Insert Text)  Occupational Therapy Goals  Initiated 12/21/2017   1.  Patient will perform grooming standing at sink for 5 min without LOB with supervision/set-up within 7 day(s).  2.  Patient will perform lower body dressing with supervision/set-up within 7 day(s).  3.  Patient will perform bathing with supervision/set-up within 7 day(s).  4.  Patient will perform toilet transfers with supervision/set-up within 7 day(s).  5.  Patient will perform all aspects of toileting with supervision/set-up within 7 day(s).  6.  Patient will appropriately sequence steps for fully ADL routine with supervision/ set-up within 7 day(s).  7.  Patient will improved MOCA score by 5 points in preparation for functional tasks within 7 day(s).  Occupational Therapy EVALUATION  Patient: [name] (84 y.o. male)  Date: 12/21/2017  Primary Diagnosis: Acute CVA (cerebrovascular accident) [facility])       Precautions:  Fall    ASSESSMENT :  Based on the objective data described below, the patient presents with impaired standing balance, fall risk, impaired cognition, mild word finding difficulty, and decreased insight into deficits s/p admission for L MCA CVA.  Patient received sitting in chair, alert, Ox4, and agreeable to therapy.  Scored 66/66 with RUE on Fugl Meyer assessment of motor function, demonstrates BUE AROM/ strength/ coordination intact.  Patient also reports intact sensation and demonstrates intact vision/ perception.  Patient scored 16/30 on Montreal Cognitive Assessment [facility]) with points deducted for deficits in visuospatial/ executive functions, naming pictures, language/ sentence repetition, and short term memory/ novel word recall.  A normal score is 26 or above. Per physical therapist, patient with impaired standing balance requiring min-A for sit-stand/ ambulation.  Infer patient requires overall set-up to min-A for ADLs.  Patient  receptive to [facility] education on signs/ symptoms of CVA and was provided with handout.   PTA, patient is a retired Scientific laboratory technician at [employer], fully independent for ADLs/ IADLs, living alone, using no AE, driving and running errands.  Recommend inpatient rehab at d/c to maximize functional recovery.     Patient will benefit from skilled intervention to address the above impairments.  Patient???s rehabilitation potential is considered to be Good  Factors which may influence rehabilitation potential include:   []                 None noted  [x]                 Mental ability/status  []                 Medical condition  []                 Home/family situation and support systems  []                 Safety awareness  []                 Pain tolerance/management  []                 Other:      PLAN :  Recommendations and Planned Interventions:  [x]                   Self Care Training                  [x]            Therapeutic Activities  [x]   Functional Mobility Training    [x]            Cognitive Retraining  [x]                   Therapeutic Exercises           [x]            Endurance Activities  [x]                   Balance Training                   []            Neuromuscular Re-Education  []                   Visual/Perceptual Training     [x]       Home Safety Training  [x]                   Patient Education                 [x]            Family Training/Education  []                   Other (comment):    Frequency/Duration: Patient will be followed by occupational therapy 5 times a week to address goals.  Discharge Recommendations: Inpatient Rehab  Further Equipment Recommendations for Discharge: TBD     SUBJECTIVE:   Patient stated ???I'm feeling fine.???    OBJECTIVE DATA SUMMARY:   HISTORY:   Past Medical History:   Diagnosis Date   ??? Arthritis    ??? Chronic pain     RIGHT HIP   History reviewed. No pertinent surgical history.    Prior Level of Function/Environment/Context: PTA, patient is a retired  Scientific laboratory technicianeconomics/ art teacher at [employer], fully independent for ADLs/ IADLs, living alone, using no AE, driving and running errands.  Expanded or extensive additional review of patient history:     Home Situation  Home Environment: Private residence  # Steps to Enter: 3  Rails to Enter: Yes  Hand Rails : Bilateral  One/Two Story Residence: Two story  Living Alone: Yes  Support Systems: Family member(s), Friends \\ neighbors  Patient Expects to be Discharged to:: Rehabilitation facility  Current DME Used/Available at Home: None  Hand dominance: RightEXAMINATION OF PERFORMANCE DEFICITS:  Cognitive/Behavioral Status:  Neurologic State: Alert  Orientation Level: Oriented X4  Cognition: Decreased attention/concentration;Memory loss;Follows commands  Perception: Appears intact  Perseveration: No perseveration noted  Safety/Judgement: Decreased insight into deficits  Skin: visible skin appears intact  Edema: none noted  Hearing:  Auditory  Auditory Impairment: None  Vision/Perceptual:    Tracking: Able to track stimulus in all quadrants w/o difficulty              Visual Fields: (able to detect stimuli in all fields)  Diplopia: No    Acuity: Within Defined Limits;Able to read clock/calendar on wall without difficulty;Able to read employee name badge without difficulty       Range of Motion:    AROM: Within functional limits                       Strength:    Strength: Within functional limits              Coordination:  Coordination: Within functional limits  Fine Motor Skills-Upper: Left Intact;Right Intact  Gross Motor Skills-Upper: Left Intact;Right Intact  Tone & Sensation:    Tone: Normal  Sensation: Intact                    Balance:  Sitting: Intact  Standing: Impaired  Standing - Static: Fair  Standing - Dynamic : Fair  Functional Mobility and Transfers for ADLs:Bed Mobility:  Supine to Sit: Stand-by assistance    Transfers:  Sit to Stand: Minimum assistance           ADL Assessment:  Feeding: Independent(inferred)     Oral Facial Hygiene/Grooming: Setup(inferred seated)    Bathing: Setup(inferred seated)    Upper Body Dressing: Setup(inferred)    Lower Body Dressing: Minimum assistance(inferred for standing/ steadying)    Toileting: Minimum assistance(inferred for standing/ steadying)              ADL Intervention and task modifications:                                     Cognitive Retraining  Safety/Judgement: Decreased insight into deficits        Functional Measure:   Barthel Index:    Bathing: 0  Bladder: 10  Bowels: 10  Grooming: 5  Dressing: 5  Feeding: 10  Mobility: 0  Stairs: 0  Toilet Use: 5  Transfer (Bed to Chair and Back): 10  Total: 55       The Barthel ADL Index: Guidelines  1. The index should be used as a record of what a patient does, not as a record of what a patient could do.  2. The main aim is to establish degree of independence from any help, physical or verbal, however minor and for whatever reason.  3. The need for supervision renders the patient not independent.  4. A patient's performance should be established using the best available evidence. Asking the patient, friends/relatives and nurses are the usual sources, but direct observation and common sense are also important. However direct testing is not needed.  5. Usually the patient's performance over the preceding 24-48 hours is important, but occasionally longer periods will be relevant.  6. Middle categories imply that the patient supplies over 50 per cent of the effort.  7. Use of aids to be independent is allowed.    Clarisa Kindred., Barthel, D.W. [phone]). Functional evaluation: the Barthel Index. Md State Med J (14)2.  Zenaida Niece der Ammon, J.J.M.F, [place], Ian Malkin., Margret Chance., [place], [place]. (1999). Measuring the change indisability after inpatient rehabilitation; comparison of the responsiveness of the Barthel Index and Functional Independence Measure. Journal of Neurology, Neurosurgery, and Psychiatry, 66(4), [phone].   Dawson Bills, N.J.A, Scholte op Watson,  W.J.M, & Koopmanschap, M.A. (2004.) Assessment of post-stroke quality of life in cost-effectiveness studies: The usefulness of the Barthel Index and the EuroQoL-5D. Quality of Life Research, 13, 427-43     Fugl-Meyer Assessment of Motor Recovery after Stroke:     Reflex Activity  Flexors/Biceps/Fingers: Can be elicited  Extensors/Triceps: Can be elicited  Reflex Subtotal: 4    Volitional Movement Within Synergies  Shoulder Retraction: Full  Shoulder Elevation: Full  Shoulder Abduction (90 degrees): Full  Shoulder External Rotation: Full  Elbow Flexion: Full  Forearm Supination: Full  Shoulder Adduction/Internal Rotation: Full  Elbow Extension: Full  Forearm Pronation: Full  Subtotal: 18    Volitional Movement Mixing Synergies  Hand to Lumbar Spine: Full  Shoulder Flexion (0-90 degrees):  Full  Pronation-Supination: Full  Subtotal: 6    Volitional Movement With Little or No Synergy  Shoulder Abduction (0-90 degrees): Full  Shoulder Flexion (90-180 degrees): Full  Pronation/Supination: Full  Subtotal : 6    Normal Reflex Activity  Biceps, Triceps, Finger Flexors: Full  Subtotal : 2    Upper Extremity Total   Upper Extremity Total: 36    Wrist  Stability at 15 Degree Dorsiflexion: Full  Repeated Dorsiflexion/ Volar Flexion: Full  Stability at 15 Degree Dorsiflexion: Full  Repeated Dorsiflexion/ Volar Flexion: Full  Circumduction: Full  Wrist Total: 10    Hand  Mass Flexion: Full  Mass Extension: Full  Grasp A: Full  Grasp B: Full  Grasp C: Full  Grasp D: Full  Grasp E: Full  Hand Total: 14    Coordination/Speed  Tremor: None  Dysmetria: None  Time: <1s  Coordination/Speed Total : 6    Total A-D  Total A-D (Motor Function): 66/66    Percentage of impairment CH  0% CI  1-19% CJ  20-39% CK  40-59% CL  60-79% CM  80-99% CN  100%   Fugl-Meyer score: 0-66 66 53-65 39-52 26-38 13-25 1-12   0      This is a reliable/valid measure of arm function after a neurological  event. It has established value to characterize functional status and for measuring spontaneous and therapy-induced recovery; tests proximal and distal motor functions. Fugl-Meyer Assessment ??? UE scores recorded between five and 30 days post neurologic event can be used to predict UE recovery at six months post neurologic event.  Severe = 0-21 points   Moderately Severe = 22-33 points   Moderate = 34-47 points   Mild = 48-66 points  Reubin Milan, D., Divine, G., & Feussner, J. (1992). Measurement of motor recovery after stroke: Outcome assessment and sample size requirements. Stroke, 23, pp. [phone].   ------------------------------------------------------------------------------------------------------------------------------------------------------------------MCID:  Stroke:   Renae Gloss et al, 2001; n = 171; mean age 81 (11) years; assessed within 26 (12) days of stroke, Acute Stroke)  FMA Motor Scores from Admission to Discharge   10 point increase in FMA Upper Extremity = 1.5 change in discharge FIM   10 point increase in FMA Lower Extremity = 1.9 change in discharge FIM  MDC:   Stroke:   Loreta Ave et al, 2008, n = 14, mean age = 59.9 (14.6) years, assessed on average 14 (6.5) months post stroke, Chronic Stroke)   FMA = 5.2 points for the Upper Extremity portion of the assessment       Occupational Therapy Evaluation Charge Determination   History Examination Decision-Making   LOW Complexity : Brief history review  LOW Complexity : 1-3 performance deficits relating to physical, cognitive , or psychosocial skils that result in activity limitations and / or participation restrictions  LOW Complexity : No comorbidities that affect functional and no verbal or physical assistance needed to complete eval tasks       Based on the above components, the patient evaluation is determined to be of the following complexity level: LOW     Pain:  Pain Scale 1: Numeric (0 - 10)  Pain Intensity 1: 0               Activity Tolerance:   VSS    After treatment:   [x]   Patient left in no apparent distress sitting up in chair  []   Patient left in no apparent distress in bed  [x]   Call  bell left within reach  [x]   Nursing notified  []   Caregiver present  [x]   Chair alarm activated    COMMUNICATION/EDUCATION:   The patient???s plan of care was discussed with: Physical Therapist and Registered Nurse.    Patient was educated regarding his deficits of impaired cognitiion as this relates to his diagnosis of CVA.  He demonstrated Fair understanding as evidenced by verbalization.    Patient and/or family was verbally educated on the BE FAST acronym for signs/symptoms of CVA and TIA. Provided with BEFAST handout.  All questions answered with patient indicating fair understanding.     [x]       Home safety education was provided and the patient/caregiver indicated understanding.  [x]       Patient/family have participated as able and agree with findings and recommendations.  []       Patient is unable to participate in plan of care at this time.    This patient???s plan of care is appropriate for delegation to OTA.    Thank you for this referral.  Awilda Metro, OT  Time Calculation: 40 mins

## 2017-12-21 NOTE — Consults (Signed)
[place]    Name:[name], [name]  MR#: [id number]  DOB: 1932-05-02  ACCOUNT #: [id number]   DATE OF SERVICE: 12/21/2017    HISTORY OF PRESENT ILLNESS:  The patient is an 84 year old gentleman with a recent admission for altered mental status and CVA, who is seen for initial hematology evaluation regarding thrombocytopenia.  This patient has not seen a physician for many years, and was in his usual state of good health until the morning of 01/05, when he presented to the ER with slurred speech.  A head CT scan in the ER identified a possible infarction in the posterior left frontal lobe.  Upon admission, he was also found to be in atrial fibrillation, and Dr. [name] was consulted.  This patient was found to have thrombocytopenia upon admission, with a platelet count of 91,000, and therefore was not considered to be a candidate for tPA.    PAST MEDICAL HISTORY:  Unremarkable.    PAST SURGICAL HISTORY:  He has had a right total hip replacement.    SOCIAL HISTORY:  He is a retired Tourist information centre manager.  He drinks alcohol socially, and does not smoke.    FAMILY HISTORY:  His nephew has a history of "easy bleeding and bruising," but also takes Coumadin.    REVIEW OF SYSTEMS:  Denies fevers, night sweats, or unintentional weight loss.    PHYSICAL EXAMINATION:  GENERAL:  A pleasant, well-developed, well-nourished male, in no apparent distress.  VITAL SIGNS:  Stable.  He is afebrile.  HEENT:  EOMI, PERRLA.  Oropharynx without lesions.  NECK:  Supple.  No lymphadenopathy noted.  LUNGS:  Clear.  HEART:  Irregular rate and rhythm, I/VI systolic murmur.  ABDOMEN:  Soft, nontender, no hepatosplenomegaly noted.  EXTREMITIES:  No clubbing, cyanosis, or edema.  NEUROLOGIC:  AO x3, nonfocal.    LABORATORY DATA:  White count 5.4, hemoglobin 13.5, platelet count 80,000.  He had a normal differential on 01/05.      ASSESSMENT AND PLAN:  Thrombocytopenia in an otherwise previously healthy  84 year old gentleman.  Differential diagnosis for this includes ITP, marrow infiltrative processes, such as myelodysplasia, lymphoma, and myelofibrosis, occult splenomegaly can cause splenic sequestration and isolated cytopenias, nutritional deficiencies can cause thrombocytopenia as well.  In vitro aggregation and platelets can cause thrombocytopenia, and this can be easily ruled out by drawing blood in citrate.  I reviewed his past CBCs from 2013, and he had a borderline low platelet count at that time, measured at 150.  For the time being, we will do a serologic evaluation to rule out those things that we can.  We will perform an ultrasound of the abdomen and consider doing a bone marrow biopsy if the counts continue to drop.  The patient was not on any medications before his admission, so therefore drug-induced thrombocytopenia is not in the differential.  Finally, patient does have thrombocytopenia; however, oral anticoagulation therapy is considered to be safe in patients with atrial fibrillation, as long as the platelet count is over 50,000, and I agree with continuing Eliquis at this time.  We will follow with you on behalf of [employer].      Jaci Carrel, MD       JPE / CN  D: 12/21/2017 16:11     T: 12/21/2017 17:11  JOB #: [id number]

## 2017-12-21 NOTE — Consults (Addendum)
CARDIOLOGY CONSULT NOTE     [facility]        [address], [address], [place] [address]   [phone] fax [phone]    Name: [name]  [birth year] [id number]  12/21/2017 11:00 AM     Assessment/Plan:      1.  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  Atrial Fib - with some significant bradycardia, likely has SSS, will consult EP/Dr. Loma Newton regarding candidacy for pacemaker placement, CHADS2VASC=5 for age, vascular disease, CVA, recommend OAC with eliquis when ok with neurology and hematology  -TTE pending  -TSH 8.7, will check T3 and T4  3.  Elevated blood pressure - continue to monitor  4.  Thrombocytopenia - etiology unknown at this time, will consult hematology for evaluation       Fam hx: no early CAD, Afib, CHF, pacemakers  Soc hx: no tobacco use, one alcoholic beverage nightly     Admit Date: 12/19/2017     Admit Diagnosis: Acute CVA (cerebrovascular accident) [facility])  Primary Care Physician:None     Attending Provider: Abran Duke, MD  Primary Cardiologist: None  Consulting Cardiologist: Dr. Meredith Mody    REASON FOR CONSULT: bradycardia  Requesting Physician: Dr. Sharol Harness    Subjective:     [name] is a 84 y.o. male admitted for Acute CVA (cerebrovascular accident) (HCC).  He is an 84 year old man with a past medical history significant for arthritis who does not have a PCP or see physicians regularly.  He was in his usual state of health until the day of presentation when he developed slurred speech.  The patient has been having intermittent dizziness, gait abnormalities for months but developed slurred speech with associated right-sided facial droop.  When the patient  arrived at the emergency room, code stroke was called.  CT scan of the head was obtained and the emergency room physician consulted the neurologist through the tele neurology service.  It was determined that the patient was not a TPA candidate.  Brain MRI revealed acute frontal lobe CVA and he was admitted to the ICU.  He was found to have PVCs and Atrial Fibrillation on telemetry with periods of significant bradycardia.  He denies any hx of HTN, dyslipidemia, TIA/CVA, CAD, MI, CHF.  He does not go to physicians regularly.  He denies any chest pain, palpitations or dyspnea with exertion.  He denies any syncope.  He reports being unsteady on his feet, especially with position changes but denies any dizziness.  He denies being told that he snores or has apnea while he sleeps.        Review of Symptoms:  A comprehensive review of systems was negative except for that written in the HPI.    Previous treatment/evaluation includes none .  Cardiac risk factors: male gender, stress.    Past Medical History:   Diagnosis Date   ??? Arthritis    ??? Chronic pain     RIGHT HIP     History reviewed. No pertinent surgical history.  Current Facility-Administered Medications   Medication Dose Route Frequency   ??? influenza vaccine 2018-19 (6 mos+)(PF) (FLUARIX QUAD/FLULAVAL QUAD) injection 0.5 mL  0.5 mL IntraMUSCular PRIOR TO DISCHARGE   ??? aspirin  chewable tablet 81 mg  81 mg Oral DAILY   ??? hydrALAZINE (APRESOLINE) 20 mg/mL injection 10 mg  10 mg IntraVENous Q6H PRN   ??? acetaminophen (TYLENOL) tablet 650 mg  650 mg Oral Q4H PRN   ??? sodium chloride (NS) flush 5-10 mL  5-10 mL IntraVENous Q8H   ??? sodium chloride (NS) flush 5-10 mL  5-10 mL IntraVENous PRN   ??? acetaminophen (TYLENOL) tablet 650 mg  650 mg Oral Q4H PRN    Or   ??? acetaminophen (TYLENOL) solution 650 mg  650 mg Per NG tube Q4H PRN    Or   ??? acetaminophen (TYLENOL) suppository 650 mg  650 mg Rectal Q4H PRN   ??? ondansetron (ZOFRAN) injection 4 mg  4 mg IntraVENous Q6H PRN    ??? atorvastatin (LIPITOR) tablet 40 mg  40 mg Oral QHS   ??? bisacodyl (DULCOLAX) suppository 10 mg  10 mg Rectal DAILY PRN       No Known Allergies       Objective:      Physical Exam  Vitals:    12/21/17 0800 12/21/17 0900 12/21/17 1000 12/21/17 1100   BP: (!) 140/92 152/74 145/70 154/67   Pulse: (!) 58 74 61 66   Resp: 16 15 15 16    Temp: 98.1 ??F (36.7 ??C)      SpO2: 95% 98% 96%    Weight:       Height:           General:  Alert, cooperative, no distress, appears stated age.   Eyes:  Conjunctivae/corneas clear.     Ears:  Normal external ear canals both ears.   Nose: Nares normal.    Mouth/Throat: Moist mucous membranes.     Neck: Supple, symmetrical, trachea midline, no carotid bruit and no JVD.   Back:   Symmetric, no curvature. ROM normal.    Lungs:   Clear to auscultation bilaterally.   Heart:  Irregular rate and rhythm, S1, S2 normal, no murmur, click, rub or gallop.   Abdomen:   Soft, non-tender. Bowel sounds normal.    Extremities: Extremities normal, atraumatic, no cyanosis or edema.   Vascular: 2+ and symmetric all extremities.   Skin: Skin color normal. No rashes or lesions   Lymph nodes: Not assessed   Neurologic: CNII-XII intact. Normal strength throughout.       Telemetry: AFIB with periods of bradycardia down to 30bpm, frequent PVCs  ECG: atrial fibrillation, rate 91bpm, PVCs, non-specific ST-T wave changes     Data Review:     Recent Labs     12/20/17  0540 12/19/17  2139   CPK 62 53   TROIQ <0.05 <0.05     Recent Labs     12/21/17  0548 12/20/17  0540   NA 144 142   K 4.0 4.0   CL 113* 111*   CO2 25 24   BUN 11 10   CREA 1.12 1.00   GLU 84 83   PHOS  --  2.4*   CA 8.3* 8.6     Recent Labs     12/21/17  0548 12/19/17  1804   WBC 5.4 5.8   HGB 13.5 14.5   HCT 41.1 44.5   PLT 80* 91*     Recent Labs     12/20/17  0540 12/19/17  1804   SGOT 39* 38*   AP 54 61     Recent Labs     12/20/17  0540  CHOL 145   LDLC 87.2     Recent Labs     12/20/17  0540 12/19/17  2139   CRP  --  <0.29   TSH 8.79*  --       Thank you very much for this referral. I appreciate the opportunity to participate in this patient's care. I will follow along with above stated plan.    Marcellus Scotthristine M Madgeline Rayo, MD  [facility]  [address], [address]  [place], [place] [id number]  [phone]    FA:OZHYCC:None

## 2017-12-21 NOTE — Progress Notes (Signed)
Problem: Pressure Injury - Risk of  Goal: *Prevention of pressure injury  Document Braden Scale and appropriate interventions in the flowsheet.  Outcome: Progressing Towards Goal  Pressure Injury Interventions:       Moisture Interventions: Absorbent underpads, Check for incontinence Q2 hours and as needed    Activity Interventions: Pressure redistribution bed/mattress(bed type)    Mobility Interventions: Float heels, Pressure redistribution bed/mattress (bed type)    Nutrition Interventions: Document food/fluid/supplement intake    Friction and Shear Interventions: HOB 30 degrees or less

## 2017-12-21 NOTE — Progress Notes (Signed)
Speech Pathology bedside swallow evaluation/discharge  Patient: [name] ([age] y.o. male)  Date: 12/21/2017  Primary Diagnosis: Acute CVA (cerebrovascular accident) [facility](HCC)       Precautions:   Fall    ASSESSMENT :  Based on the objective data described below, the patient presents with no oral or pharyngeal dysphagia with timely and complete mastication, timely swallow initiation, and functional hyolaryngeal elevation/excursion via palpation.  No s/s of aspiration observed.  Noted difficulty with self feeding with patient trying to drink from the handle of a spoon like a straw.  Confusion and some word finding difficulties noted.  Will follow for formal evaluation.  Skilled acute therapy provided by a speech-language pathologist is not indicated at this time for swallowing.     PLAN :  Recommendations:  --regular diet.  Will follow for formal language evaluation   Discharge Recommendations: To Be Determined     SUBJECTIVE:   Patient stated ???oh, is that what I'm supposed to do with it????. When shown how to correctly use his spoon    OBJECTIVE:     Past Medical History:   Diagnosis Date   ??? Arthritis    ??? Chronic pain     RIGHT HIP   History reviewed. No pertinent surgical history.  Prior Level of Function/Home Situation:   Home Situation  Home Environment: Private residence  # Steps to Enter: 3  Rails to Enter: Yes  Secondary school teacherHand Rails : Bilateral  One/Two Story Residence: Two story  Living Alone: Yes  Support Systems: Family member(s), Friends \\ neighbors  Patient Expects to be Discharged to:: Rehabilitation facility  Current DME Used/Available at Home: None  Diet prior to admission: regular  Current Diet:  Regular  Cognitive and Communication Status:  Neurologic State: Alert, Confused  Orientation Level: Oriented to person, Oriented to place, Oriented to situation, Other (Comment)(says 05/22/18 for date)  Cognition: Follows commands, Decreased attention/concentration, Memory loss, Impulsive  Perception: Appears intact   Perseveration: No perseveration noted  Safety/Judgement: Not assessed  Oral Assessment:  Oral Assessment  Labial: No impairment  Dentition: Natural  Oral Hygiene: moist mucosa   Lingual: No impairment  Velum: No impairment  Mandible: No impairment  P.O. Trials:  Patient Position: upright in chair   Vocal quality prior to P.O.: No impairment  Consistency Presented: Thin liquid;Solid;Puree  How Presented: Self-fed/presented;SLP-fed/presented;Spoon;Straw;Successive swallows     Bolus Acceptance: Other (comment)(decreased recognition of spoon for self feeding)  Bolus Formation/Control: No impairment     Propulsion: No impairment  Oral Residue: None  Initiation of Swallow: No impairment  Laryngeal Elevation: Functional  Aspiration Signs/Symptoms: None  Pharyngeal Phase Characteristics: No impairment, issues, or problems         Comments: patient with decreased recognition of spoon - trying to drink from it like a straw   Oral Phase Severity: No impairment  Pharyngeal Phase Severity : No impairment  NOMS:   The NOMS functional outcome measure was used to quantify this patient's level of swallowing impairment.  Based on the NOMS, the patient was determined to be at level 7 for swallow function     NOMS Swallowing Levels:  Level 1 (CN): NPO  Level 2 (CM): NPO but takes consistency in therapy  Level 3 (CL): Takes less than 50% of nutrition p.o. and continues with nonoral feedings; and/or safe with mod cues; and/or max diet restriction  Level 4 (CK): Safe swallow but needs mod cues; and/or mod diet restriction; and/or still requires some nonoral feeding/supplements  Level 5 (  CJ): Safe swallow with min diet restriction; and/or needs min cues  Level 6 (CI): Independent with p.o.; rare cues; usually self cues; may need to avoid some foods or needs extra time  Level 7 (CH): Independent for all p.o.  ASHA. (2003). National Outcomes Measurement System (NOMS): Adult Speech-Language Pathology User's Guide.       Pain:   Pain Scale 1: Numeric (0 - 10)  Pain Intensity 1: 0     After treatment:   []  Patient left in no apparent distress sitting up in chair  [x]  Patient left in no apparent distress in bed  [x]  Call bell left within reach  [x]  Nursing notified  [x]  Caregiver present  []  Bed alarm activated    COMMUNICATION/EDUCATION:   The patient???s plan of care including findings, recommendations, and recommended diet changes were discussed with: Registered Nurse.  Patient was educated regarding His functional swallow as this relates to His diagnosis of CVA.  He demonstrated Fair understanding as evidenced by verbalization of understanding.    [x]  Patient/family have participated as able and agree with findings and recommendations.  []  Patient is unable to participate in plan of care at this time.    Thank you for this referral.  Benn Moulder, M.CD. CCC-SLP   Time Calculation: 10 mins

## 2017-12-21 NOTE — Progress Notes (Signed)
Hospitalist Progress Note  NAME: [name]   DOB:  1932/10/17   MRN:  [id number]       Assessment / Plan:    .    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 Seen in consultation with neurology. 2 D Echo. is pending. Cont. Aspirin, eliquis and lipitor. Consult PT/OT, CM regarding Dc planning.     .   Elevated blood pressure.  The patient has no history of hypertension.  We will monitor the patient's blood pressure closely. If the average blood pressure readings remain elevated, we will initiate antihypertensive medication for new onset essential hypertension. TSH level is high, will check T3, T4.    .   Atrial Fib - with some significant bradycardia, likely has sick sinus syndrome, consulted EP/Dr. Loma Newton regarding candidacy for pacemaker placement, CHADS2VASC=5 for age, vascular disease, CVA, recommended to start eliquis. TTE pending. TSH 8.7, will check T3 and T4    .   Thrombocytopenia.  The etiology is not clear at this time.  As patient is asymptomatic, will monitor the patient's platelet count.    .    Osteoarthritis. Stable.      Body mass index is 23.86 kg/m??.    Code status: Full  Prophylaxis: Eliquis  Recommended Disposition: Home w/Family    Subjective:     Chief Complaint / Reason for Physician Visit  "Stroke".  Discussed with RN events overnight.     Objective:     VITALS:   Last 24hrs VS reviewed since prior progress note. Most recent are:  Patient Vitals for the past 24 hrs:   Temp Pulse Resp BP SpO2   12/21/17 0900 ??? 74 15 152/74 98 %   12/21/17 0800 98.1 ??F (36.7 ??C) (!) 58 16 (!) 140/92 95 %   12/21/17 0600 ??? 63 17 133/54 99 %   12/21/17 0500 ??? (!) 48 18 108/45 (!) 71 %   12/21/17 0400 97.6 ??F (36.4 ??C) (!) 55 17 138/56 95 %   12/21/17 0300 ??? (!) 51 14 118/52 ???   12/21/17 0100 ??? (!) 48 13 (!) 114/97 94 %    12/21/17 0000 97.5 ??F (36.4 ??C) (!) 42 16 ??? 95 %   12/20/17 2300 ??? (!) 48 16 123/68 94 %   12/20/17 2200 ??? (!) 51 ??? 130/52 94 %   12/20/17 2100 ??? (!) 50 ??? 116/52 92 %   12/20/17 2000 97.7 ??F (36.5 ??C) (!) 49 15 105/52 96 %   12/20/17 1900 ??? (!) 56 20 109/53 95 %   12/20/17 1800 ??? 71 16 122/57 94 %   12/20/17 1700 ??? (!) 53 15 125/67 98 %   12/20/17 1600 97.8 ??F (36.6 ??C) (!) 54 22 110/58 97 %   12/20/17 1500 ??? 66 24 103/50 91 %   12/20/17 1400 ??? (!) 47 18 108/53 98 %   12/20/17 1345 ??? (!) 56 14 116/58 96 %   12/20/17 1253 ??? 76 17 136/68 94 %   12/20/17 1246 ??? 78 19 143/78 93 %   12/20/17 1200 98.1 ??F (36.7 ??C) 69 17 136/58 92 %   12/20/17 1100 ??? 72 14 (!) 161/111 95 %   12/20/17 1030 ??? 74 23 119/81 95 %   12/20/17 1000 ??? 67 16 122/46 92 %   12/20/17 0930 ???  73 17 138/61 94 %       Intake/Output Summary (Last 24 hours) at 12/21/2017 0922  Last data filed at 12/21/2017 0600  Gross per 24 hour   Intake 1363.38 ml   Output 800 ml   Net 563.38 ml        PHYSICAL EXAM:  General: WD, WN. Alert, cooperative, no acute distress????  EENT:  EOMI. Anicteric sclerae. MMM  Resp:  CTA bilaterally, no wheezing or rales.  No accessory muscle use  CV:  IRIR,?? No edema  GI:  Soft, Non distended, Non tender. ??+Bowel sounds  Neurologic:?? Alert and oriented X 3, normal speech,   Psych:???? Good insight.??Not anxious nor agitated  Skin:  No rashes.  No jaundice    Reviewed most current lab test results and cultures  YES  Reviewed most current radiology test results   YES  Review and summation of old records today    NO  Reviewed patient's current orders and MAR    YES  PMH/SH reviewed - no change compared to H&P  ________________________________________________________________________  Care Plan discussed with:    Comments   Patient y    Family      RN y    Buyer, retailCare Manager     Consultant                            ________________________________________________________________________      Comments    >50% of visit spent in counseling and coordination of care y    ________________________________________________________________________  Abran DukeHammad Jinny Sweetland, MD     Procedures: see electronic medical records for all procedures/Xrays and details which were not copied into this note but were reviewed prior to creation of Plan.      LABS:  I reviewed today's most current labs and imaging studies.  Pertinent labs include:  Recent Labs     12/21/17  0548 12/19/17  1804   WBC 5.4 5.8   HGB 13.5 14.5   HCT 41.1 44.5   PLT 80* 91*     Recent Labs     12/21/17  0548 12/20/17  0540 12/19/17  1804   NA 144 142 137   K 4.0 4.0 3.9   CL 113* 111* 103   CO2 25 24 26    GLU 84 83 107*   BUN 11 10 12    CREA 1.12 1.00 1.04   CA 8.3* 8.6 8.8   MG  --  2.0  --    PHOS  --  2.4*  --    ALB  --  3.2* 3.5   TBILI  --  1.3* 1.0   SGOT  --  39* 38*   ALT  --  41 42       Signed: Abran DukeHammad Akul Leggette, MD

## 2017-12-21 NOTE — Other (Signed)
Pt arrived on unit at 1932, vs stable, with visitors.  Pt reports 0/10 pain and shows no signs of distress.  He is a & O x 4.      Bedside shift change report given to LesothoJenessa (Cabin crewoncoming nurse) by Kennyth ArnoldStacy (offgoing nurse). Report included the following information SBAR, Kardex, Intake/Output, MAR, Accordion and Cardiac Rhythm A fib with PVCs.

## 2017-12-21 NOTE — Progress Notes (Addendum)
Reason for Admission:  [facility] for work [facility] for right sided facial drrop, unsteady gait and dizziness.                    RRAT Score: 10                    Plan for utilizing home health:     TBD                     Likelihood of Readmission:  Low                         Transition of Care Plan:   TBD    Care manager met with patient and his nephew Yohann Curl . Per patient he lives alone and is independent to include driving. His home is on [place]. Patient uses [facility] as his PCP , but would like to be set up with a [facility] physician prior to discharge. He does not take any prescription medications. Patient has no previous equipment needs, used [facility] for  home health 5 years ago after a hip replacement. Patient's AMD is scanned into the EMR listing Marcheta Grammes and Ova Meegan [phone]) as his MPOA's. Discussed with patient options for rehab at discharge prior to returning home, will await recommendation from therapy to assist with discharge planning. Per nephew , his parents (patient's brotherand his wife)  will be able to stay short term with patient should he need it and he will be able to check on him daily but is unable to spend the night. CM confirmed patient's insurance and address to be correct on the demographic sheet.   [name] Fall RN,CRM          Care Management Interventions  PCP Verified by CM: Yes([facility])  Palliative Care Criteria Met (RRAT>21 & CHF Dx)?: No  MyChart Signup: No  Discharge Durable Medical Equipment: No  Physical Therapy Consult: Yes  Occupational Therapy Consult: Yes  Speech Therapy Consult: Yes  Current Support Network: Own Home, Lives Alone(MPOA Marcheta Grammes and Maryfrances Bunnell [phone]) )  Confirm Follow Up Transport: Family

## 2017-12-21 NOTE — Progress Notes (Signed)
Problem: Mobility Impaired (Adult and Pediatric)  Goal: *Acute Goals and Plan of Care (Insert Text)  Physical Therapy Goals  Initiated 12/21/2017  1.  Patient will move from supine to sit and sit to supine  in bed with modified independence within 7 day(s).    2.  Patient will transfer from bed to chair and chair to bed with modified independence using the least restrictive device within 7 day(s).  3.  Patient will perform sit to stand with modified independence within 7 day(s).  4.  Patient will ambulate with modified independence for 200 feet with the least restrictive device within 7 day(s).   5.  Patient will ascend/descend 12 stairs with single handrail(s) with minimal assistance/contact guard assist within 7 day(s).  6.  Patient will improve Berg Balance score by 7 points within 7 days.     physical Therapy EVALUATION- neuro population    Patient: [name] (84 y.o. male)  Date: 12/21/2017  Primary Diagnosis: Acute CVA (cerebrovascular accident) [facility])       Precautions:   Fall    ASSESSMENT :  Based on the objective data described below, the patient presents with decreased functional mobility, impaired balance and gait, decreased safety awareness s/p admission for L MCA TIA vs small territory infarct. Pt received supine in bed with sitter present in room. Pt alert and oriented x 4, but does have episodes of confusion and repeated same question after 1 minute. Pt reported prior to admissio nhe was ambulatory without AD, lives alone in a 2 story home, driving, performing ADL's independently. Pt tolerated evaluation fairly well. Pt demonstrated good and equal strength in bilateral LEs. Noted good strength in LUE and RUE except 4/5 tricep strength. Normal sensation and coordination throughout. Pt completed bed mobility with standby assist, sit<>stand with min A and generalized unsteadiness upon initial standing. Pt ambulated without AD with min A  demonstrating narrow BOS, path deviations and fluctuating gait speed. Pt scored 33/56 on the Berg Balance score indicating moderate risk for falls. Pt educated on BEFAST acronym for signs and symptoms of stroke. Pt with fair understanding. Pt returned to seated in chair with bed alarm set and sitter in place. Pt will continue to benefit from PT to progress mobility as tolerated, improve balance and gait. Pt is functioning below independent baseline status and will benefit from inpatient rehab upon discharge to reach highest level of independence.     Patient will benefit from skilled intervention to address the above impairments.  Patient???s rehabilitation potential is considered to be Good  Factors which may influence rehabilitation potential include:   []            None noted  []            Mental ability/status  [x]            Medical condition  []            Home/family situation and support systems  [x]            Safety awareness  []            Pain tolerance/management  []            Other:      PLAN :  Recommendations and Planned Interventions:  [x]              Bed Mobility Training             [x]       Neuromuscular Re-Education  [x]   Transfer Training                   []       Orthotic/Prosthetic Training  [x]              Gait Training                         []       Modalities  [x]              Therapeutic Exercises           []       Edema Management/Control  [x]              Therapeutic Activities            [x]       Patient and Family Training/Education  []              Other (comment):  Frequency/Duration: Patient will be followed by physical therapy 5 times a week to address goals.  Discharge Recommendations: Inpatient Rehab  Further Equipment Recommendations for Discharge: tbd     SUBJECTIVE:   Patient stated ???What time did you say the doctor would be coming by?Marland Kitchen??? Pt asked this question twice in 1 minute without remembering he asked previously    OBJECTIVE DATA SUMMARY:   HISTORY:     Past Medical History:   Diagnosis Date   ??? Arthritis    ??? Chronic pain     RIGHT HIP   History reviewed. No pertinent surgical history.  Prior Level of Function/Home Situation: see above  Personal factors and/or comorbidities impacting plan of care:     Home Situation  Home Environment: Private residence  # Steps to Enter: 3  Rails to Enter: Yes  Hand Rails : Bilateral  One/Two Story Residence: Two story  Living Alone: Yes  Support Systems: Family member(s), Friends \\ neighbors  Patient Expects to be Discharged to:: Rehabilitation facility  Current DME Used/Available at Home: None  EXAMINATION/PRESENTATION/DECISION MAKING:   Critical Behavior:  Neurologic State: Alert, Eyes open spontaneously  Orientation Level: Oriented X4  Cognition: Follows commands, Impulsive, Memory loss     Hearing:  Auditory  Auditory Impairment: NoneSkin:    Edema:   Range Of Motion:  AROM: Within functional limits                       Strength:    Strength: Within functional limits(5/5 throughout except 4/5 R triceps)                    Tone & Sensation:   Tone: Normal              Sensation: Intact               Coordination:  Coordination: Within functional limits  Vision:      Functional Mobility:  Bed Mobility:     Supine to Sit: Stand-by assistance        Transfers:  Sit to Stand: Minimum assistance  Stand to Sit: Minimum assistance                       Balance:   Sitting: Intact  Standing: Impaired  Standing - Static: Fair  Standing - Dynamic : FairAmbulation/Gait Training:Distance (ft): 80 Feet (ft)  Assistive Device: Gait belt  Ambulation - Level of Assistance: Minimal assistance        Gait  Abnormalities: Decreased step clearance;Path deviations;Trunk sway increased        Base of Support: Narrowed     Speed/Cadence: Pace decreased (<100 feet/min)  Step Length: Right shortened;Left shortened                     Stair Training:                Functional Measure  Berg Balance Test:    Sitting to Standing: 1   Standing Unsupported: 3  Sitting with Back Unsupported: 4  Standing to Sitting: 2  Transfers: 1  Standing Unsupported with Eyes Closed: 4  Standing Unsupported with Feet Together: 3  Reach Forward with Outstretched Arm: 3  Pick Up Object: 3  Turn to Look Over Shoulders: 4  Turn 360 Degrees: 2  Alternate Foot on Step/Stool: 3  Standing Unsupported One Foot in Front: 0  Stand on One Leg: 0  Total: 33        56=Maximum possible score;   0-20=High fall risk  21-40=Moderate fall risk   41-56=Low fall risk     Berg Balance Test and G-code impairment scale:  Percentage of Impairment CH    0%   CI    1-19% CJ    20-39% CK    40-59% CL    60-79% CM    80-99% CN     100%   Berg   Score 0-56 56 45-55 34-44 23-33 12-22 1-11 0      Based on the above components, the patient evaluation is determined to be of the following complexity level: LOW     Therapeutic Exercises:     Pain:  Pain Scale 1: Numeric (0 - 10)  Pain Intensity 1: 0              Activity Tolerance:   Good. VSS. Pre activity: 154/69. Post activity: 159/68    Please refer to the flowsheet for vital signs taken during this treatment.  After treatment:   [x]      Patient left in no apparent distress sitting up in chair  []      Patient left in no apparent distress in bed  [x]      Call bell left within reach  [x]      Nursing notified  []      Caregiver present  [x]      Bed alarm activated    COMMUNICATION/EDUCATION:   The patient???s plan of care was discussed with: Occupational Therapist and Registered Nurse.    Patient was educated regarding his deficit(s) of impaired speech, R sided weakness as this relates to his diagnosis of L MCA CVA.  He demonstrated Fair understanding   Patient and/or family was verbally educated on the BE FAST acronym for signs/symptoms of CVA and TIA. BE FAST was written on patient's communication board  for visual education and reinforcement.  All questions answered with patient indicating fair understanding.      [x]   Fall prevention education was provided and the patient/caregiver indicated understanding.  [x]   Patient/family have participated as able in goal setting and plan of care.  [x]   Patient/family agree to work toward stated goals and plan of care.  []   Patient understands intent and goals of therapy, but is neutral about his/her participation.  []   Patient is unable to participate in goal setting and plan of care.    Thank you for this referral.  Hamilton CapriAshley Paynter, PT , DPT   Time Calculation: 21 mins

## 2017-12-21 NOTE — Progress Notes (Signed)
Problem: Falls - Risk of  Goal: *Absence of Falls  Document Schmid Fall Risk and appropriate interventions in the flowsheet.  Outcome: Progressing Towards Goal  Fall Risk Interventions:  Mobility Interventions: Bed/chair exit alarm, Patient to call before getting OOB         Medication Interventions: Bed/chair exit alarm, Patient to call before getting OOB    Elimination Interventions: Bed/chair exit alarm, Patient to call for help with toileting needs

## 2017-12-21 NOTE — Progress Notes (Signed)
Problem: Falls - Risk of  Goal: *Absence of Falls  Document Schmid Fall Risk and appropriate interventions in the flowsheet.  Fall Risk Interventions:  Mobility Interventions: Bed/chair exit alarm, Communicate number of staff needed for ambulation/transfer, OT consult for ADLs, PT Consult for mobility concerns         Medication Interventions: Assess postural VS orthostatic hypotension, Bed/chair exit alarm, Evaluate medications/consider consulting pharmacy, Patient to call before getting OOB, Teach patient to arise slowly    Elimination Interventions: Bed/chair exit alarm, Call light in reach, Patient to call for help with toileting needs, Toilet paper/wipes in reach, Toileting schedule/hourly rounds, Urinal in reach             Problem: Pressure Injury - Risk of  Goal: *Prevention of pressure injury  Document Braden Scale and appropriate interventions in the flowsheet.  Outcome: Progressing Towards Goal  Pressure Injury Interventions:       Moisture Interventions: Absorbent underpads, Check for incontinence Q2 hours and as needed, Maintain skin hydration (lotion/cream), Minimize layers, Moisture barrier, Offer toileting Q_hr    Activity Interventions: Increase time out of bed    Mobility Interventions: Float heels, HOB 30 degrees or less, Pressure redistribution bed/mattress (bed type), PT/OT evaluation, Turn and reposition approx. every two hours(pillow and wedges)    Nutrition Interventions: Document food/fluid/supplement intake    Friction and Shear Interventions: HOB 30 degrees or less, Lift sheet, Minimize layers

## 2017-12-21 NOTE — Progress Notes (Signed)
0730 Shift Summary-- Pt remains alert and oriented with good facial symmetry and clear speech-- More impulsive tonight and forgets to ring callbell , despite the fact that its positioned by his hand--He has a bed alarm that alerts nursing staff that he has gotten up, coming between the bottom side rails, pulling his SCD hoses tight and pulling off his gown--unsteady on his feet--encouraged numerous times to stay in bed and ring call bell for ANYTHING--Finally, out of necessity, sitter placed in room for pt safety--VSS--afebrile--HR goes down as low as 30 (atrial fib?) continues to have freq pvcs--despite normal lytes--

## 2017-12-21 NOTE — Progress Notes (Signed)
Neurology Progress Note    Patient ID:  RIHAN SCHUELER  [id number]  84 y.o.  09/10/1932    Subjective:      Patient without new complaints or acute events overnight.    He reports speech is at baseline.    MRI Brain completed showing small foci of restricted diffusion L M2 division corresponding with territory of previously noted distal occlusion.      Current Facility-Administered Medications   Medication Dose Route Frequency   ??? influenza vaccine 2018-19 (6 mos+)(PF) (FLUARIX QUAD/FLULAVAL QUAD) injection 0.5 mL  0.5 mL IntraMUSCular PRIOR TO DISCHARGE   ??? aspirin chewable tablet 81 mg  81 mg Oral DAILY   ??? hydrALAZINE (APRESOLINE) 20 mg/mL injection 10 mg  10 mg IntraVENous Q6H PRN   ??? acetaminophen (TYLENOL) tablet 650 mg  650 mg Oral Q4H PRN   ??? sodium chloride (NS) flush 5-10 mL  5-10 mL IntraVENous Q8H   ??? sodium chloride (NS) flush 5-10 mL  5-10 mL IntraVENous PRN   ??? acetaminophen (TYLENOL) tablet 650 mg  650 mg Oral Q4H PRN    Or   ??? acetaminophen (TYLENOL) solution 650 mg  650 mg Per NG tube Q4H PRN    Or   ??? acetaminophen (TYLENOL) suppository 650 mg  650 mg Rectal Q4H PRN   ??? ondansetron (ZOFRAN) injection 4 mg  4 mg IntraVENous Q6H PRN   ??? atorvastatin (LIPITOR) tablet 40 mg  40 mg Oral QHS   ??? bisacodyl (DULCOLAX) suppository 10 mg  10 mg Rectal DAILY PRN          Objective:     Patient Vitals for the past 8 hrs:   BP Temp Pulse Resp SpO2   12/21/17 1100 154/67 ??? 66 16 ???   12/21/17 1000 145/70 ??? 61 15 96 %   12/21/17 0900 152/74 ??? 74 15 98 %   12/21/17 0800 (!) 140/92 98.1 ??F (36.7 ??C) (!) 58 16 95 %   12/21/17 0700 127/54 ??? (!) 54 15 95 %   12/21/17 0600 133/54 ??? 63 17 99 %   12/21/17 0500 108/45 ??? (!) 48 18 (!) 71 %       01/07 0701 - 01/07 1900  In: 200 [P.O.:200]  Out: 175 [Urine:175]  01/05 1901 - 01/07 0700  In: 4233.8 [P.O.:900; I.V.:3333.8]  Out: 1550 [Urine:1550]    Lab Review   Recent Results (from the past 24 hour(s))   CBC W/O DIFF    Collection Time: 12/21/17  5:48 AM    Result Value Ref Range    WBC 5.4 4.1 - 11.1 K/uL    RBC 4.03 (L) 4.10 - 5.70 M/uL    HGB 13.5 12.1 - 17.0 g/dL    HCT 40.9 81.1 - 91.4 %    MCV 102.0 (H) 80.0 - 99.0 FL    MCH 33.5 26.0 - 34.0 PG    MCHC 32.8 30.0 - 36.5 g/dL    RDW 78.2 95.6 - 21.3 %    PLATELET 80 (L) 150 - 400 K/uL    MPV 11.3 8.9 - 12.9 FL    NRBC 0.0 0 PER 100 WBC    ABSOLUTE NRBC 0.00 0.00 - 0.01 K/uL   METABOLIC PANEL, BASIC    Collection Time: 12/21/17  5:48 AM   Result Value Ref Range    Sodium 144 136 - 145 mmol/L    Potassium 4.0 3.5 - 5.1 mmol/L    Chloride 113 (H) 97 - 108  mmol/L    CO2 25 21 - 32 mmol/L    Anion gap 6 5 - 15 mmol/L    Glucose 84 65 - 100 mg/dL    BUN 11 6 - 20 MG/DL    Creatinine 2.021.12 5.420.70 - 1.30 MG/DL    BUN/Creatinine ratio 10 (L) 12 - 20      GFR est AA >60 >60 ml/min/1.4973m2    GFR est non-AA >60 >60 ml/min/1.4073m2    Calcium 8.3 (L) 8.5 - 10.1 MG/DL     NEUROLOGICAL EXAM:        Mental Status: Oriented to time, place and person. No aphasia, dysarthria.     Cranial Nerves:   Intact visual fields. PERLA, EOM's full, no nystagmus, no ptosis. Facial sensation is normal.  Facial movement is symmetric. Hearing is normal bilaterally. Palate is midline with normal sternocleidomastoid and trapezius muscles are normal. Tongue is midline.   Motor:  5/5 strength in upper and lower proximal and distal muscles. Normal bulk and tone.    Reflexes:   Deep tendon reflexes 1+/4 and symmetrical.   Sensory:   Normal to touch throughout   Gait:  Unsteady   Tremor:   No tremor noted.   Cerebellar:  No cerebellar signs present.       Assessment:     Principal Problem:    Acute CVA (cerebrovascular accident) (HCC) (12/19/2017)    84 year old RHM with a h/o gait instability x 6 months, more recent recurrent episodes of slurred speech/aphasia lasting several minutes each over the past several months without associated focal weakness or numbness presenting with acute severe dysarthria noted by his neighbors 12/19/17.   There was also mention of a possible right facial droop.  Symptoms recurred upon ED evaluation but are presently resolved.  CT head did not reveal any acute intracranial process.  CTA/CTP reviewed occlusion of the distal L M2/3 segment not amenable to endovascular intervention, decreased perfusion of the L frontal region.  MRI Brain completed showing small foci of restricted diffusion L M2 division corresponding with territory of previously noted distal occlusion.    He is presently at his baseline with non focal examination apart from pre-existing gait instability.    A/P L MCA small territory infarct, L M2/3 occlusion not amenable to endovascular intervention.  Mechanism likely cardioembolic in setting of newly diagnosed Afib.    Plan:   Ok to initiate Eliquis as stroke territory is Engineer, structuralsmall  Cont. Statin therapy  Goal SBP<140, LDL<70  Stroke education  Dispo planning  Transfer to [facility]  Will sign off- please arrange for neurology f/u 4 weeks      Signed:  Hulan SaasRachel M Dayonna Selbe, DO  12/21/2017  12:20 PM

## 2017-12-22 ENCOUNTER — Inpatient Hospital Stay: Payer: MEDICARE | Primary: Family

## 2017-12-22 ENCOUNTER — Inpatient Hospital Stay: Admit: 2017-12-22 | Payer: MEDICARE | Primary: Family

## 2017-12-22 LAB — CBC W/O DIFF
ABSOLUTE NRBC: 0 10*3/uL (ref 0.00–0.01)
HCT: 43.1 % (ref 36.6–50.3)
HGB: 15.1 g/dL (ref 12.1–17.0)
MCH: 34.2 PG — ABNORMAL HIGH (ref 26.0–34.0)
MCHC: 35 g/dL (ref 30.0–36.5)
MCV: 97.5 FL (ref 80.0–99.0)
MPV: 11.3 FL (ref 8.9–12.9)
NRBC: 0 PER 100 WBC
PLATELET: 88 10*3/uL — ABNORMAL LOW (ref 150–400)
RBC: 4.42 M/uL (ref 4.10–5.70)
RDW: 13.6 % (ref 11.5–14.5)
WBC: 7.3 10*3/uL (ref 4.1–11.1)

## 2017-12-22 LAB — METABOLIC PANEL, BASIC
Anion gap: 7 mmol/L (ref 5–15)
BUN/Creatinine ratio: 12 (ref 12–20)
BUN: 12 MG/DL (ref 6–20)
CO2: 22 mmol/L (ref 21–32)
Calcium: 8.9 MG/DL (ref 8.5–10.1)
Chloride: 113 mmol/L — ABNORMAL HIGH (ref 97–108)
Creatinine: 0.97 MG/DL (ref 0.70–1.30)
GFR est AA: >60 mL/min/{1.73_m2} (ref >60)
GFR est non-AA: >60 mL/min/{1.73_m2} (ref >60)
Glucose: 94 mg/dL (ref 65–100)
Potassium: 3.8 mmol/L (ref 3.5–5.1)
Sodium: 142 mmol/L (ref 136–145)

## 2017-12-22 LAB — FOLATE: Folate: 11.2 ng/mL (ref 5.0–21.0)

## 2017-12-22 LAB — VITAMIN B12: Vitamin B12: 438 pg/mL (ref 193–986)

## 2017-12-22 LAB — MAGNESIUM: Magnesium: 1.9 mg/dL (ref 1.6–2.4)

## 2017-12-22 MED ORDER — SODIUM CHLORIDE 0.9 % IRRIGATION SOLN
0.9 % | Freq: Once | Status: AC
Start: 2017-12-22 — End: 2017-12-22
  Administered 2017-12-22: 13:00:00

## 2017-12-22 MED ORDER — VANCOMYCIN 1,000 MG IV SOLR
1000 mg | Freq: Once | INTRAVENOUS | Status: AC
Start: 2017-12-22 — End: 2017-12-22

## 2017-12-22 MED ORDER — MIDAZOLAM 1 MG/ML IJ SOLN
1 mg/mL | INTRAMUSCULAR | Status: DC | PRN
Start: 2017-12-22 — End: 2017-12-22
  Administered 2017-12-22 (×2): via INTRAVENOUS

## 2017-12-22 MED ORDER — CEFAZOLIN 2 GRAM/20 ML IN STERILE WATER INTRAVENOUS SYRINGE
2 gram/0 mL | INTRAVENOUS | Status: AC
Start: 2017-12-22 — End: 2017-12-22
  Administered 2017-12-22: 12:00:00 via INTRAVENOUS

## 2017-12-22 MED ORDER — SODIUM CHLORIDE 0.9 % IJ SYRG
INTRAMUSCULAR | Status: DC | PRN
Start: 2017-12-22 — End: 2017-12-22
  Administered 2017-12-22: 13:00:00 via INTRAVENOUS

## 2017-12-22 MED ORDER — SODIUM CHLORIDE 0.9 % IJ SYRG
INTRAMUSCULAR | Status: DC | PRN
Start: 2017-12-22 — End: 2017-12-24

## 2017-12-22 MED ORDER — SODIUM CHLORIDE 0.9 % IJ SYRG
Freq: Three times a day (TID) | INTRAMUSCULAR | Status: DC
Start: 2017-12-22 — End: 2017-12-24
  Administered 2017-12-22 – 2017-12-24 (×7): via INTRAVENOUS

## 2017-12-22 MED ORDER — FENTANYL CITRATE (PF) 50 MCG/ML IJ SOLN
50 mcg/mL | INTRAMUSCULAR | Status: DC | PRN
Start: 2017-12-22 — End: 2017-12-22
  Administered 2017-12-22 (×2): via INTRAVENOUS

## 2017-12-22 MED ORDER — IOPAMIDOL 76 % IV SOLN
370 mg iodine /mL (76 %) | INTRAVENOUS | Status: DC | PRN
Start: 2017-12-22 — End: 2017-12-22
  Administered 2017-12-22: 12:00:00 via INTRAVENOUS

## 2017-12-22 MED ORDER — CEFAZOLIN 2 GRAM/20 ML IN STERILE WATER INTRAVENOUS SYRINGE
2 gram/0 mL | Freq: Three times a day (TID) | INTRAVENOUS | Status: AC
Start: 2017-12-22 — End: 2017-12-22
  Administered 2017-12-22 – 2017-12-23 (×2): via INTRAVENOUS

## 2017-12-22 MED ORDER — LIDOCAINE HCL 1 % (10 MG/ML) IJ SOLN
10 mg/mL (1 %) | INTRAMUSCULAR | Status: DC | PRN
Start: 2017-12-22 — End: 2017-12-22
  Administered 2017-12-22: 12:00:00 via SUBCUTANEOUS

## 2017-12-22 MED ORDER — SODIUM CHLORIDE 0.9 % IV PIGGY BACK
1 gram | Freq: Three times a day (TID) | INTRAVENOUS | Status: DC
Start: 2017-12-22 — End: 2017-12-22

## 2017-12-22 MED ORDER — NALOXONE 1 MG/ML IJ SYRG(AKA NARCAN)
1 mg/mL | INTRAMUSCULAR | Status: DC | PRN
Start: 2017-12-22 — End: 2017-12-24

## 2017-12-22 MED ORDER — ATROPINE 0.1 MG/ML SYRINGE
0.1 mg/mL | INTRAMUSCULAR | Status: DC | PRN
Start: 2017-12-22 — End: 2017-12-22

## 2017-12-22 MED FILL — BACITRACIN 50,000 UNIT IM: 50000 unit | INTRAMUSCULAR | Qty: 50000

## 2017-12-22 MED FILL — NALOXONE 1 MG/ML IJ SYRG(AKA NARCAN): 1 mg/mL | INTRAMUSCULAR | Qty: 0.4

## 2017-12-22 MED FILL — VANCOMYCIN 1,000 MG IV SOLR: 1000 mg | INTRAVENOUS | Qty: 1000

## 2017-12-22 MED FILL — CEFAZOLIN 2 GRAM/20 ML IN STERILE WATER INTRAVENOUS SYRINGE: 2 gram/0 mL | INTRAVENOUS | Qty: 20

## 2017-12-22 MED FILL — FENTANYL CITRATE (PF) 50 MCG/ML IJ SOLN: 50 mcg/mL | INTRAMUSCULAR | Qty: 4

## 2017-12-22 MED FILL — ATORVASTATIN 40 MG TAB: 40 mg | ORAL | Qty: 1

## 2017-12-22 MED FILL — NORMAL SALINE FLUSH 0.9 % INJECTION SYRINGE: INTRAMUSCULAR | Qty: 10

## 2017-12-22 MED FILL — ATROPINE 0.1 MG/ML SYRINGE: 0.1 mg/mL | INTRAMUSCULAR | Qty: 10

## 2017-12-22 MED FILL — LIDOCAINE HCL 1 % (10 MG/ML) IJ SOLN: 10 mg/mL (1 %) | INTRAMUSCULAR | Qty: 40

## 2017-12-22 MED FILL — ISOVUE-370  76 % INTRAVENOUS SOLUTION: 370 mg iodine /mL (76 %) | INTRAVENOUS | Qty: 50

## 2017-12-22 MED FILL — MIDAZOLAM 1 MG/ML IJ SOLN: 1 mg/mL | INTRAMUSCULAR | Qty: 10

## 2017-12-22 MED FILL — ELIQUIS 5 MG TABLET: 5 mg | ORAL | Qty: 1

## 2017-12-22 NOTE — Procedures (Signed)
DATE OF PROCEDURE: 12/22/2017    PROCEDURE:  Implantation of dual-chamber pacemaker with left upper extremity venogram and fluoroscopy.    HISTORY:  This is a 84 y.o.woman with irreversible bradycardia from atrial fibrillation slow ventricular rate and PVC.  She meets the indication for dual chamber pacer insertion as this is new diagnosis when he came in with a stroke.  The duration of atrial fibrillation is unknown so he may convert to sinus bradycardia later.  The risks and benefits were discussed with the patient and he agreed to proceed.    He has to continue with eliquis for stroke prevention    PROCEDURE IN DETAIL:  After the informed written consent had been obtained, the patient was brought to the Electrophysiology Suite where the patient was prepped and draped in the usual sterile fashion. Conscious sedation was initiated and maintained with intravenous Versed and fentanyl. Local anesthesia with 2% Xylocaine was given in the left infraclavicular area. 10cc contrast into the left antecubital vein showed left axillary vein patent.  The #10 blade scalpel was then used to make a 3 cm incision below the left clavicle.  Using the modified Seldinger technique 1 guidewire was advanced into the left axillary vein.  A subcutaneous device pocket was made in the inferomedial direction by blunt dissection.  Over the guidewire, a 9-French peel-away introducer dilator was then advanced inside the vein.  An active fixation pacing and sensing lead was positioned in the right ventricle distal septum.  The lead was anchored down with #2 Ethibond sutures after the pacing and sensing were optimal.  Over the retained guidewire another 7-French peel-away introducer dilator was then advanced inside the vein.  The active fixation pacing and sensing lead was then advanced and positioned in the right atrial appendage but sensing is low so it was moved to the lateral wall      There was no diaphragmatic stimulation with 10 volts  maximal output for either lead.      The pocket is now irrigated with antibiotic solution and the lead was connected to the device and inserted inside the pocket. Arista clotting powder was placed in the pocket. The pocket is now closed in three consecutive layers using 2-0 Vicryl sutures in continuous fashion.    Steri strip is now applied over the surgical wound.      COMPLICATIONS:  None.     ESTIMATED BLOOD LOSS: 30 mL.  2-0 vicryl suture was used for pursestring    IMPLANTED HARDWARE:      The pacemaker is a [employer]urity MRI DR, model # [id number], serial # [id number]    ATRIAL LEAD:  model # [id number] , serial # [id number]    VENTRICULAR LEAD:   model # [id number] and serial # [id number]    Specimen removed: NONE    DATA:  The atrial fibrillation wave is 0.6 mV, pacing impedance 490 ohms    The ventricular lead had the R wave sensing 8.8 mV and the pacing impedance 568 ohms and pacing threshold 1.7 volts at 0.4 ms.      PROGRAMMED PARAMETER:  The device is programmed to DDDR pacing mode with Mode switch ON and the low rate of 60 beats per minute and upper tracking, sensor rate of 130 beats per minute.        ASSESSMENT AND PLAN:  The patient will follow up with me in 1-2 weeks

## 2017-12-22 NOTE — Procedures (Signed)
Cardiac Procedure Note   Patient: Danny SpearmanBob D [name]  MRN: [id number]  SSN: [id number]   Date of Birth: [birth year] Age: 84 y.o.  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 Thurston PoundsMatthew Tatayana Beshears, MD    Assistant(s):  None  Anesthesia: Moderate Sedation   Estimated Blood Loss: Less than 10 mL   Specimens Removed: None  Findings: AFIB PVC  Complications: None   Implants: [employer] Jude dual chamber pacer  Signed by:  Thurston PoundsMatthew Ryer Asato, MD  12/22/2017  7:07 AM

## 2017-12-22 NOTE — Progress Notes (Signed)
Hospitalist Progress Note  NAME: Danny SpearmanBob D Reedy   DOB:  1932-10-20   MRN:  [id number]       Assessment / Plan:    .    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 Seen in consultation with neurology. 2 D Echo with normal EF. Cont. Aspirin, eliquis and lipitor.     .   Elevated blood pressure.  The patient has no history of hypertension.  We will monitor the patient's blood pressure closely. If the average blood pressure readings remain elevated, we will initiate antihypertensive medication for new onset essential hypertension.     .   Atrial Fib -SSS  -S/p ppm placement  -On eliquis  -TSH high t3/4 normal.Repeat in 6 weeks       .   Thrombocytopenia.  The etiology is not clear at this time.    -Hematologist rec's noted.  .    Osteoarthritis. Stable.      Body mass index is 23.86 kg/m??.    Code status: Full  Prophylaxis: Eliquis  Recommended Disposition: Home w/Family    Subjective:     Chief Complaint / Reason for Physician Visit  "Stroke".  Discussed with RN events overnight.     Objective:     VITALS:   Last 24hrs VS reviewed since prior progress note. Most recent are:  Patient Vitals for the past 24 hrs:   Temp Pulse Resp BP SpO2   12/22/17 1800 98.6 ??F (37 ??C) 76 16 141/67 95 %   12/22/17 1400 98.2 ??F (36.8 ??C) 95 18 125/71 95 %   12/22/17 1001 97.7 ??F (36.5 ??C) 81 13 157/78 94 %   12/22/17 0930 ??? 80 ??? (!) 210/76 100 %   12/22/17 0900 ??? 78 ??? 149/77 99 %   12/22/17 0845 ??? 77 ??? 148/76 99 %   12/22/17 0830 ??? 77 ??? 150/77 99 %   12/22/17 0815 ??? 77 ??? 149/76 99 %   12/22/17 0806 ??? 78 16 151/75 98 %   12/22/17 0754 ??? 63 10 154/75 100 %   12/22/17 0615 98 ??F (36.7 ??C) 65 17 163/77 96 %   12/22/17 0148 98 ??F (36.7 ??C) 82 14 163/66 96 %   12/21/17 2215 97.9 ??F (36.6 ??C) 64 20 93/79 96 %    12/21/17 1932 97.9 ??F (36.6 ??C) 91 21 143/63 96 %   12/21/17 1900 ??? 85 21 ??? 95 %       Intake/Output Summary (Last 24 hours) at 12/22/2017 1835  Last data filed at 12/22/2017 1810  Gross per 24 hour   Intake 460 ml   Output ???   Net 460 ml        PHYSICAL EXAM:  General: WD, WN. Alert, cooperative, no acute distress????  EENT:  EOMI. Anicteric sclerae. MMM  Resp:  CTA bilaterally, no wheezing or rales.  No accessory muscle use  CV:  IRIR,?? No edema  GI:  Soft, Non distended, Non tender. ??+Bowel sounds  Neurologic:?? Alert and oriented X 3, normal speech,   Psych:???? Good insight.??Not anxious nor agitated  Skin:  No rashes.  No jaundice    Reviewed most current lab test results and cultures  YES  Reviewed most current radiology test results   YES  Review and summation of old records today    NO  Reviewed patient's current orders and MAR    YES  PMH/SH reviewed - no change compared to H&P  ________________________________________________________________________  Care Plan discussed with:    Comments   Patient y    Family      RN y    Buyer, retail                            ________________________________________________________________________      Comments   >50% of visit spent in counseling and coordination of care y    ________________________________________________________________________  ZOXWRUE Zollie Beckers, MD     Procedures: see electronic medical records for all procedures/Xrays and details which were not copied into this note but were reviewed prior to creation of Plan.      LABS:  I reviewed today's most current labs and imaging studies.  Pertinent labs include:  Recent Labs     12/22/17  0155 12/21/17  1744 12/21/17  0548   WBC 7.3 6.4 5.4   HGB 15.1 14.7 13.5   HCT 43.1 42.1 41.1   PLT 88* 70* 80*     Recent Labs     12/22/17  0155 12/21/17  0548 12/20/17  0540   NA 142 144 142   K 3.8 4.0 4.0   CL 113* 113* 111*   CO2 22 25 24    GLU 94 84 83   BUN 12 11 10    CREA 0.97 1.12 1.00   CA 8.9 8.3* 8.6    MG 1.9  --  2.0   PHOS  --   --  2.4*   ALB  --   --  3.2*   TBILI  --   --  1.3*   SGOT  --   --  39*   ALT  --   --  41       Signed: [id number] Zollie Beckers, MD

## 2017-12-22 NOTE — Progress Notes (Signed)
TRANSFER - IN REPORT:    [facility] on [name]  being received from cath lab procedure area  for routine progression of care. Report consisted of patient???s Situation, Background, Assessment and Recommendations(SBAR). Information from the following report(s) Kardex and Procedure Summary was reviewed with the receiving clinician. Opportunity for questions and clarification was provided. Assessment completed upon patient???s arrival to Cardiac Cath Lab RECOVERY AREA and care assumed.       Cardiac Cath Lab Recovery Arrival Note:    [name] arrived to CCL recovery area.  Patient procedure= PPI. Patient on cardiac monitor, non-invasive blood pressure, SPO2 monitor. On RA .  IV  of NS on pump at 25 ml/hr. Patient status doing well without problems. Patient is A&Ox 3. Patient reports no pain.    PROCEDURE SITE CHECK:    Procedure site:without any bleeding and no hematoma, No pain/discomfort reported at procedure site.     No change in patient status. Continue to monitor patient and status.

## 2017-12-22 NOTE — Telephone Encounter (Signed)
-----   Message from Thurston PoundsMatthew Ngo, MD sent at 12/22/2017  9:03 AM EST -----  Has pacer today

## 2017-12-22 NOTE — Progress Notes (Signed)
Cardiac Electrophysiology Progress Note         12/22/2017 6:38 AM    Admit Date: 12/19/2017    Admit Diagnosis: Acute CVA (cerebrovascular accident) [facility])      Subjective:     [name] denied chest pain dyspnea  He has had recent stroke  Noted to be in atrial fibrillation with slow ventricular rate and PVCs  Past Medical History:   Diagnosis Date   ??? Arthritis    ??? Chronic pain     RIGHT HIP     No past surgical history.  Social History     Socioeconomic History   ??? Marital status: WIDOWED     Spouse name: Not on file   ??? Number of children: Not on file   ??? Years of education: Not on file   ??? Highest education level: Not on file   Social Needs   ??? Financial resource strain: Not on file   ??? Food insecurity - worry: Not on file   ??? Food insecurity - inability: Not on file   ??? Transportation needs - medical: Not on file   ??? Transportation needs - non-medical: Not on file   Occupational History   ??? Not on file   Tobacco Use   ??? Smoking status: Never Smoker   ??? Smokeless tobacco: Never Used   Substance and Sexual Activity   ??? Alcohol use: Yes     Comment: 2/WEEK VODKA   ??? Drug use: No   ??? Sexual activity: Not on file   Other Topics Concern   ??? Not on file   Social History Narrative   ??? Not on file       Visit Vitals  BP 163/77 (BP 1 Location: Right arm, BP Patient Position: At rest)   Pulse 65   Temp 98 ??F (36.7 ??C)   Resp 17   Ht 6' (1.829 m)   Wt 186 lb 1.1 oz (84.4 kg)   SpO2 96%   BMI 25.24 kg/m??     Current Facility-Administered Medications   Medication Dose Route Frequency   ??? influenza vaccine 2018-19 (6 mos+)(PF) (FLUARIX QUAD/FLULAVAL QUAD) injection 0.5 mL  0.5 mL IntraMUSCular PRIOR TO DISCHARGE   ??? apixaban (ELIQUIS) tablet 5 mg  5 mg Oral BID   ??? sodium chloride (NS) flush 5-40 mL  5-40 mL IntraVENous Q8H   ??? sodium chloride (NS) flush 5-40 mL  5-40 mL IntraVENous PRN   ??? ceFAZolin (ANCEF) 2 g/20 mL in sterile water IV syringe  2 g IntraVENous ON CALL    ??? hydrALAZINE (APRESOLINE) 20 mg/mL injection 10 mg  10 mg IntraVENous Q6H PRN   ??? acetaminophen (TYLENOL) tablet 650 mg  650 mg Oral Q4H PRN   ??? sodium chloride (NS) flush 5-10 mL  5-10 mL IntraVENous Q8H   ??? sodium chloride (NS) flush 5-10 mL  5-10 mL IntraVENous PRN   ??? acetaminophen (TYLENOL) tablet 650 mg  650 mg Oral Q4H PRN    Or   ??? acetaminophen (TYLENOL) solution 650 mg  650 mg Per NG tube Q4H PRN    Or   ??? acetaminophen (TYLENOL) suppository 650 mg  650 mg Rectal Q4H PRN   ??? ondansetron (ZOFRAN) injection 4 mg  4 mg IntraVENous Q6H PRN   ??? atorvastatin (LIPITOR) tablet 40 mg  40 mg Oral QHS   ??? bisacodyl (DULCOLAX) suppository 10 mg  10 mg Rectal DAILY PRN  Objective:      Physical Exam:  Visit Vitals  BP 163/77 (BP 1 Location: Right arm, BP Patient Position: At rest)   Pulse 65   Temp 98 ??F (36.7 ??C)   Resp 17   Ht 6' (1.829 m)   Wt 186 lb 1.1 oz (84.4 kg)   SpO2 96%   BMI 25.24 kg/m??     General Appearance:  Well developed, well nourished,alert and oriented x 3, and individual in no acute distress.   Ears/Nose/Mouth/Throat:   Hearing grossly normal.         Neck: Supple.   Chest:   Lungs clear to auscultation bilaterally.   Cardiovascular:  Irregular rhythm, no murmur.   Abdomen:   Soft, non-tender    Extremities: No edema bilaterally.    Skin: Warm and dry.               Data Review:   Labs:    Recent Results (from the past 24 hour(s))   T3, FREE    Collection Time: 12/21/17 11:43 AM   Result Value Ref Range    Free Triiodothyronine (T3) 2.6 2.2 - 4.0 pg/mL   T4, FREE    Collection Time: 12/21/17 11:43 AM   Result Value Ref Range    T4, Free 0.9 0.8 - 1.5 NG/DL   VITAMIN U72    Collection Time: 12/21/17  5:41 PM   Result Value Ref Range    Vitamin B12 438 193 - 986 pg/mL   FOLATE    Collection Time: 12/21/17  5:41 PM   Result Value Ref Range    Folate 11.2 5.0 - 21.0 ng/mL   CBC W/O DIFF    Collection Time: 12/21/17  5:44 PM   Result Value Ref Range    WBC 6.4 4.1 - 11.1 K/uL     RBC 4.25 4.10 - 5.70 M/uL    HGB 14.7 12.1 - 17.0 g/dL    HCT 53.6 64.4 - 03.4 %    MCV 99.1 (H) 80.0 - 99.0 FL    MCH 34.6 (H) 26.0 - 34.0 PG    MCHC 34.9 30.0 - 36.5 g/dL    RDW 74.2 59.5 - 63.8 %    PLATELET 70 (L) 150 - 400 K/uL    MPV 10.9 8.9 - 12.9 FL    NRBC 0.0 0 PER 100 WBC    ABSOLUTE NRBC 0.00 0.00 - 0.01 K/uL   METABOLIC PANEL, BASIC    Collection Time: 12/22/17  1:55 AM   Result Value Ref Range    Sodium 142 136 - 145 mmol/L    Potassium 3.8 3.5 - 5.1 mmol/L    Chloride 113 (H) 97 - 108 mmol/L    CO2 22 21 - 32 mmol/L    Anion gap 7 5 - 15 mmol/L    Glucose 94 65 - 100 mg/dL    BUN 12 6 - 20 MG/DL    Creatinine 7.56 4.33 - 1.30 MG/DL    BUN/Creatinine ratio 12 12 - 20      GFR est AA >60 >60 ml/min/1.73m2    GFR est non-AA >60 >60 ml/min/1.68m2    Calcium 8.9 8.5 - 10.1 MG/DL   MAGNESIUM    Collection Time: 12/22/17  1:55 AM   Result Value Ref Range    Magnesium 1.9 1.6 - 2.4 mg/dL   CBC W/O DIFF    Collection Time: 12/22/17  1:55 AM   Result Value Ref Range    WBC 7.3  4.1 - 11.1 K/uL    RBC 4.42 4.10 - 5.70 M/uL    HGB 15.1 12.1 - 17.0 g/dL    HCT 96.043.1 45.436.6 - 09.850.3 %    MCV 97.5 80.0 - 99.0 FL    MCH 34.2 (H) 26.0 - 34.0 PG    MCHC 35.0 30.0 - 36.5 g/dL    RDW 11.913.6 14.711.5 - 82.914.5 %    PLATELET 88 (L) 150 - 400 K/uL    MPV 11.3 8.9 - 12.9 FL    NRBC 0.0 0 PER 100 WBC    ABSOLUTE NRBC 0.00 0.00 - 0.01 K/uL       Telemetry: AFIB VPCs      Assessment:     Principal Problem:    Acute CVA (cerebrovascular accident) (HCC) (12/19/2017)    Active Problems:    Tachycardia-bradycardia syndrome (HCC) (12/21/2017)      Paroxysmal atrial fibrillation (HCC) (12/21/2017)      Pacemaker (12/21/2017)      Overview: 12/22/2017 St Jude dual chamber        Plan:     He meets indication for dual chamber pacer since we do not have if he has paroxysmal atrial fibrillation or not  He agrees to proceed  Continue with eliquis    Juliet RudeMatthew M. Natally Ribera, M.D. [facility] RomeFACC  Electrophysiology/Cardiology  [facility]   [address]                      [address]  [place], [place] [id number]                             [place] [id number]  [phone]                                        [phone]

## 2017-12-22 NOTE — Progress Notes (Signed)
Occupational Therapy: defer    Chart reviewed, noted patient currently off the floor in CCL.  OT will defer at this time and will f/u as able and appropriate.    Awilda Metroameron Williams, OTR/L

## 2017-12-22 NOTE — Progress Notes (Signed)
I tried to leave message for [name] that pacer is done but voice mail is full

## 2017-12-22 NOTE — Progress Notes (Signed)
TRANSFER - OUT REPORT:    Verbal report [facility] to [facility] on Andrey SpearmanBob D Chavous being transferred to Neuro  for routine progression of care       Report consisted of patient???s Situation, Background, Assessment and   Recommendations(SBAR).     Information from the following report(s) Procedure Summary was reviewed with the receiving nurse.    Opportunity for questions and clarification was provided.

## 2017-12-22 NOTE — Telephone Encounter (Signed)
Patient needs to be set up for wound and device check. [place] not currently [place]. Will ask PSR to contact patient to register in [place] and then notify nurse so appointment can be scheduled.

## 2017-12-22 NOTE — Procedures (Signed)
DATE OF PROCEDURE: 12/22/2017    PROCEDURE:  Implantation of dual-chamber pacemaker with left upper extremity venogram and fluoroscopy.    HISTORY:  This is a 84 y.o.woman with irreversible bradycardia from atrial fibrillation slow ventricular rate and PVC.  She meets the indication for dual chamber pacer insertion as this is new diagnosis when he came in with a stroke.  The duration of atrial fibrillation is unknown so he may convert to sinus bradycardia later.  The risks and benefits were discussed with the patient and he agreed to proceed.    He has to continue with eliquis for stroke prevention    PROCEDURE IN DETAIL:  After the informed written consent had been obtained, the patient was brought to the Electrophysiology Suite where the patient was prepped and draped in the usual sterile fashion. Conscious sedation was initiated and maintained with intravenous Versed and fentanyl. Local anesthesia with 2% Xylocaine was given in the left infraclavicular area. 10cc contrast into the left antecubital vein showed left axillary vein patent.  The #10 blade scalpel was then used to make a 3 cm incision below the left clavicle.  Using the modified Seldinger technique 1 guidewire was advanced into the left axillary vein.  A subcutaneous device pocket was made in the inferomedial direction by blunt dissection.  Over the guidewire, a 9-French peel-away introducer dilator was then advanced inside the vein.  An active fixation pacing and sensing lead was positioned in the right ventricle distal septum.  The lead was anchored down with #2 Ethibond sutures after the pacing and sensing were optimal.  Over the retained guidewire another 7-French peel-away introducer dilator was then advanced inside the vein.  The active fixation pacing and sensing lead was then advanced and positioned in the right atrial appendage but sensing is low so it was moved to the lateral wall       There was no diaphragmatic stimulation with 10 volts maximal output for either lead.      The pocket is now irrigated with antibiotic solution and the lead was connected to the device and inserted inside the pocket. Arista clotting powder was placed in the pocket. The pocket is now closed in three consecutive layers using 2-0 Vicryl sutures in continuous fashion.    Steri strip is now applied over the surgical wound.      COMPLICATIONS:  None.     ESTIMATED BLOOD LOSS: 30 mL.  2-0 vicryl suture was used for pursestring    IMPLANTED HARDWARE:      The pacemaker is a [employer] Jude Assurity MRI DR, model # [id number], serial # [id number]    ATRIAL LEAD:  model # [id number] , serial # [id number]    VENTRICULAR LEAD:   model # [id number] and serial # [id number]    Specimen removed: NONE    DATA:  The atrial fibrillation wave is 0.6 mV, pacing impedance 490 ohms    The ventricular lead had the R wave sensing 8.8 mV and the pacing impedance 568 ohms and pacing threshold 1.7 volts at 0.4 ms.      PROGRAMMED PARAMETER:  The device is programmed to DDDR pacing mode with Mode switch ON and the low rate of 60 beats per minute and upper tracking, sensor rate of 130 beats per minute.        ASSESSMENT AND PLAN:  The patient will follow up with me in 1-2 weeks

## 2017-12-22 NOTE — Progress Notes (Addendum)
[facility] Progress Note    12/22/2017 8:11 AM  Admit Date: 12/19/2017  Admit Diagnosis: Acute CVA (cerebrovascular accident) (HCC)    Assessment/Plan     1. ??Acute CVA - left frontal lobe with acute infarct on MRI, no hemorrhage or mass, evidence of chronic small vessel ischemia, also found to be in Afib and [facility]  -continue statin  -further management per neurology   2.  Atrial Fib - with significant bradycardia/SSS, s/p placement of dual chamber pacemaker this AM  -CHADS2VASC=5 for age, vascular disease, CVA, continue eliquis 5mg  BID   -TTE ok, TSH 8.7 but T4 0.9, will need repeat TFTs in 6 weeks with PCP   3.  Elevated blood pressure - continue to monitor, may need treatment reassess as op next week post dc  4.  Thrombocytopenia - etiology unknown at this time, Plt 88 K today, workup per hematology       Fam hx: no early CAD, Afib, CHF, pacemakers  Soc hx: no tobacco use, one alcoholic beverage nightly     9/6/041/8/19: placement of St Jude dual chamber pacer  TTE 12/21/17 - LVEF 65 % to 70 %, no WMA, RV moderately dilated, dilated RA, mild MR, mild to mod TR    Subjective:     [name] is sedated post pacemaker placement.  Very groggy.  Unable to obtain ROS.      Objective:      Physical Exam:  Visit Vitals  BP 125/71 (BP 1 Location: Right arm, BP Patient Position: At rest;Head of bed elevated (Comment degrees))   Pulse 95   Temp 98.2 ??F (36.8 ??C)   Resp 18   Ht 6' (1.829 m)   Wt 186 lb 1.1 oz (84.4 kg)   SpO2 95%   BMI 25.24 kg/m??     General Appearance:  Well developed, well nourished, groggy/sedated    Ears/Nose/Mouth/Throat:   Hearing grossly normal.         Neck: Supple.   Chest:   Lungs clear to auscultation anteriorly, left chest dressing dry and intact, no hematoma, no evidence of bleeding   Cardiovascular:  Regular rate and rhythm, S1, S2 normal, no murmur.   Abdomen:   Soft, non-tender, bowel sounds are active.   Extremities: No edema bilaterally.     Skin: Warm and dry.           Telemetry: AFIB    Data Review:   Labs:    Recent Results (from the past 24 hour(s))   METABOLIC PANEL, BASIC    Collection Time: 12/22/17  1:55 AM   Result Value Ref Range    Sodium 142 136 - 145 mmol/L    Potassium 3.8 3.5 - 5.1 mmol/L    Chloride 113 (H) 97 - 108 mmol/L    CO2 22 21 - 32 mmol/L    Anion gap 7 5 - 15 mmol/L    Glucose 94 65 - 100 mg/dL    BUN 12 6 - 20 MG/DL    Creatinine 5.400.97 9.810.70 - 1.30 MG/DL    BUN/Creatinine ratio 12 12 - 20      GFR est AA >60 >60 ml/min/1.7473m2    GFR est non-AA >60 >60 ml/min/1.2673m2    Calcium 8.9 8.5 - 10.1 MG/DL   MAGNESIUM    Collection Time: 12/22/17  1:55 AM   Result Value Ref Range    Magnesium 1.9 1.6 - 2.4 mg/dL   CBC W/O DIFF  Collection Time: 12/22/17  1:55 AM   Result Value Ref Range    WBC 7.3 4.1 - 11.1 K/uL    RBC 4.42 4.10 - 5.70 M/uL    HGB 15.1 12.1 - 17.0 g/dL    HCT 16.1 09.6 - 04.5 %    MCV 97.5 80.0 - 99.0 FL    MCH 34.2 (H) 26.0 - 34.0 PG    MCHC 35.0 30.0 - 36.5 g/dL    RDW 40.9 81.1 - 91.4 %    PLATELET 88 (L) 150 - 400 K/uL    MPV 11.3 8.9 - 12.9 FL    NRBC 0.0 0 PER 100 WBC    ABSOLUTE NRBC 0.00 0.00 - 0.01 K/uL           Current Facility-Administered Medications   Medication Dose Route Frequency   ??? vancomycin (VANCOCIN) injection 1,000 mg  1 g Topical ONCE   ??? sodium chloride (NS) flush 5-40 mL  5-40 mL IntraVENous Q8H   ??? sodium chloride (NS) flush 5-40 mL  5-40 mL IntraVENous PRN   ??? naloxone (NARCAN) injection 0.4 mg  0.4 mg IntraVENous PRN   ??? ceFAZolin (ANCEF) 2 g/20 mL in sterile water IV syringe  2 g IntraVENous Q8H   ??? influenza vaccine 2018-19 (6 mos+)(PF) (FLUARIX QUAD/FLULAVAL QUAD) injection 0.5 mL  0.5 mL IntraMUSCular PRIOR TO DISCHARGE   ??? apixaban (ELIQUIS) tablet 5 mg  5 mg Oral BID   ??? hydrALAZINE (APRESOLINE) 20 mg/mL injection 10 mg  10 mg IntraVENous Q6H PRN   ??? acetaminophen (TYLENOL) tablet 650 mg  650 mg Oral Q4H PRN   ??? sodium chloride (NS) flush 5-10 mL  5-10 mL IntraVENous Q8H    ??? sodium chloride (NS) flush 5-10 mL  5-10 mL IntraVENous PRN   ??? acetaminophen (TYLENOL) tablet 650 mg  650 mg Oral Q4H PRN    Or   ??? acetaminophen (TYLENOL) solution 650 mg  650 mg Per NG tube Q4H PRN    Or   ??? acetaminophen (TYLENOL) suppository 650 mg  650 mg Rectal Q4H PRN   ??? ondansetron (ZOFRAN) injection 4 mg  4 mg IntraVENous Q6H PRN   ??? atorvastatin (LIPITOR) tablet 40 mg  40 mg Oral QHS   ??? bisacodyl (DULCOLAX) suppository 10 mg  10 mg Rectal DAILY PRN       [name], MD  [facility]  [address], [address]  [place], [place] [address]  [phone]

## 2017-12-22 NOTE — Progress Notes (Signed)
Bedside shift change report given to Stacy (Cabin crewoncoming nurse) by Gustavo LahJenessa (offgoing nurse). Report included the following information SBAR, Kardex, Procedure Summary, MAR, Accordion, Recent Results and Cardiac Rhythm a fib; PVC.

## 2017-12-22 NOTE — Progress Notes (Signed)
Problem: Communication Impaired (Adult)  Goal: *Speech Goal: (INSERT TEXT)  Speech Therapy Goals  Initiated 12/22/2017  PATIENT WILL:  1.  Complete moderate level convergent and divergent naming tasks with min A  2.  Recall 2/3 words after 5 minute delay with use of strategies  3.  Further eval of writing skills   Speech LAnguage Pathology evaluation  Patient: [name] (84 y.o. 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 The patient has had periods of severe aphasia, especially around time of admission, but has mostly resolved. He does have some difficulty with higher level skills. Will benefit from continued tx in acute care and after discharge.     Patient will benefit from skilled intervention to address the above impairments.  Patient???s rehabilitation potential is considered to be Excellent  Factors which may influence rehabilitation potential include:   [x]               None noted  []               Mental ability/status  []               Medical condition  []               Home/family situation and support systems  []               Safety awareness  []               Pain tolerance/management  []               Other:      PLAN :  Recommendations and Planned Interventions:  SLP tx as above  Frequency/Duration: Patient will be followed by speech-language pathology 3 times a week to address goals.  Discharge Recommendations: Rehab, Home Health or Outpatient     SUBJECTIVE:   Patient stated ???It's much better but sometimes I can't call the word up???.    OBJECTIVE:     Past Medical History:   Diagnosis Date   ??? Arthritis    ??? Chronic pain     RIGHT HIP   History reviewed. No pertinent surgical history.  Prior Level of Function/Home Situation:   Home Situation  Home Environment: Private residence  # Steps to Enter: 3   Rails to Enter: Yes  Secondary school teacher : Bilateral  One/Two Story Residence: Two story  Living Alone: Yes  Support Systems: Friends \\ neighbors, Family member(s)  Patient Expects to be Discharged to:: Rehabilitation facility  Current DME Used/Available at Home: NoneMental Status:  Neurologic State: Alert  Orientation Level: Oriented to situation, Oriented to place, Oriented to person(a few days off on date)  Cognition: Follows commands  Perception: Appears intact  Perseveration: No perseveration noted  Safety/Judgement: Awareness of environment  Motor Speech:  Oral-Motor Structure/Motor Speech  Face: No impairment  Labial: No impairment  Dentition: Natural  Oral Hygiene: clean  Lingual: No impairment  Apraxic Characteristics: None  Dysarthric Characteristics: None  Intelligibility: No impairment  Intonation: WNL  Language Comprehension and Expression:  Auditory Comprehension  Auditory Impairment: No   Response to Basic Yes/No Questions (%): 100 %  Response to Moderately Complex Yes/No Questions (%): 100 %  One-Step Basic Commands (%): 100 %  Two-Step Basic Commands (%): 100 %  Verbal  Expression  Primary Mode of Expression: Verbal  Initiation: No impairment  Automatic Speech Task: No impairment  Repetition: No impairment(simple level)  Naming: (75%, also, patient used less common words for several items though these were not incorrect)  Sentence Completion: No impairment  Conversation: (fluent and pleasant, but defitis in higher level tasks)  Reading Comprehension  Scanning/Tracking : (no impairment with basic info )  Words : (100)  Sentence: (90%)     Neuro-Linguistics:              Thought Organization: able to name 6-7 items per minute     Memory: Impaired           Memory Exercises  Recall Message Parts: 3  Recall Message Time Delay: 4 minutes  Recall Message Accuracy (%): (0)                 Pragmatics:  Pragmatics Impairment: No impairment       NOMS: 5 expression    Pain:  Pain Scale 1: Numeric (0 - 10)   Pain Intensity 1: 0     After treatment:   []               Patient left in no apparent distress sitting up in chair  [x]               Patient left in no apparent distress in bed  [x]               Call bell left within reach  []               Nursing notified  [x]               Caregiver present  []               Bed alarm activated    COMMUNICATION/EDUCATION:   The patient???s plan of care including recommendations and planned interventions was discussed with: Registered Nurse.  Patient was educated regarding His deficit(s) of aphasia as this relates to His diagnosis of CVA.  He demonstrated Excellent understanding as evidenced by his responses.  [x]   Patient/family have participated as able in goal setting and plan of care.  [x]   Patient/family agree to work toward stated goals and plan of care.  []   Patient understands intent and goals of therapy, but is neutral about his/her participation.  []   Patient is unable to participate in goal setting and plan of care.    Thank you for this referral.  Donata ClayElizabeth W Thurman, SLP  Time Calculation: 17 mins

## 2017-12-22 NOTE — Other (Addendum)
TRANSFER - OUT REPORT:    Verbal report given to Docia Chuckracey HUfner, RN on [name] being transferred to Cath Lab Recovery for routine progression of care       Report consisted of patient???s Situation, Background, Assessment and   Recommendations(SBAR).     Information from the following report(s) Procedure Summary was reviewed with the receiving nurse.    Opportunity for questions and clarification was provided.    Cardiac Cath Lab Procedure Area Arrival Note:    [name] arrived to Cardiac Cath Lab, Procedure Area. Patient identifiers verified with NAME and DATE OF BIRTH. Procedure verified with patient. Consent forms verified. Allergies verified. Patient informed of procedure and plan of care. Questions answered with review. Patient voiced understanding of procedure and plan of care.    Patient on cardiac monitor, non-invasive blood pressure, SPO2 monitor. On  or O2 @ 2 lpm via NC.  IV of 0.9% NS on pump at 25 ml/hr. Patient status doing well without problems. Patient is A&Ox 4. Patient reports no pain.     Patient medicated during procedure with orders obtained and verified by Dr. Loma NewtonNgo.    Refer to patients Cardiac Cath Lab PROCEDURE REPORT for vital signs, assessment, status, and response during procedure, printed at end of case. Printed report on chart or scanned into chart.

## 2017-12-22 NOTE — Other (Signed)
Bedside shift change report given to Heather (Cabin crewoncoming nurse) by Kennyth ArnoldStacy (offgoing nurse). Report included the following information SBAR, Kardex, Procedure Summary, Intake/Output, MAR, Accordion, Recent Results and Cardiac Rhythm paced, PVCs.

## 2017-12-22 NOTE — Procedures (Signed)
Cardiac Procedure Note   Patient: [name]  MRN: [id number]  SSN: [id number]   Date of Birth: 08/29/1932 Age: 85 y.o.  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 Nikie Cid, MD    Assistant(s):  None  Anesthesia: Moderate Sedation   Estimated Blood Loss: Less than 10 mL   Specimens Removed: None  Findings: AFIB PVC  Complications: None   Implants: St Jude dual chamber pacer  Signed by:  Ringo Sherod, MD  12/22/2017  7:07 AM

## 2017-12-22 NOTE — Progress Notes (Signed)
TRANSFER - OUT REPORT:    Verbal report given to Tracy(name) on [name]  being transferred to cath lab(unit) for ordered procedure       Report consisted of patient???s Situation, Background, Assessment and   Recommendations(SBAR).     Information from the following report(s) SBAR, Kardex, Procedure Summary, Accordion and Cardiac Rhythm A fib;PVC was reviewed with the receiving nurse.    Lines:   Peripheral IV 12/19/17 Right Antecubital (Active)   Site Assessment Clean, dry, & intact 12/22/2017  4:00 AM   Phlebitis Assessment 0 12/22/2017  4:00 AM   Infiltration Assessment 0 12/22/2017  4:00 AM   Dressing Status Clean, dry, & intact 12/22/2017  4:00 AM   Dressing Type Transparent;Tape 12/22/2017  4:00 AM   Hub Color/Line Status Pink;Capped 12/22/2017  4:00 AM   Action Taken Open ports on tubing capped 12/22/2017  4:00 AM   Alcohol Cap Used Yes 12/22/2017  4:00 AM       Peripheral IV 12/21/17 Left Forearm (Active)   Site Assessment Clean, dry, & intact 12/22/2017  4:00 AM   Phlebitis Assessment 0 12/22/2017  4:00 AM   Infiltration Assessment 0 12/22/2017  4:00 AM   Dressing Status Clean, dry, & intact 12/22/2017  4:00 AM   Dressing Type Transparent;Tape 12/22/2017  4:00 AM   Hub Color/Line Status Pink;Capped 12/22/2017  4:00 AM   Action Taken Open ports on tubing capped 12/22/2017  4:00 AM   Alcohol Cap Used Yes 12/22/2017  4:00 AM        Opportunity for questions and clarification was provided.      Patient transported with:   Monitor  Tech

## 2017-12-23 ENCOUNTER — Encounter: Primary: Family

## 2017-12-23 LAB — ANA BY MULTIPLEX FLOW IA, QL
ANA, Direct: NEGATIVE
ANA: NEGATIVE

## 2017-12-23 LAB — METABOLIC PANEL, BASIC
Anion gap: 8 mmol/L (ref 5–15)
BUN/Creatinine ratio: 9 — ABNORMAL LOW (ref 12–20)
BUN: 9 MG/DL (ref 6–20)
CO2: 23 mmol/L (ref 21–32)
Calcium: 9.1 MG/DL (ref 8.5–10.1)
Chloride: 108 mmol/L (ref 97–108)
Creatinine: 0.99 MG/DL (ref 0.70–1.30)
GFR est AA: >60 mL/min/{1.73_m2} (ref >60)
GFR est non-AA: >60 mL/min/{1.73_m2} (ref >60)
Glucose: 117 mg/dL — ABNORMAL HIGH (ref 65–100)
Potassium: 4 mmol/L (ref 3.5–5.1)
Sodium: 139 mmol/L (ref 136–145)

## 2017-12-23 LAB — CBC W/O DIFF
ABSOLUTE NRBC: 0 10*3/uL (ref 0.00–0.01)
HCT: 46 % (ref 36.6–50.3)
HGB: 15.5 g/dL (ref 12.1–17.0)
MCH: 33.5 PG (ref 26.0–34.0)
MCHC: 33.7 g/dL (ref 30.0–36.5)
MCV: 99.4 FL — ABNORMAL HIGH (ref 80.0–99.0)
MPV: 11.3 FL (ref 8.9–12.9)
NRBC: 0 PER 100 WBC
PLATELET: 90 10*3/uL — ABNORMAL LOW (ref 150–400)
RBC: 4.63 M/uL (ref 4.10–5.70)
RDW: 13.6 % (ref 11.5–14.5)
WBC: 11.3 10*3/uL — ABNORMAL HIGH (ref 4.1–11.1)

## 2017-12-23 LAB — MAGNESIUM: Magnesium: 1.9 mg/dL (ref 1.6–2.4)

## 2017-12-23 LAB — PHOSPHORUS: Phosphorus: 3.4 MG/DL (ref 2.6–4.7)

## 2017-12-23 MED ORDER — CEPHALEXIN 250 MG CAP
250 mg | Freq: Three times a day (TID) | ORAL | Status: DC
Start: 2017-12-23 — End: 2017-12-24
  Administered 2017-12-23 – 2017-12-24 (×3): via ORAL

## 2017-12-23 MED FILL — ELIQUIS 5 MG TABLET: 5 mg | ORAL | Qty: 1

## 2017-12-23 MED FILL — CEPHALEXIN 250 MG CAP: 250 mg | ORAL | Qty: 1

## 2017-12-23 MED FILL — ATORVASTATIN 40 MG TAB: 40 mg | ORAL | Qty: 1

## 2017-12-23 MED FILL — ACETAMINOPHEN 325 MG TABLET: 325 mg | ORAL | Qty: 2

## 2017-12-23 MED FILL — NORMAL SALINE FLUSH 0.9 % INJECTION SYRINGE: INTRAMUSCULAR | Qty: 10

## 2017-12-23 NOTE — Progress Notes (Signed)
Problem: Falls - Risk of  Goal: *Absence of Falls  Document Schmid Fall Risk and appropriate interventions in the flowsheet.  Outcome: Progressing Towards Goal  Fall Risk Interventions:  Mobility Interventions: Assess mobility with egress test, Bed/chair exit alarm, Communicate number of staff needed for ambulation/transfer, Patient to call before getting OOB, PT Consult for mobility concerns    Mentation Interventions: Adequate sleep, hydration, pain control, Bed/chair exit alarm, Door open when patient unattended, Evaluate medications/consider consulting pharmacy, More frequent rounding, Reorient patient, Room close to nurse's station, Toileting rounds, Update white board    Medication Interventions: Evaluate medications/consider consulting pharmacy, Patient to call before getting OOB, Teach patient to arise slowly    Elimination Interventions: Bed/chair exit alarm, Call light in reach, Patient to call for help with toileting needs, Toileting schedule/hourly rounds, Urinal in reach             Problem: Pressure Injury - Risk of  Goal: *Prevention of pressure injury  Document Braden Scale and appropriate interventions in the flowsheet.  Outcome: Progressing Towards Goal  Pressure Injury Interventions:  Sensory Interventions: Assess changes in LOC, Check visual cues for pain, Keep linens dry and wrinkle-free, Minimize linen layers, Pressure redistribution bed/mattress (bed type)    Moisture Interventions: Absorbent underpads, Minimize layers, Offer toileting Q_hr    Activity Interventions: Increase time out of bed, Pressure redistribution bed/mattress(bed type), PT/OT evaluation    Mobility Interventions: HOB 30 degrees or less, Pressure redistribution bed/mattress (bed type), PT/OT evaluation    Nutrition Interventions: Document food/fluid/supplement intake    Friction and Shear Interventions: HOB 30 degrees or less, Lift sheet, Lift team/patient mobility team

## 2017-12-23 NOTE — Progress Notes (Signed)
Problem: Self Care Deficits Care Plan (Adult)  Goal: *Acute Goals and Plan of Care (Insert Text)  Occupational Therapy Goals  Initiated 12/21/2017   1.  Patient will perform grooming standing at sink for 5 min without LOB with supervision/set-up within 7 day(s).  2.  Patient will perform lower body dressing with supervision/set-up within 7 day(s).  3.  Patient will perform bathing with supervision/set-up within 7 day(s).  4.  Patient will perform toilet transfers with supervision/set-up within 7 day(s).  5.  Patient will perform all aspects of toileting with supervision/set-up within 7 day(s).  6.  Patient will appropriately sequence steps for fully ADL routine with supervision/ set-up within 7 day(s).  7.  Patient will improved MOCA score by 5 points in preparation for functional tasks within 7 day(s).   Occupational Therapy TREATMENT  Patient: [name] (84 y.o. male)  Date: 12/23/2017  Diagnosis: Acute CVA (cerebrovascular accident) [facility]) Acute CVA (cerebrovascular accident) [facility])      Precautions: Fall  Chart, occupational therapy assessment, plan of care, and goals were reviewed.    ASSESSMENT:  Patient continues to demonstrate impulsivity, decreased insight into deficits, impaired dynamic balance needed for ADL tasks, fall risk and decline in functional status. Pt is POD 1 from PPM and reviewed use of sling and PPM precautions with pt and nephew. This date, pt was supervision for bed mobility with cues to avoid WB through L UE, min A x1 for sit to stand due to unsteadiness. Pt demonstrated decreased memory and recall after 6 minutes as well as decreased sequencing for tasks. He is needing constant CGA to min A for balance/mobility without device and CGA to min A for ADLs. Recommend IP rehab.  Progression toward goals:  []        Improving appropriately and progressing toward goals  [x]        Improving slowly and progressing toward goals  []        Not making progress toward goals and plan of care will be  adjusted     PLAN:  Patient continues to benefit from skilled intervention to address the above impairments.  Continue treatment per established plan of care.  Discharge Recommendations:  Inpatient Rehab  Further Equipment Recommendations for Discharge:  TBD     SUBJECTIVE:   Patient stated ???So I can't get up by myself????    OBJECTIVE DATA SUMMARY:   Cognitive/Behavioral Status:  Neurologic State: Alert  Orientation Level: Oriented to person;Oriented to situation;Oriented to time  Cognition: Follows commands  Perception: Appears intact  Perseveration: No perseveration noted  Safety/Judgement: Decreased insight into deficits;Decreased awareness of need for assistance  Functional Mobility and Transfers for ADLs:Bed Mobility:  Supine to Sit: Supervision(cues to prevent WB L UE 2* PPM precautions)    Transfers:  Sit to Stand: Minimum assistance;Assist x1;Additional time     Bed to Chair: Minimum assistance;Assist x1;Additional time    Balance:  Sitting: Intact  Standing: Impaired;With support  Standing - Static: Constant support;Fair  Standing - Dynamic : Fair  ADL Intervention:   pt was given 4 words to remember to access delayed recall. Pt able to immediately recall 4/4 correctly. After 6 minutes, pt unable to recall any words (0/4). When given category, pt recalled 2/4. Pt was unable to correctly name remaining 2/4 with multiple choice of 3.     Grooming  Brushing Teeth: Contact guard assistance;Minimum assistance(impaired higher level balance)  Cognitive Retraining  Problem Solving: Identifying the task  Following Commands: Follows one step commands/directions  Safety/Judgement: Decreased insight into deficits;Decreased awareness of need for assistance  Pain:  Pain Scale 1: Numeric (0 - 10)  Pain Intensity 1: 0              Activity Tolerance:   VSS  Please refer to the flowsheet for vital signs taken during this treatment.  After treatment:    [x]  Patient left in no apparent distress sitting up in chair  []  Patient left in no apparent distress in bed  [x]  Call bell left within reach  [x]  Nursing notified  [x]  Caregiver present  [x]  Bed alarm activated    COMMUNICATION/COLLABORATION:   The patient???s plan of care was discussed with: Physical Therapist, Registered Nurse and Social Worker    Haze RushingWhitney A Mercer, OT  Time Calculation: 21 mins

## 2017-12-23 NOTE — Progress Notes (Addendum)
CM met with patient at bedside in order to discuss therapy recommendations of inpatient rehab- the CM provided extensive education on acute inpatient rehab, etc. The patient stated, "oh well I don't know if that is necessary I will think about it." The CM provided the patient with a list of acute rehab facilities at bedside for review. Unclear at this time if patient will be agreeable, patient is alert and oriented- MPOA's on file are Marcheta Grammes (neighbor) and Marnee Spring (sister-in-law).CM will continue to follow. Theodosia Quay, MSW    14:09 p.m.- CM met with patient, sister-in-law/MPOA Marnee Spring, brother Tayvion Lauder, and neighbor/close friend at bedside. The patient gave permission to discuss disposition in front of all present. Patient is agreeable for inpatient rehab, CM provided education to patient and family on the inpatient rehab, referral process, etc. Patient and family would also like Siri Cole to be updated (patient's nephew). The family stated they could provide transport if therapy states patient can transport via family vehicle. Patient and family would like referral sent to [facility] on [address]. CM sent referral via [employer]. CM will continue to follow and speak with therapy regarding best mode of transport for the patient. Theodosia Quay, MSW    14:30 p.m.- CM received call from [place], [place] with [facility], she stated she is having case reviewed- also stated she will contact the patient's nephew/MPOA Marnee Spring.

## 2017-12-23 NOTE — Telephone Encounter (Signed)
[place] scheduled [place] [facility]. Patient scheduled for wound check 01/01/18 at 10:45 am.

## 2017-12-23 NOTE — Progress Notes (Signed)
Hospitalist Progress Note  NAME: [name]   DOB:  [birth year]   MRN:  [id number]       Assessment / Plan:    .    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 Seen in consultation with neurology. 2 D Echo with normal EF. Cont. Aspirin, eliquis and lipitor.     .   Elevated blood pressure.  The patient has no history of hypertension.  We will monitor the patient's blood pressure closely. If the average blood pressure readings remain elevated, we will initiate antihypertensive medication for new onset essential hypertension.     .   Atrial Fib -SSS  -S/p ppm placement  -On eliquis  -TSH high t3/4 normal.Repeat in 6 weeks       .   Thrombocytopenia.  The etiology is not clear at this time.    -Hematologist rec's noted.  .    Osteoarthritis. Stable.      Body mass index is 23.86 kg/m??.    Code status: Full  Prophylaxis: Eliquis  Recommended Disposition:Ready for SNF when accepted.    Subjective:     Chief Complaint / Reason for Physician Visit  "Stroke".  Discussed with RN events overnight.     Objective:     VITALS:   Last 24hrs VS reviewed since prior progress note. Most recent are:  Patient Vitals for the past 24 hrs:   Temp Pulse Resp BP SpO2   12/23/17 1800 98.8 ??F (37.1 ??C) 85 18 145/84 96 %   12/23/17 1400 98.2 ??F (36.8 ??C) 90 18 113/66 96 %   12/23/17 1000 98.1 ??F (36.7 ??C) 85 15 115/67 95 %   12/23/17 0600 98.4 ??F (36.9 ??C) 83 15 154/82 ???   12/23/17 0200 99.3 ??F (37.4 ??C) 71 17 148/77 94 %   12/22/17 2200 99.2 ??F (37.3 ??C) 72 19 159/72 94 %       Intake/Output Summary (Last 24 hours) at 12/23/2017 1901  Last data filed at 12/23/2017 1200  Gross per 24 hour   Intake 340 ml   Output ???   Net 340 ml        PHYSICAL EXAM:  General: WD, WN. Alert, cooperative, no acute distress????  EENT:  EOMI. Anicteric sclerae. MMM   Resp:  CTA bilaterally, no wheezing or rales.  No accessory muscle use  CV:  IRIR,?? No edema  GI:  Soft, Non distended, Non tender. ??+Bowel sounds  Neurologic:?? Alert and oriented X 3, normal speech,   Psych:???? Good insight.??Not anxious nor agitated  Skin:  No rashes.  No jaundice    Reviewed most current lab test results and cultures  YES  Reviewed most current radiology test results   YES  Review and summation of old records today    NO  Reviewed patient's current orders and MAR    YES  PMH/SH reviewed - no change compared to H&P  ________________________________________________________________________  Care Plan discussed with:    Comments   Patient y    Family      RN y    Buyer, retailCare Manager     Consultant                            ________________________________________________________________________      Comments   >50% of  visit spent in counseling and coordination of care y    ________________________________________________________________________  ZOXWRUE Zollie Beckers, MD     Procedures: see electronic medical records for all procedures/Xrays and details which were not copied into this note but were reviewed prior to creation of Plan.      LABS:  I reviewed today's most current labs and imaging studies.  Pertinent labs include:  Recent Labs     12/23/17  0543 12/22/17  0155 12/21/17  1744   WBC 11.3* 7.3 6.4   HGB 15.5 15.1 14.7   HCT 46.0 43.1 42.1   PLT 90* 88* 70*     Recent Labs     12/23/17  0543 12/22/17  0155 12/21/17  0548   NA 139 142 144   K 4.0 3.8 4.0   CL 108 113* 113*   CO2 23 22 25    GLU 117* 94 84   BUN 9 12 11    CREA 0.99 0.97 1.12   CA 9.1 8.9 8.3*   MG 1.9 1.9  --    PHOS 3.4  --   --        Signed: [id number] Zollie Beckers, MD

## 2017-12-23 NOTE — Progress Notes (Signed)
Problem: Falls - Risk of  Goal: *Absence of Falls  Document Schmid Fall Risk and appropriate interventions in the flowsheet.  Outcome: Progressing Towards Goal  Fall Risk Interventions:  Mobility Interventions: Assess mobility with egress test    Mentation Interventions: Adequate sleep, hydration, pain control    Medication Interventions: Patient to call before getting OOB    Elimination Interventions: Bed/chair exit alarm             Problem: Pressure Injury - Risk of  Goal: *Prevention of pressure injury  Document Braden Scale and appropriate interventions in the flowsheet.  Outcome: Progressing Towards Goal  Pressure Injury Interventions:  Sensory Interventions: Assess changes in LOC    Moisture Interventions: Absorbent underpads    Activity Interventions: Assess need for specialty bed    Mobility Interventions: Assess need for specialty bed    Nutrition Interventions: Document food/fluid/supplement intake    Friction and Shear Interventions: Lift sheet

## 2017-12-23 NOTE — Progress Notes (Addendum)
Problem: Communication Impaired (Adult)  Goal: *Speech Goal: (INSERT TEXT)  Speech Therapy Goals  Initiated 12/22/2017  PATIENT WILL:  1.  Complete moderate level convergent and divergent naming tasks with min A  2.  Recall 2/3 words after 5 minute delay with use of strategies  3.  Further eval of writing skills    Speech language pathology treatment  Patient: [name] ([age] y.o. male)  Date: 12/23/2017  Diagnosis: Acute CVA (cerebrovascular accident) [facility](HCC) Acute CVA (cerebrovascular accident) [facility](HCC)        ASSESSMENT:  Patinet continues with mild aphasia and more significant memory deficits. Writing further assessed today with results below. Patient is very pleasant but has poor insight. Significant time spent in education re: value of rehab at request of nephew and afterwards he was agreeable to rehab, but nephew is concerned that this may change.   Progression toward goals:  [x]        Improving appropriately and progressing toward goals  []        Improving slowly and progressing toward goals  []        Not making progress toward goals and plan of care will be adjusted     PLAN:  Patient continues to benefit from skilled intervention to address the above impairments.  Continue treatment per established plan of care.  Discharge Recommendations:  Inpatient Rehab     SUBJECTIVE:   Patient stated ???I think I've always written this way???.    OBJECTIVE:   Mental Status:  Neurologic State: Alert  Orientation Level: Oriented to person, Oriented to situation, Oriented to time  Cognition: Follows commands  Perception: Appears intact  Perseveration: No perseveration noted  Safety/Judgement: Decreased insight into deficits, Decreased awareness of need for assistance  Treatment & Interventions:  Motor Speech:            WNL                       Language Comprehension and Expression:  Auditory Comprehension   Response to Basic Yes/No Questions (%): 100 %  Verbal Expression                        Naming: Impaired         Divergent (%): (moderately slow)                          Reading Comprehension   Visual Impairment: Glasses/contacts  Pre-Morbid Reading Status: Literate  Written Expression  Legibility : Decreased legibility;Uses dominant hand(patient reports legibility it same as promorbid, writing is mostly legible but may have soe new errors)  Dictation : Impaired(mild erros in omitting letters)  Formulation: (again, mild errors in omitting letters.  No word errors or paraphasias. )  Interfering Components: (patient with poor shpe of number curve with clock drawing, draws clock very small, does use corect numbers. Mild visuospatial deficits evident)  Neuro-Linguistics:                                Memory Exercises  Recall Message Parts: 3  Recall Message Time Delay: 5 minutes  Recall Message Accuracy (%): 0 %                  Response & Tolerance to Activities:     Pain:  Pain Scale 1: Numeric (0 - 10)  Pain Intensity 1:  0     After treatment:   []        Patient left in no apparent distress sitting up in chair  [x]        Patient left in no apparent distress in bed  [x]        Call bell left within reach  []        Nursing notified  [x]        Caregiver present  []        Bed alarm activated    COMMUNICATION/COLLABORATION:   Patient was educated at length regarding IMPORTANCE OF REHAB IN MAXIMIZING SAFETY AND INDEPENDENCE AT HOME. He expressed agreement afterwards  The patient???s plan of care was discussed with: Registered Nurse    Donata Clay, SLP  Time Calculation: 15 mins

## 2017-12-23 NOTE — Progress Notes (Signed)
Problem: Mobility Impaired (Adult and Pediatric)  Goal: *Acute Goals and Plan of Care (Insert Text)  Physical Therapy Goals  Initiated 12/21/2017  1.  Patient will move from supine to sit and sit to supine  in bed with modified independence within 7 day(s).    2.  Patient will transfer from bed to chair and chair to bed with modified independence using the least restrictive device within 7 day(s).  3.  Patient will perform sit to stand with modified independence within 7 day(s).  4.  Patient will ambulate with modified independence for 200 feet with the least restrictive device within 7 day(s).   5.  Patient will ascend/descend 12 stairs with single handrail(s) with minimal assistance/contact guard assist within 7 day(s).  6.  Patient will improve Berg Balance score by 7 points within 7 days.     physical Therapy TREATMENT  Patient: [name] ([age] y.o. male)  Date: 12/23/2017  Diagnosis: Acute CVA (cerebrovascular accident) [facility](HCC) Acute CVA (cerebrovascular accident) [facility](HCC)      Precautions: Fall  Chart, physical therapy assessment, plan of care and goals were reviewed.    ASSESSMENT:  Patient received supine in bed with nephew present in room. Pt with pacemaker placed yesterday. Reviewed pacemaker precautions, but will need reinforcement due to impaired memory. Pt tolerated session fairly well. Pt continues to require 1 person assistance for any OOB mobility. Pt with impaired balance and gait requiring constant CGA-min A as pt remains at increased risk for falls. Pt also with decreased insight into deficits and poor safety awareness. During gait, pt demonstrated narrow BOS, decreased gait speed, LOB during head turns when talking to the therapist. Pt remained seated in chair with bed alarm set. Pt continues to present with impaired memory which also impacts safety. Prior to admission, pt was living alone and independent. At this time, pt would benefit from  inpatient rehab upon discharge to continue therapy efforts and reach highest level of independence.   Progression toward goals:  [x]     Improving appropriately and progressing toward goals  []     Improving slowly and progressing toward goals  []     Not making progress toward goals and plan of care will be adjusted     PLAN:  Patient continues to benefit from skilled intervention to address the above impairments.  Continue treatment per established plan of care.  Discharge Recommendations:  Inpatient Rehab  Further Equipment Recommendations for Discharge:  tbd     SUBJECTIVE:   Patient stated ???I feel a little off.???    OBJECTIVE DATA SUMMARY:   Critical Behavior:  Neurologic State: Alert  Orientation Level: Oriented to person, Oriented to situation, Oriented to time  Cognition: Follows commands  Safety/Judgement: Decreased insight into deficits, Decreased awareness of need for assistance  Functional Mobility Training:  Bed Mobility:     Supine to Sit: Supervision(cues to prevent WB L UE 2* PPM precautions)              Transfers:  Sit to Stand: Minimum assistance;Assist x1;Additional time           Bed to Chair: Minimum assistance;Assist x1;Additional time                    Balance:  Sitting: Intact  Standing: Impaired;With support  Standing - Static: Constant support;Fair  Standing - Dynamic : FairAmbulation/Gait Training:  Distance (ft): 80 Feet (ft)  Assistive Device: Gait belt  Ambulation - Level of Assistance: Minimal assistance  Gait Abnormalities: Decreased step clearance;Path deviations;Trunk sway increased        Base of Support: Narrowed     Speed/Cadence: Pace decreased (<100 feet/min);Shuffled  Step Length: Right shortened;Left shortened      Pain:  Pain Scale 1: Numeric (0 - 10)  Pain Intensity 1: 0              Activity Tolerance:   Fair. vss  Please refer to the flowsheet for vital signs taken during this treatment.  After treatment:    [x]     Patient left in no apparent distress sitting up in chair  []     Patient left in no apparent distress in bed  [x]     Call bell left within reach  [x]     Nursing notified  [x]     Caregiver present  [x]     Bed alarm activated    COMMUNICATION/COLLABORATION:   The patient???s plan of care was discussed with: Occupational Therapist and Registered Nurse    Hamilton Capri, PT, DPT   Time Calculation: 23 mins

## 2017-12-23 NOTE — Progress Notes (Addendum)
Cardiac Electrophysiology Wound Check Note      Wound Check   Patient is seen for wound check; he is s/p St. Jude dual chamber pacemaker implant on 12/22/2017 for irreversible bradycardia from atrial fibrillation with slow ventricular rate & PVCs.    No fever, drainage.  Soreness improving per patient.    Of note, procedure performed while patient was still taking Eliquis; not held due to recent CVA.      Physical Exam   Constitutional: well-developed and well-nourished.   Skin: Left side pocket with intact dressing.  Small area of sanguinous drainage noted, but does not appear fresh.  Ecchymosis noted, but no hematoma.      ASSESSMENT and PLAN   The incision is without drainage or hematoma; ecchymosis is noted.  Dressing remains intact.    Device check on POD #1 showed proper lead & generator function.    Keflex 250 mg po tid x 5 days for infection prophylaxis.    Reiterated left arm ROM & lifting restrictions.  Reiterated wound assessment & indications to call clinic.    Follow up as noted below.    Future Appointments   Date Time Provider Department Center   12/29/2017  8:00 AM Marcellus ScottBrowning, Christine M[facility]   01/01/2018 10:45 AM Frederick PeersPACEMAKER3, Billie LadeEYNOLDS [facility] SCHED   01/01/2018 11:00 AM Wound check, [facility] SCHED       Costella HatcherMeredith Sherman, FNP-C  [facility]  12/23/17    Addendum from EP attending:  I have discussed with nurse practitioner and device company rep  His pacemaker was checked this am and showed proper function  Wound check again in clinic        Juliet RudeMatthew M. Sanai Frick, M.D. [facility]ACC  Electrophysiology/Cardiology  [facility]  [address]  [place], [place] [id number]                             [place] [id number]  [phone]                                        [phone]

## 2017-12-23 NOTE — Other (Signed)
Bedside shift change report given to [facility] (Cabin crewoncoming nurse) by Kennyth ArnoldStacy (offgoing nurse). Report included the following information SBAR, Kardex, Procedure Summary, Intake/Output, MAR, Accordion and Cardiac Rhythm paced, pvcs.

## 2017-12-24 LAB — CBC W/O DIFF
ABSOLUTE NRBC: 0 10*3/uL (ref 0.00–0.01)
HCT: 45.8 % (ref 36.6–50.3)
HGB: 15.5 g/dL (ref 12.1–17.0)
MCH: 33.5 PG (ref 26.0–34.0)
MCHC: 33.8 g/dL (ref 30.0–36.5)
MCV: 99.1 FL — ABNORMAL HIGH (ref 80.0–99.0)
MPV: 11.6 FL (ref 8.9–12.9)
NRBC: 0 PER 100 WBC
PLATELET: 92 10*3/uL — ABNORMAL LOW (ref 150–400)
RBC: 4.62 M/uL (ref 4.10–5.70)
RDW: 13.7 % (ref 11.5–14.5)
WBC: 11.6 10*3/uL — ABNORMAL HIGH (ref 4.1–11.1)

## 2017-12-24 LAB — PROTEIN ELECTROPHORESIS
A/G ratio: 1.4 (ref 0.7–1.7)
ALPHA-2 GLOBULIN: 0.6 g/dL (ref 0.4–1.0)
Albumin: 3.6 g/dL (ref 2.9–4.4)
Alpha-1-globulin: 0.2 g/dL (ref 0.0–0.4)
Beta globulin: 1 g/dL (ref 0.7–1.3)
Gamma globulin: 0.8 g/dL (ref 0.4–1.8)
Globulin, total: 2.6 g/dL (ref 2.2–3.9)
Protein, total: 6.2 g/dL (ref 6.0–8.5)

## 2017-12-24 MED ORDER — CEPHALEXIN 250 MG CAP
250 mg | ORAL_CAPSULE | Freq: Three times a day (TID) | ORAL | 0 refills | Status: AC
Start: 2017-12-24 — End: 2017-12-28

## 2017-12-24 MED ORDER — APIXABAN 5 MG TABLET
5 mg | ORAL_TABLET | Freq: Two times a day (BID) | ORAL | 0 refills | Status: DC
Start: 2017-12-24 — End: 2018-01-26

## 2017-12-24 MED ORDER — ATORVASTATIN 40 MG TAB
40 mg | ORAL_TABLET | Freq: Every evening | ORAL | 0 refills | Status: DC
Start: 2017-12-24 — End: 2018-01-26

## 2017-12-24 MED FILL — ATORVASTATIN 40 MG TAB: 40 mg | ORAL | Qty: 1

## 2017-12-24 MED FILL — CEPHALEXIN 250 MG CAP: 250 mg | ORAL | Qty: 1

## 2017-12-24 MED FILL — NORMAL SALINE FLUSH 0.9 % INJECTION SYRINGE: INTRAMUSCULAR | Qty: 10

## 2017-12-24 MED FILL — FLUARIX QUAD 2018-2019 (PF) 60 MCG (15 MCG X 4)/0.5 ML IM SYRINGE: 60 mcg (15 mcg x 4)/0.5 mL | INTRAMUSCULAR | Qty: 0.5

## 2017-12-24 MED FILL — ACETAMINOPHEN 325 MG TABLET: 325 mg | ORAL | Qty: 2

## 2017-12-24 MED FILL — ELIQUIS 5 MG TABLET: 5 mg | ORAL | Qty: 1

## 2017-12-24 NOTE — Discharge Summary (Signed)
Discharge Summary by Madelaine Bhat, MD at 12/24/17 1516                Author: Madelaine Bhat, MD  Service: Hospitalist  Author Type: Physician       Filed: 12/26/17 2033  Date of Service: 12/24/17 1516  Status: Signed          Editor: Madelaine Bhat, MD (Physician)                       Discharge Summary           PATIENT ID: [name]   MRN: [id number]     DATE OF BIRTH: [birth year]      DATE OF ADMISSION: 12/19/2017  5:30 PM      DATE OF DISCHARGE:  12/24/2017    PRIMARY CARE PROVIDER:  None       ATTENDING PHYSICIAN: Madelaine Bhat, MD      DISCHARGING PROVIDER:  Madelaine Bhat, MD     To contact this individual call [phone] and ask the operator to page.  If unavailable ask to be transferred the Adult Hospitalist Department.      CONSULTATIONS: IP CONSULT TO NEUROLOGY   IP CONSULT TO CARDIOLOGY      PROCEDURES/SURGERIES: * No surgery found *      ADMITTING DIAGNOSES & HOSPITAL  COURSE:       84 year old man with a past medical history significant for arthritis.  Was in his usual state of health until the day of presentation at the emergency room  when patient developed slurred speech.  The patient has been having intermittent slurred speech, dizziness, gait abnormalities for months according to report, but his slurred speech got worse today, was associated with a right-sided facial droop which  prompted him to go to ER.            Left frontal lobe acute small vessel infarcts, slurred speech/aphasia lasting several minutes each  over the past several months without associated focal weakness or numbness presenting with acute severe dysarthria and right facial droop. Was found to have atrial fib . 2 D Echo with normal EF. Cont. Aspirin, eliquis and lipitor.    -Occluded small segment of left MCA not amenable to interventions   -Follow up with dr.Donaldson in 1 month   ??   .   Atrial Fib with irreversible bradycardia   -S/p pacemaker placement this admission --on keflex prophylaxis till  12/28/2017.   -On eliquis   -TSH high t3/4 normal.Repeat in 6 weeks    -Follow up with dr.Ngo and wound check on 01/01/2018.   ??   ??   .   Thrombocytopenia. ??The etiology is not clear at this time. ??   -Platelets stable now at 88k   -follow up with hem/onc - Dr.evers         .    Osteoarthritis. Stable.    ??    Body mass index is 23.86 kg/m??.   ??   Mri Brain Wo Cont      Result Date: 12/21/2017   IMPRESSION: Small acute cortical infarction left MCA M2 anterior division segment. Mild paranasal sinus disease. Greater on the left. Mild chronic microvascular ischemic change with additional scattered remote lacunar infarctions in the cerebral white  matter. Mild cerebral atrophy. Correlates with CTA findings of occluded small left MCA territory segmental branch.       Ct Perf W Cbf      Result Date: 12/20/2017  Impression: Narrowing and occlusion of a small left middle cerebral artery M2/M3 segmental branch within the sulcus extending posteriorly with correlated decreased perfusion. No increased blood volume to suggest core infarct. Please refer to above findings  for complete details.       [place] Abd Comp      Result Date: 12/21/2017   IMPRESSION:  Lead appears normal size with incidental note of hepatic cyst and contracted gallbladder.Karie Georges Chest Port      Result Date: 12/22/2017   IMPRESSION: Left-sided pacemaker appears to be in satisfactory position. No pneumothorax.      Cta Code Neuro Head And Neck W Cont      Result Date: 12/20/2017   Impression: Narrowing and occlusion of a small left middle cerebral artery M2/M3 segmental branch within the sulcus extending posteriorly with correlated decreased perfusion. No increased blood volume to suggest core infarct. Please refer to above findings  for complete details.       Ct Code Neuro Head Wo Contrast      Result Date: 12/19/2017   IMPRESSION: Mild white matter disease likely to chronic small vessel ischemic disease. No evidence of acute intracranial process.                             PENDING TEST RESULTS:    At the time of discharge the following test results are still pending:none      FOLLOW UP APPOINTMENTS:       Follow-up Information               Follow up With  Specialties  Details  Why  Contact Info              [name], MD  Cardiology  On 12/29/2017  8:00 AM, please arrive at 7:45 AM  [address]   [address]   [place] [address]   [phone]                 Harlen Labs, MD  Hematology and Oncology, Hematology, Oncology  In 2 weeks    [address] [place] [address]   [phone]          Thurston Pounds, MD  Cardiology      [address]   [address]   [place] [address]   [phone]                 Lillia Corporal, DO  Internal Medicine  On 01/12/2018  Hospital f/u new PCP appointment Tuesday, 01/12/18 @ 11:15 a.m.  Please arrive 15 minutes early with photo ID, insurance card and a  listing of all medications.   [address]   [address]   [place] [address]   [phone]                       ADDITIONAL CARE RECOMMENDATIONS:     1)follow up with all the doctors appointments         DIET: Regular Diet            ACTIVITY: Activity as tolerated      WOUND CARE: none      EQUIPMENT needed: none         DISCHARGE MEDICATIONS:     Discharge Medication List as of 12/24/2017 12:14 PM  START taking these medications          Details        acetaminophen (TYLENOL) 325 mg tablet  Take 2 Tabs by mouth every six (6) hours as needed., OTC               apixaban (ELIQUIS) 5 mg tablet  Take 1 Tab by mouth two (2) times a day., No Print, Disp-1 Tab, R-0               atorvastatin (LIPITOR) 40 mg tablet  Take 1 Tab by mouth nightly., No Print, Disp-1 Tab, R-0               cephALEXin (KEFLEX) 250 mg capsule  Take 1 Cap by mouth three (3) times daily for 4 days., No Print, Disp-12 Cap, R-0                     STOP taking these medications                  HYDROcodone-acetaminophen (NORCO) 5-325 mg per tablet  Comments:    Reason for Stopping:                                 NOTIFY YOUR PHYSICIAN FOR ANY OF THE FOLLOWING:    Fever over 101 degrees for 24 hours.    Chest pain, shortness of breath, fever, chills, nausea, vomiting, diarrhea, change in mentation, falling, weakness, bleeding. Severe pain or pain not relieved by medications.   Or, any other signs or symptoms that you may have questions about.      DISPOSITION:         Home With:     OT    PT    HH    RN                   Long term SNF/Inpatient  Rehab       Independent/assisted living          Hospice          Other:           PATIENT CONDITION AT DISCHARGE:       Functional status         Poor        Deconditioned           Independent         Cognition          Lucid        Forgetful           Dementia         Catheters/lines (plus indication)         Foley        PICC        PEG           None         Code status          Full code            DNR         PHYSICAL EXAMINATION AT DISCHARGE:      Gen: appears comfortable   REsp:CTA b/l   Chest: dressing on the chest at the pace maker insertion with slight serosanguinous soaking which is old   Cardiac: irregular rythym,nomal S1,S2   ZOX:WRUEAbd:soft and non tender   Neuro: was able  to move all the extremities,alert and oriented X3   Muck: no LE edema         CHRONIC MEDICAL DIAGNOSES:      Problem List as of 12/24/2017  Date Reviewed:  12/24/17                        Codes  Class  Noted - Resolved             Tachycardia-bradycardia syndrome (HCC)  ICD-10-CM: I49.5   ICD-9-CM: 427.81    12/21/2017 - Present                       Paroxysmal atrial fibrillation (HCC)  ICD-10-CM: I48.0   ICD-9-CM: 427.31    12/21/2017 - Present                       Pacemaker  ICD-10-CM: Z95.0   ICD-9-CM: V45.01    12/21/2017 - Present          Overview Signed 12/21/2017  6:34 PM by Thurston Pounds, MD            12/22/2017 St Jude dual chamber                                   * (Principal) Acute CVA (cerebrovascular accident) [facility])  ICD-10-CM: I63.9   ICD-9-CM: 434.91    Dec 24, 2017 - Present                           Greater than 40 minutes were spent with the patient on counseling and coordination of care      Signed:    Madelaine Bhat, MD   12/25/2017   11:04 PM

## 2017-12-24 NOTE — Progress Notes (Addendum)
CM called and spoke with Mendel Ryder, Liaison with [facility] stated she came and visited with the patient last night, and also spoke to the patient's nephew- per Mendel Ryder, the patient's nephew told her that he would provide support for the patient upon discharge from acute rehab. Mendel Ryder stated they can accept this patient later today (after 3:00 p.m.). CM will page attending MD, also will speak with therapy to determine best mode of transport for the patient. Theodosia Quay, MSW    10:17 a.m.- CM spoke with PT- PT stated patient can transport via family car. CM will call family members to confirm time of transport. Theodosia Quay, MSW    10:43 a.m.- CM met with patient, sister-in-law/MPOA Marnee Spring, and nephew at bedside to discuss disposition. Patient and family are agreeable for discharge to [facility] acute rehab today- family stated they can transport this afternoon, therapy stated that was appropriate. The CM will complete CM portion of EMTALA- accepting physician is Dr. Maryjo Rochester. CM paged attending MD. The CM put envelop on chart for RN to call report [phone]).     CM called and spoke to [place]ia with [facility]- family can transport at 3:00 p.m. Today, Mendel Ryder is aware of estimated time of arrival. CM provided family with nurses' station number for the family to call when they arrive at [facility] for them to meet the patient with a wheelchair. CM will fax discharge summary once available. Theodosia Quay, MSW    11:46 a.m.- CM faxed discharge summary to facility. Theodosia Quay, MSW    CM spoke with Mendel Ryder with [facility]- confirmed receipt of discharge summary. Theodosia Quay, MSW    13:50 p.m.- Patient does not have PCP, patient and family interested to see if they can get new PCP appointment for after rehab- CM sent request to Care Management specialist to assist to see if we can get an appointment for after rehab (family aware that CM does not know discharge  date from acute rehab, but that way patient can have something on the books and change if needed later). CM also provided list of [facility] at bedside to patient and nephew. Theodosia Quay, MSW

## 2017-12-24 NOTE — Progress Notes (Signed)
Patient listed as not having a primary care physician.  Hospital follow-up PCP transitional care appointment from rehab has been scheduled with Dr. Lillia CorporalIraj Mirshahi for Tuesday, 01/12/18 at 11:15 a.m.  Pending patient discharge.  Elio ForgetGladys Randolph, Care Management Specialist.

## 2017-12-24 NOTE — Progress Notes (Signed)
[facility]

## 2017-12-24 NOTE — Discharge Summary (Signed)
Discharge Summary       PATIENT ID: [name]  MRN: [id number]   DATE OF BIRTH: 1932/11/03    DATE OF ADMISSION: 12/19/2017  5:30 PM    DATE OF DISCHARGE: 12/24/2017   PRIMARY CARE PROVIDER: None     ATTENDING PHYSICIAN: Madelaine Bhat, MD    DISCHARGING PROVIDER: Madelaine Bhat, MD    To contact this individual call [phone] and ask the operator to page.  If unavailable ask to be transferred the Adult Hospitalist Department.    CONSULTATIONS: IP CONSULT TO NEUROLOGY  IP CONSULT TO CARDIOLOGY    PROCEDURES/SURGERIES: * No surgery found *    ADMITTING DIAGNOSES & HOSPITAL COURSE:     84 year old man with a past medical history significant for arthritis.  Was in his usual state of health until the day of presentation at the emergency room when patient developed slurred speech.  The patient has been having intermittent slurred speech, dizziness, gait abnormalities for months according to report, but his slurred speech got worse today, was associated with a right-sided facial droop which prompted him to go to ER.        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 Was found to have atrial fib . 2 D Echo with normal EF. Cont. Aspirin, eliquis and lipitor.   -Occluded small segment of left MCA not amenable to interventions  -Follow up with dr.Donaldson in 1 month  ??  .   Atrial Fib with irreversible bradycardia  -S/p pacemaker placement this admission --on keflex prophylaxis till 12/28/2017.  -On eliquis  -TSH high t3/4 normal.Repeat in 6 weeks   -Follow up with dr.Ngo and wound check on 01/01/2018.  ??  ??  .   Thrombocytopenia. ??The etiology is not clear at this time. ??  -Platelets stable now at 88k  -follow up with hem/onc - Dr.evers      .    Osteoarthritis. Stable.   ??   Body mass index is 23.86 kg/m??.  ??  Mri Brain Wo Cont     Result Date: 12/21/2017  IMPRESSION: Small acute cortical infarction left MCA M2 anterior division segment. Mild paranasal sinus disease. Greater on the left. Mild chronic microvascular ischemic change with additional scattered remote lacunar infarctions in the cerebral white matter. Mild cerebral atrophy. Correlates with CTA findings of occluded small left MCA territory segmental branch.     Ct Perf W Cbf    Result Date: 12/20/2017  Impression: Narrowing and occlusion of a small left middle cerebral artery M2/M3 segmental branch within the sulcus extending posteriorly with correlated decreased perfusion. No increased blood volume to suggest core infarct. Please refer to above findings for complete details.     [place] Abd Comp    Result Date: 12/21/2017  IMPRESSION:  Lead appears normal size with incidental note of hepatic cyst and contracted gallbladder.Karie Georges Chest Port    Result Date: 12/22/2017  IMPRESSION: Left-sided pacemaker appears to be in satisfactory position. No pneumothorax.    Cta Code Neuro Head And Neck W Cont    Result Date: 12/20/2017  Impression: Narrowing and occlusion of a small left middle cerebral artery M2/M3 segmental branch within the sulcus extending posteriorly with correlated decreased perfusion. No increased blood volume to suggest core infarct. Please refer to above findings for complete details.  Ct Code Neuro Head Wo Contrast    Result Date: 12/19/2017  IMPRESSION: Mild white matter disease likely to chronic small vessel ischemic disease. No evidence of acute intracranial process.                   PENDING TEST RESULTS:   At the time of discharge the following test results are still pending:none    FOLLOW UP APPOINTMENTS:    Follow-up Information     Follow up With Specialties Details Why Contact Info    [name], MD Cardiology On 12/29/2017 8:00 AM, please arrive at 7:45 AM [address]  [address]  [place] [address]  [phone]       Harlen Labs, MD Hematology and Oncology, Hematology, Oncology In 2 weeks  [address] [place] [address]  [phone]      Thurston Pounds, MD Cardiology   [address]  [address]  [place] [address]  [phone]      Lillia Corporal, DO Internal Medicine On 01/12/2018 Hospital f/u new PCP appointment Tuesday, 01/12/18 @ 11:15 a.m.  Please arrive 15 minutes early with photo ID, insurance card and a listing of all medications.  [address]  [address]  [place] [address]  [phone]             ADDITIONAL CARE RECOMMENDATIONS:   1)follow up with all the doctors appointments      DIET: Regular Diet        ACTIVITY: Activity as tolerated    WOUND CARE: none    EQUIPMENT needed: none      DISCHARGE MEDICATIONS:  Discharge Medication List as of 12/24/2017 12:14 PM      START taking these medications    Details   acetaminophen (TYLENOL) 325 mg tablet Take 2 Tabs by mouth every six (6) hours as needed., OTC      apixaban (ELIQUIS) 5 mg tablet Take 1 Tab by mouth two (2) times a day., No Print, Disp-1 Tab, R-0      atorvastatin (LIPITOR) 40 mg tablet Take 1 Tab by mouth nightly., No Print, Disp-1 Tab, R-0      cephALEXin (KEFLEX) 250 mg capsule Take 1 Cap by mouth three (3) times daily for 4 days., No Print, Disp-12 Cap, R-0         STOP taking these medications       HYDROcodone-acetaminophen (NORCO) 5-325 mg per tablet Comments:   Reason for Stopping:                 NOTIFY YOUR PHYSICIAN FOR ANY OF THE FOLLOWING:   Fever over 101 degrees for 24 hours.   Chest pain, shortness of breath, fever, chills, nausea, vomiting, diarrhea, change in mentation, falling, weakness, bleeding. Severe pain or pain not relieved by medications.  Or, any other signs or symptoms that you may have questions about.    DISPOSITION:    Home With:   OT  PT  HH  RN       Long term SNF/Inpatient Rehab    Independent/assisted living    Hospice    Other:       PATIENT CONDITION AT DISCHARGE:     Functional status    Poor      Deconditioned     Independent      Cognition     Lucid     Forgetful     Dementia  Catheters/lines (plus indication)    Foley     PICC     PEG     None      Code status     Full code     DNR      PHYSICAL EXAMINATION AT DISCHARGE:    Gen: appears comfortable  REsp:CTA b/l  Chest: dressing on the chest at the pace maker insertion with slight serosanguinous soaking which is old  Cardiac: irregular rythym,nomal S1,S2  QMV:HQIOAbd:soft and non tender  Neuro: was able to move all the extremities,alert and oriented X3  Muck: no LE edema      CHRONIC MEDICAL DIAGNOSES:  Problem List as of 12/24/2017 Date Reviewed: 12/19/2017          Codes Class Noted - Resolved    Tachycardia-bradycardia syndrome (HCC) ICD-10-CM: I49.5  ICD-9-CM: 427.81  12/21/2017 - Present        Paroxysmal atrial fibrillation (HCC) ICD-10-CM: I48.0  ICD-9-CM: 427.31  12/21/2017 - Present        Pacemaker ICD-10-CM: Z95.0  ICD-9-CM: V45.01  12/21/2017 - Present    Overview Signed 12/21/2017  6:34 PM by Thurston PoundsNgo, Matthew, MD     12/22/2017 St Jude dual chamber             * (Principal) Acute CVA (cerebrovascular accident) [facility](HCC) ICD-10-CM: I63.9  ICD-9-CM: 434.91  12/19/2017 - Present              Greater than 40 minutes were spent with the patient on counseling and coordination of care    Signed:   Madelaine BhatAnusha Elam Ellis, MD  12/25/2017  11:04 PM

## 2017-12-24 NOTE — Progress Notes (Signed)
A Spiritual Care Partner Volunteer visited patient in Rm [id number] on 12/24/2017.  Documented by:  Clancy GourdLarry Johnson, Chaplain, MDiv, MS, BCC  [phone] [phone]

## 2017-12-24 NOTE — Progress Notes (Signed)
Problem: Falls - Risk of  Goal: *Absence of Falls  Document Schmid Fall Risk and appropriate interventions in the flowsheet.  Outcome: Progressing Towards Goal  Fall Risk Interventions:  Mobility Interventions: Patient to call before getting OOB    Mentation Interventions: Door open when patient unattended, Bed/chair exit alarm    Medication Interventions: Patient to call before getting OOB    Elimination Interventions: Bed/chair exit alarm, Call light in reach

## 2017-12-24 NOTE — Progress Notes (Signed)
Stroke Education documented in Patient Education: YES  Core Measures Documented in Connect Care:  Risk Factors: YES  Warning signs of stroke: YES  When to Activate 911: YES  Medication Education for Risk Factors: YES  Smoking cessation if applicable: YES  Written Education Given:  YES    Discharge NIH Completed: YES  Score: 0    BRAINS: YES    Follow Up Appointment Made: NO  Date/Time if applicable: N/ABedside RN performed patient education and medication education. Discharge concerns initiated and discussed with patient, including clarification on "who" assists the patient at their home and instructions for when the home going patient should call their provider after discharge. Opportunity for questions and clarification was provided.      Patient receptive to education: YES  Patient stated: "I'm glad I know signs and symptoms of stroke"  Barriers to Education: None  Diagnosis Education given:  YES    Length of stay: 5  Expected Day of Discharge: Lipitor  Ask if they have "Help at Home" & add to white board?  YES    Education Day #: 1    Medication Education Given:  YES  M in the box Medication name: Lipitor    Pt aware of HCAHPS survey: NO

## 2017-12-24 NOTE — Progress Notes (Signed)
Problem: Mobility Impaired (Adult and Pediatric)  Goal: *Acute Goals and Plan of Care (Insert Text)  Physical Therapy Goals  Initiated 12/21/2017  1.  Patient will move from supine to sit and sit to supine  in bed with modified independence within 7 day(s).    2.  Patient will transfer from bed to chair and chair to bed with modified independence using the least restrictive device within 7 day(s).  3.  Patient will perform sit to stand with modified independence within 7 day(s).  4.  Patient will ambulate with modified independence for 200 feet with the least restrictive device within 7 day(s).   5.  Patient will ascend/descend 12 stairs with single handrail(s) with minimal assistance/contact guard assist within 7 day(s).  6.  Patient will improve Berg Balance score by 7 points within 7 days.     physical Therapy TREATMENT  Patient: [name] (16(84 y.o. male)  Date: 12/24/2017  Diagnosis: Acute CVA (cerebrovascular accident) [facility](HCC) Acute CVA (cerebrovascular accident) [facility](HCC)      Precautions: Fall  Chart, physical therapy assessment, plan of care and goals were reviewed.    ASSESSMENT:  Patient making progress towards goals with improved endurance today but diminished right sided awareness, path deviations, and poor recall of information. Gait training x100' total with min-CGA and cues given to avoid a counter and doorway on his right side. No overt difficulty when asked to identify objects or his room to the right. He indicated no recall of being out OOB since admission despite working with therapy yesterday. This patient was independent without DME and living alone prior to admission. He is significantly below baseline at present and highly recommend discharge to an IP rehab setting when medically stable.  Progression toward goals:  [x]     Improving appropriately and progressing toward goals  []     Improving slowly and progressing toward goals  []     Not making progress toward goals and plan of care will be adjusted      PLAN:  Patient continues to benefit from skilled intervention to address the above impairments.  Continue treatment per established plan of care.  Discharge Recommendations:  Inpatient Rehab  Further Equipment Recommendations for Discharge:  to be determined       SUBJECTIVE:   Patient stated ???It feels good. I haven't been out of bed since I have been here.???    OBJECTIVE DATA SUMMARY:   Critical Behavior:  Neurologic State: Alert  Orientation Level: Oriented X4  Cognition: Appropriate for age attention/concentration, Follows commands  Safety/Judgement: Decreased insight into deficits, Decreased awareness of need for assistance  Functional Mobility Training:  Bed Mobility:                    Transfers:  Sit to Stand: Minimum assistance  Stand to Sit: Minimum assistance        Bed to Chair: Minimum assistance                    Balance:  Sitting: Intact  Standing: Impaired;With support  Standing - Static: Constant support  Standing - Dynamic : FairAmbulation/Gait Training:  Distance (ft): 100 Feet (ft)  Assistive Device: Gait belt  Ambulation - Level of Assistance: Minimal assistance        Gait Abnormalities: Path deviations;Trunk sway increased;Shuffling gait        Base of Support: Narrowed     Speed/Cadence: Pace decreased (<100 feet/min);Slow  Step Length: Left shortened;Right shortened  Pain:  Pain Scale 1: Numeric (0 - 10)  Pain Intensity 1: 0              Activity Tolerance:     Please refer to the flowsheet for vital signs taken during this treatment.  After treatment:   [x]     Patient left in no apparent distress sitting up in chair  []     Patient left in no apparent distress in bed  [x]     Call bell left within reach  [x]     Nursing notified  []     Caregiver present  [x]     Bed alarm activated    COMMUNICATION/COLLABORATION:   The patient???s plan of care was discussed with: Registered Nurse    Cathie Olden, PT, DPT   Time Calculation: 24 mins

## 2017-12-28 LAB — IMMUNOELECTROPHORESIS (IMMUNOFIX.)
Immunoglobulin A, Qt.: 288 mg/dL (ref 61–437)
Immunoglobulin G, Qt.: 960 mg/dL (ref 700–1600)
Immunoglobulin M, Qt.: 30 mg/dL (ref 15–143)

## 2017-12-29 ENCOUNTER — Encounter: Attending: Internal Medicine | Primary: Family

## 2018-01-01 ENCOUNTER — Encounter: Primary: Family

## 2018-01-01 NOTE — Telephone Encounter (Signed)
Missed wound check appointment with nurse today.  Attempted to reach patient by telephone. A message was left for return call.

## 2018-01-11 NOTE — Telephone Encounter (Signed)
Left another message for patient to contact to reschedule missed appt.

## 2018-01-12 ENCOUNTER — Encounter: Attending: Internal Medicine | Primary: Family

## 2018-01-13 NOTE — Telephone Encounter (Addendum)
Alyson LocketMary Ann Lust called to schedule a hospital fu appointment following patient recent hospital visit.  [facility]((Hospital inpatient : 12-19-17(Discharged ~ 12-24-17))  Phone # [phone]  Thanks

## 2018-01-13 NOTE — Telephone Encounter (Signed)
Verified patient with two types of identifiers. Notified Corrie DandyMary that we can see him on 01/15/18 at 10:45am for a wound check .  She states that she is not sure why he missed his 01/01/18 wound check.

## 2018-01-15 ENCOUNTER — Ambulatory Visit: Admit: 2018-01-15 | Discharge: 2018-01-15 | Payer: MEDICARE | Primary: Family

## 2018-01-15 ENCOUNTER — Institutional Professional Consult (permissible substitution): Admit: 2018-01-15 | Discharge: 2018-01-15 | Payer: MEDICARE | Primary: Family

## 2018-01-15 DIAGNOSIS — I480 Paroxysmal atrial fibrillation: Secondary | ICD-10-CM

## 2018-01-15 DIAGNOSIS — Z950 Presence of cardiac pacemaker: Secondary | ICD-10-CM

## 2018-01-15 NOTE — Progress Notes (Signed)
Cardiac Electrophysiology Wound Check Note      Wound Check   Patient is here for wound check from pacemaker.  No fever, drainage   Physical Exam   Constitutional: well-developed and well-nourished.   Skin: Left side pocket is healing without redness, drainage, hematoma. The wound is intact and edges approximated.  Dressing and steri strips removed.       ASSESSMENT and PLAN   The incision is healing without redness, drainage, hematoma. Site intact.   The patient has finished their anti-biotic and been compliant with arm restrictions.   They will continue arms restrictions for 1 more week.  current treatment plan is effective, no change in therapy   Device check shows proper lead and generator functions    Follow-up Disposition:  Return 3 months I will check via device clinic or remote monitoring in the future

## 2018-01-15 NOTE — Progress Notes (Signed)
See scanned Pacemaker Report in Chart Review.  Chargeable visit.

## 2018-01-26 ENCOUNTER — Ambulatory Visit: Admit: 2018-01-26 | Discharge: 2018-01-26 | Payer: MEDICARE | Attending: Family | Primary: Family

## 2018-01-26 ENCOUNTER — Inpatient Hospital Stay: Admit: 2018-03-23 | Payer: MEDICARE | Primary: Family

## 2018-01-26 DIAGNOSIS — E781 Pure hyperglyceridemia: Secondary | ICD-10-CM

## 2018-01-26 DIAGNOSIS — R7989 Other specified abnormal findings of blood chemistry: Secondary | ICD-10-CM

## 2018-01-26 MED ORDER — QUETIAPINE 25 MG TAB
25 mg | ORAL_TABLET | Freq: Every evening | ORAL | 3 refills | Status: DC
Start: 2018-01-26 — End: 2018-04-06

## 2018-01-26 MED ORDER — ATORVASTATIN 20 MG TAB
20 mg | ORAL_TABLET | Freq: Every day | ORAL | 3 refills | Status: DC
Start: 2018-01-26 — End: 2018-04-06

## 2018-01-26 MED ORDER — FAMOTIDINE 20 MG TAB
20 mg | ORAL_TABLET | Freq: Two times a day (BID) | ORAL | 3 refills | Status: DC
Start: 2018-01-26 — End: 2018-04-06

## 2018-01-26 MED ORDER — APIXABAN 5 MG TABLET
5 mg | ORAL_TABLET | Freq: Two times a day (BID) | ORAL | 3 refills | Status: DC
Start: 2018-01-26 — End: 2018-04-06

## 2018-01-26 NOTE — Progress Notes (Signed)
HISTORY OF PRESENT ILLNESS  [name] is a 84 y.o. male.  HPI: New patient following up from ER to get established and has history chronic hip pain due to arthritis. Patient went to [facility]  for intermittent slurred speech,dizziness and gait abnormalities that he had for months. His slurred speech got worse on 12/19/17 and was associated with right sided facial droop which prompted him to go to ER. His CTA showed small acute cortical infarction left MCA, M2 anterior division segment. Mild paranasal sinus diease. Greater on the left  Mild cerebral atrophy, mild chronic micro vascular changes with additional scattered remote lacunar infarction in the cerebral white matter.   Patient had A fib and labs showed thrombocytopenia with unknown ethology. Patient referred to Dr Adline Mango and he was give pacemaker due to tachycardia syndrome and started patient on Lipitor.   He was also referred to hematologist. For low platelets.   Past Medical History:   Diagnosis Date   ??? Arrhythmia    ??? Arthritis    ??? Chronic pain     RIGHT HIP   ??? Insomnia    ??? Stroke [facility])      Past Surgical History:   Procedure Laterality Date   ??? ABDOMEN SURGERY PROC UNLISTED     ??? HX PACEMAKER     No Known Allergies    Current Outpatient Medications:   ???  QUEtiapine (SEROQUEL) 25 mg tablet, Take 1 Tab by mouth nightly. Takes 1/2 tab by mouth at bedtime., Disp: 30 Tab, Rfl: 3  ???  atorvastatin (LIPITOR) 20 mg tablet, Take 1 Tab by mouth daily., Disp: 30 Tab, Rfl: 3  ???  apixaban (ELIQUIS) 5 mg tablet, Take 1 Tab by mouth two (2) times a day., Disp: 60 Tab, Rfl: 3  ???  famotidine (PEPCID) 20 mg tablet, Take 1 Tab by mouth two (2) times a day., Disp: 60 Tab, Rfl: 3  ???  acetaminophen (TYLENOL) 325 mg tablet, Take 2 Tabs by mouth every six (6) hours as needed., Disp: , Rfl:   Review of Systems   Constitutional: Negative.    Respiratory: Negative.    Cardiovascular: Negative.    Gastrointestinal: Negative.    Neurological: Negative.     Blood pressure 134/58, pulse 66, temperature 97.8 ??F (36.6 ??C), temperature source Oral, resp. rate 16, height 6' (1.829 m), weight 177 lb 6.4 oz (80.5 kg), SpO2 100 %.    Physical Exam   Constitutional: He is oriented to person, place, and time. No distress.   HENT:   Mouth/Throat: Oropharynx is clear and moist.   Neck: Normal range of motion. Neck supple.   Cardiovascular: Normal rate and regular rhythm.   No murmur heard.  Pulmonary/Chest: Effort normal and breath sounds normal.   Abdominal: Soft. Bowel sounds are normal.   Musculoskeletal:   No weakness in left side   Neurological: He is alert and oriented to person, place, and time. No cranial nerve deficit. Coordination normal.   Nursing note and vitals reviewed.      ASSESSMENT and PLAN  Diagnoses and all orders for this visit:    1. Pure hyperglyceridemia  -     atorvastatin (LIPITOR) 20 mg tablet; Take 1 Tab by mouth daily.    2. Abnormal CBC  -     CBC W/O DIFF    3. Gastroesophageal reflux disease without esophagitis  -     famotidine (PEPCID) 20 mg tablet; Take 1 Tab by mouth two (2) times a  day.    4. Adjustment insomnia  -     QUEtiapine (SEROQUEL) 25 mg tablet; Take 1 Tab by mouth nightly. Takes 1/2 tab by mouth at bedtime.    5. Pacemaker  -     apixaban (ELIQUIS) 5 mg tablet; Take 1 Tab by mouth two (2) times a day.  Follow up in three months  Pt was given an after visit summary which includes diagnosis, current medicines and vital and voiced understanding of treatment plan

## 2018-01-26 NOTE — Progress Notes (Signed)
Chief Complaint   Patient presents with   ??? Establish Care     New patient , pt states that he just had a pacemaker put in by Dr. Loma NewtonNGO after his fall.   ??? Neurologic Problem     causing fall from 12/19/2017, wil like a referral to neurologist.     1. Have you been to the ER, urgent care clinic since your last visit?  Hospitalized since your last visit?[facility].    2. Have you seen or consulted any other health care providers outside of the [facility] since your last visit?  Include any pap smears or colon screening. No

## 2018-01-27 LAB — CBC W/O DIFF
HCT: 41.8 % (ref 37.5–51.0)
HGB: 13.9 g/dL (ref 13.0–17.7)
MCH: 33.4 pg — ABNORMAL HIGH (ref 26.6–33.0)
MCHC: 33.3 g/dL (ref 31.5–35.7)
MCV: 101 fL — ABNORMAL HIGH (ref 79–97)
PLATELET: 101 10*3/uL — ABNORMAL LOW (ref 150–379)
RBC: 4.16 x10E6/uL (ref 4.14–5.80)
RDW: 14.2 % (ref 12.3–15.4)
WBC: 7.3 10*3/uL (ref 3.4–10.8)

## 2018-01-29 NOTE — Telephone Encounter (Signed)
[name] informed he takes 1/2 at hs

## 2018-01-29 NOTE — Telephone Encounter (Signed)
Alona BeneJoyce from [facility], is stating that she needs clarification on the medication QUEtiapine (SEROQUEL) 25 mg tablet.     Best callback:[phone]        LOV:  Tuesday, January 26, 2018

## 2018-01-29 NOTE — Telephone Encounter (Signed)
Laurence SpatesSanderford, [place], NP  Fredrich Birkshomas, Rabab Currington A, LPN   Caller: Unspecified (Today, ??2:55 PM)      ??      I didn't stared on that, he was taking 1/2 tab at night from previous pcp

## 2018-02-02 ENCOUNTER — Ambulatory Visit: Admit: 2018-02-02 | Payer: MEDICARE | Attending: Neurology | Primary: Family

## 2018-02-02 DIAGNOSIS — I639 Cerebral infarction, unspecified: Secondary | ICD-10-CM

## 2018-02-02 NOTE — Telephone Encounter (Signed)
I was told pts POA would be coming with him. Unable to reach pt at this time.

## 2018-02-02 NOTE — Telephone Encounter (Signed)
Rcv'd call from KincaidArcher, PT with [facility].  She wants you to have the following information about this pt seeing you today.    Pt is very unsafe to live alone  Memory/cognative #s show moderate dementia  Family at [place]  [place]  Recent stroke and walks without assist device  Can't take meds on his own. (forgets, drops on floor)  Not medicated for dementia  Good at "covering" his forgetfulness  Not bathing and has been in same clothes for past 2 weeks.

## 2018-02-02 NOTE — Progress Notes (Signed)
Patient is here for a hospital follow up post stroke.    Patient states that he was very active before his stroke, but now has not been able to do that now. He is shaky and dizzy when he first gets up in the AM.    No pain reported at all.     Sister stated that she has noticed some memory issues (short term) and decision making issues.

## 2018-02-02 NOTE — Progress Notes (Signed)
Neurology Clinic Follow up Note    Patient ID:  [name]  [id number]  84 y.o.  08-Mar-1932      [name] is here for follow up today of stroke     Last Appointment With Me:  Hospital F/U stroke      Interval History:   Pt returns for f/u of aphasia/dysarthria/stroke.  Here for f/u of L MCA small territory infarct, L M2/3 occlusion not amenable to endovascular intervention. ??Mechanism likely cardioembolic in setting of newly diagnosed Afib. S/P PPM last hospitalization.    He admits to slightly slurred speech following his discharge after stroke, no new deficits/focal weakness/numbness/vision loss.  He remains on Eliquis for Afib/stroke prevention.  No reported side effects/hemorrhage.    His sister is here with him today and reports some concern regarding his memory.  He is currently living alone.  There have been some issues with medication administration and hygiene noted by home health aids.      PMHx/ PSHx/ FHx/ SHx:  Reviewed and unchanged previous visit.   Past Medical History:   Diagnosis Date   ??? Arrhythmia    ??? Arthritis    ??? Chronic pain     RIGHT HIP   ??? Insomnia    ??? Stroke (HCC)        ROS:  Comprehensive review of systems negative except for as noted above.       Objective:     Meds:  Current Outpatient Medications   Medication Sig Dispense Refill   ??? QUEtiapine (SEROQUEL) 25 mg tablet Take 1 Tab by mouth nightly. Takes 1/2 tab by mouth at bedtime. 30 Tab 3   ??? atorvastatin (LIPITOR) 20 mg tablet Take 1 Tab by mouth daily. 30 Tab 3   ??? apixaban (ELIQUIS) 5 mg tablet Take 1 Tab by mouth two (2) times a day. 60 Tab 3   ??? famotidine (PEPCID) 20 mg tablet Take 1 Tab by mouth two (2) times a day. 60 Tab 3   ??? acetaminophen (TYLENOL) 325 mg tablet Take 2 Tabs by mouth every six (6) hours as needed.         Exam:  Visit Vitals  BP 118/74   Pulse 60   Resp 20   Ht 6' (1.829 m)   Wt 80.3 kg (177 lb)   SpO2 98%   BMI 24.01 kg/m??     NEUROLOGICAL EXAM:  General: Awake, alert, speech fluent   CN: PERRL, EOMI without nystagmus, VFF to confrontation, facial sensation and strength are normal and symmetric, hearing is intact to finger rub bilaterally, palate and tongue movements are intact and symmetric.  Motor: Normal tone, bulk and strength bilaterally.  Reflexes: 1/4 and symmetric, plantar stimulation is flexor.  Coordination: FNF, RAM, HTS intact.  Sensation: LT intact throughout.  Gait: Slightly unsteady    LABS  Results for orders placed or performed in visit on 01/26/18   CBC W/O DIFF   Result Value Ref Range    WBC 7.3 3.4 - 10.8 x10E3/uL    RBC 4.16 4.14 - 5.80 x10E6/uL    HGB 13.9 13.0 - 17.7 g/dL    HCT 45.4 09.8 - 11.9 %    MCV 101 (H) 79 - 97 fL    MCH 33.4 (H) 26.6 - 33.0 pg    MCHC 33.3 31.5 - 35.7 g/dL    RDW 14.7 82.9 - 56.2 %    PLATELET 101 (L) 150 - 379 x10E3/uL  IMAGING:  MRI Results (most recent):  Results from Hospital Encounter encounter on 12/19/17   MRI BRAIN WO CONT    Narrative *PRELIMINARY REPORT*    Punctate foci of diffusion restriction in the left frontal lobe are compatible  with acute small vessel infarcts. No evidence of intracranial hemorrhage or  mass. Mild periventricular and subcortical white matter disease, compatible with  chronic small vessel ischemia.    Preliminary report was provided by Dr. Alessandra BevelsVaughn, the on-call radiologist, at 1:30  PM.    Final report to follow.    *END PRELIMINARY REPORT*    EXAM:  MRI BRAIN WO CONT  History: Dysarthria  INDICATION:  Dysarthria    COMPARISON: CTA 12/19/2017.    CONTRAST:  None.    TECHNIQUE: Sagittal T1, axial FLAIR, T2,T1 and gradient echo images as well as  coronal T2 weighted images and axial diffusion weighted images of the head were  obtained.    FINDINGS:      Small less than 5 mm foci of cortically based infarction in the left frontal  lobe. Anterior division MCA vascular territory. No other foci of infarction. No  Chiari or syrinx. Pituitary infundibulum unremarkable. Scattered foci of   increased T2 signal intensity corona radiata and centrum semiovale. Small remote  lacunar infarctions in the cerebral white matter on the right and on the left.  Mucoperiosteal thickening in the left maxillary sinus. Left frontal sinus  disease. Sphenoid sinus disease as well. Trace fluid in the vascular cells.  There is no evidence of mass, hemorrhage or abnormal extra-axial fluid  collection.  Normal appearing flow-voids are present in the vertebral, basilar  and carotid artery systems. The craniocervical junction is normal.     Impression IMPRESSION:     Small acute cortical infarction left MCA M2 anterior division segment.  Mild paranasal sinus disease. Greater on the left.  Mild chronic microvascular ischemic change with additional scattered remote  lacunar infarctions in the cerebral white matter. Mild cerebral atrophy.  Correlates with CTA findings of occluded small left MCA territory segmental  branch.                   Assessment:     Encounter Diagnoses     ICD-10-CM ICD-9-CM   1. Acute CVA (cerebrovascular accident) (HCC) I63.9 434.91   2. Paroxysmal atrial fibrillation (HCC) I48.0 427.31   3. Cognitive impairment R41.4389 62294.649   84 year old RHM here for hospital f/u.  Previously seen with dysarthria/aphasia.  CTA/CTP reviewed occlusion of the distal L M2/3 segment not amenable to endovascular intervention, decreased perfusion of the L frontal region. ??MRI Brain completed showing small foci of restricted diffusion L M2 division corresponding with territory of previously noted distal occlusion.  He is doing well post discharge from a stroke perspective without residual focal deficits.    A/P L MCA small territory infarct, L M2/3 occlusion not amenable to endovascular intervention. ??Mechanism likely cardioembolic in setting of newly diagnosed Afib.  On Eliquis and doing well post discharge.   Family/home health has raised concerns regarding cognitive impairment,  taking medications sporadically at home and inconsistency with daily hygiene.  We discussed this at today's visit and need for transition to an assisted living facility.  Contact information and community resources provided.    Plan:   Cont. Eliquis for stroke prevention  Cont. Statin therapy  Goal SBP<140, LDL<70   Advise transition to ALF due to cognitive impairment  He will need assistance with medications and finances.  POA is established.      Follow-up Disposition:  Return in about 6 months (around 08/02/2018).      Signed:  Hulan Saas, DO  02/02/2018

## 2018-02-04 NOTE — Telephone Encounter (Signed)
Alona BeneJoyce from [facility] is calling on behalf of the patient, Patient is being discharged on February 12, 2018. Alona BeneJoyce is calling wanting to see if the patient can get two more visits with the nurse before the pt is discharged.      Best callback:  [phone]      LOV:  Tuesday, January 26, 2018

## 2018-02-05 NOTE — Telephone Encounter (Signed)
Laurence SpatesSanderford, Soeria, NP  ??You 1 minute ago (10:17 AM)      Yes she can (Routing comment)

## 2018-02-05 NOTE — Telephone Encounter (Signed)
Alona BeneJoyce informed of ok to add extra visits

## 2018-02-09 NOTE — Telephone Encounter (Signed)
Noted.

## 2018-02-09 NOTE — Telephone Encounter (Signed)
Christell FaithArcher, physical therapist from [facility] calling, stated the pt has started to wander. He also fell twice over the weekend in public and stated  "strange people picked him up and brought him home". Christell Faithrcher would like a call back.

## 2018-02-09 NOTE — Telephone Encounter (Signed)
[name], Danny M, DO  Neisz, Nokomis S, LPN   Caller: Unspecified (Today, 10:59 AM)      ??      I have advised transition to higher level of care due to his cognition. ??Family/POA have been made aware of this at last visit. ??RMD

## 2018-02-09 NOTE — Telephone Encounter (Signed)
Hulan Saasonaldson, Rachel M, DO  Neisz, Nokomis S, LPN   Caller: Unspecified (Today, 10:59 AM)      ??      Spoke with POA. ??Family is attempting to arrange for home health aids to assist pt. ??RMD

## 2018-03-11 ENCOUNTER — Encounter: Attending: Neurology | Primary: Family

## 2018-04-06 ENCOUNTER — Ambulatory Visit: Admit: 2018-04-06 | Discharge: 2018-04-06 | Payer: MEDICARE | Attending: Family | Primary: Family

## 2018-04-06 ENCOUNTER — Inpatient Hospital Stay: Admit: 2018-05-27 | Payer: MEDICARE | Primary: Family

## 2018-04-06 DIAGNOSIS — Z0000 Encounter for general adult medical examination without abnormal findings: Secondary | ICD-10-CM

## 2018-04-06 DIAGNOSIS — E781 Pure hyperglyceridemia: Secondary | ICD-10-CM

## 2018-04-06 MED ORDER — PNEUMOCOCCAL 13-VAL CONJ VACCINE-DIP CRM (PF) 0.5 ML IM SYRINGE
0.5 mL | Freq: Once | INTRAMUSCULAR | 0 refills | Status: AC
Start: 2018-04-06 — End: 2018-04-06

## 2018-04-06 MED ORDER — VARICELLA-ZOSTER GLYCOE VACC-AS01B ADJ(PF) 50 MCG/0.5 ML IM SUSPENSION
50 mcg/0.5 mL | Freq: Once | INTRAMUSCULAR | 1 refills | Status: AC
Start: 2018-04-06 — End: 2018-04-06

## 2018-04-06 MED ORDER — FAMOTIDINE 20 MG TAB
20 mg | ORAL_TABLET | Freq: Two times a day (BID) | ORAL | 1 refills | Status: DC
Start: 2018-04-06 — End: 2018-11-15

## 2018-04-06 MED ORDER — QUETIAPINE 25 MG TAB
25 mg | ORAL_TABLET | Freq: Every evening | ORAL | 1 refills | Status: DC
Start: 2018-04-06 — End: 2018-10-02

## 2018-04-06 MED ORDER — APIXABAN 5 MG TABLET
5 mg | ORAL_TABLET | Freq: Two times a day (BID) | ORAL | 1 refills | Status: DC
Start: 2018-04-06 — End: 2018-10-29

## 2018-04-06 MED ORDER — ATORVASTATIN 20 MG TAB
20 mg | ORAL_TABLET | Freq: Every day | ORAL | 1 refills | Status: DC
Start: 2018-04-06 — End: 2019-01-05

## 2018-04-06 NOTE — Progress Notes (Signed)
HISTORY OF PRESENT ILLNESS  [name] is a 84 y.o. male.  HPI; Following up for medicare physical, patient has history stroke, arrhythmia/pacemaker, insomnia, hyperlipidemia. He is followed by cardiologist DR Nog and neurologist Dr Arlice Colt . He doesn't have any weakness in upper or lower ext, after stroke   Past Medical History:   Diagnosis Date   ??? Arrhythmia    ??? Arthritis    ??? Chronic pain     RIGHT HIP   ??? Insomnia    ??? Stroke [facility])      Past Surgical History:   Procedure Laterality Date   ??? ABDOMEN SURGERY PROC UNLISTED     ??? HX PACEMAKER     No Known Allergies    Current Outpatient Medications:   ???  QUEtiapine (SEROQUEL) 25 mg tablet, Take 1 Tab by mouth nightly. Takes 1/2 tab by mouth at bedtime., Disp: 45 Tab, Rfl: 1  ???  atorvastatin (LIPITOR) 20 mg tablet, Take 1 Tab by mouth daily., Disp: 90 Tab, Rfl: 1  ???  apixaban (ELIQUIS) 5 mg tablet, Take 1 Tab by mouth two (2) times a day., Disp: 180 Tab, Rfl: 1  ???  famotidine (PEPCID) 20 mg tablet, Take 1 Tab by mouth two (2) times a day., Disp: 180 Tab, Rfl: 1  ???  varicella-zoster recombinant, PF, (SHINGRIX, PF,) 50 mcg/0.5 mL susr injection, 0.5 mL by IntraMUSCular route once for 1 dose., Disp: 0.5 mL, Rfl: 1  ???  pneumococcal 13 val conj dip (PREVNAR-13) 0.5 mL syrg injection, 0.5 mL by IntraMUSCular route once for 1 dose., Disp: 0.5 mL, Rfl: 0  ???  acetaminophen (TYLENOL) 325 mg tablet, Take 2 Tabs by mouth every six (6) hours as needed., Disp: , Rfl:   Review of Systems   Constitutional: Negative.    Respiratory: Negative.    Cardiovascular: Negative.    Gastrointestinal: Negative.      Blood pressure 106/88, pulse 76, temperature 97.5 ??F (36.4 ??C), temperature source Oral, resp. rate 16, height 6' (1.829 m), weight 174 lb 6.4 oz (79.1 kg), SpO2 99 %.    Physical Exam   Constitutional: No distress.   HENT:   Mouth/Throat: Oropharynx is clear and moist.   Neck: Normal range of motion. Neck supple.   Cardiovascular: Normal rate and regular rhythm.    No murmur heard.  Pulmonary/Chest: Effort normal and breath sounds normal.   Abdominal: Soft. Bowel sounds are normal.   Nursing note and vitals reviewed.      ASSESSMENT and PLAN  Diagnoses and all orders for this visit:    1. Encounter for initial preventive physical examination covered by Medicare    2. Adjustment insomnia  -     QUEtiapine (SEROQUEL) 25 mg tablet; Take 1 Tab by mouth nightly. Takes 1/2 tab by mouth at bedtime.    3. Pure hyperglyceridemia  -     atorvastatin (LIPITOR) 20 mg tablet; Take 1 Tab by mouth daily.  -     LIPID PANEL  -     AMB POC COMPREHENSIVE METABOLIC PANEL    4. Pacemaker  -     apixaban (ELIQUIS) 5 mg tablet; Take 1 Tab by mouth two (2) times a day.    5. Gastroesophageal reflux disease without esophagitis  -     famotidine (PEPCID) 20 mg tablet; Take 1 Tab by mouth two (2) times a day.    6. Encounter for immunization  -     varicella-zoster recombinant, PF, (SHINGRIX,  PF,) 50 mcg/0.5 mL susr injection; 0.5 mL by IntraMUSCular route once for 1 dose.  -     TETANUS, DIPHTHERIA TOXOIDS AND ACELLULAR PERTUSSIS VACCINE (TDAP), IN INDIVIDS. >=7, IM    7. Screening PSA (prostate specific antigen)  -     PSA W/ REFLX FREE PSA    Other orders  -     pneumococcal 13 val conj dip (PREVNAR-13) 0.5 mL syrg injection; 0.5 mL by IntraMUSCular route once for 1 dose.  Pt was given an after visit summary which includes diagnosis, current medicines and vital and voiced understanding of treatment plan

## 2018-04-06 NOTE — Progress Notes (Signed)
Chief Complaint   Patient presents with   ??? Cholesterol Problem     Follow up.   ??? Insomnia     Follow up.     1. Have you been to the ER, urgent care clinic since your last visit?  Hospitalized since your last visit?No    2. Have you seen or consulted any other health care providers outside of [facility] since your last visit?  Include any pap smears or colon screening. Yes to DR. [name] SansMary Ann Sullivan for Basal cell removal on 03/31/2018 on right hand. Will be seeing her again 04/14/2018 and again 04/21/2018.

## 2018-04-06 NOTE — Progress Notes (Signed)
This is the Subsequent Medicare Annual Wellness Exam, performed 12 months or more after the Initial AWV or the last Subsequent AWV    I have reviewed the patient's medical history in detail and updated the computerized patient record.     History     Past Medical History:   Diagnosis Date   ??? Arrhythmia    ??? Arthritis    ??? Chronic pain     RIGHT HIP   ??? Insomnia    ??? Stroke [facility]HCC)       Past Surgical History:   Procedure Laterality Date   ??? ABDOMEN SURGERY PROC UNLISTED     ??? HX PACEMAKER       Current Outpatient Medications   Medication Sig Dispense Refill   ??? QUEtiapine (SEROQUEL) 25 mg tablet Take 1 Tab by mouth nightly. Takes 1/2 tab by mouth at bedtime. 45 Tab 1   ??? atorvastatin (LIPITOR) 20 mg tablet Take 1 Tab by mouth daily. 90 Tab 1   ??? apixaban (ELIQUIS) 5 mg tablet Take 1 Tab by mouth two (2) times a day. 180 Tab 1   ??? famotidine (PEPCID) 20 mg tablet Take 1 Tab by mouth two (2) times a day. 180 Tab 1   ??? varicella-zoster recombinant, PF, (SHINGRIX, PF,) 50 mcg/0.5 mL susr injection 0.5 mL by IntraMUSCular route once for 1 dose. 0.5 mL 1   ??? acetaminophen (TYLENOL) 325 mg tablet Take 2 Tabs by mouth every six (6) hours as needed.       No Known Allergies  Family History   Problem Relation Age of Onset   ??? Cancer Mother         BREAST CA   ??? Heart Disease Father    ??? Heart Disease Brother      Social History     Tobacco Use   ??? Smoking status: Never Smoker   ??? Smokeless tobacco: Never Used   Substance Use Topics   ??? Alcohol use: Yes     Comment: 2/WEEK VODKA     Patient Active Problem List   Diagnosis Code   ??? Acute CVA (cerebrovascular accident) (HCC) I63.9   ??? Tachycardia-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   ??? Paroxysmal atrial fibrillation (HCC) I48.0   ??? Pacemaker Z95.0       Depression Risk Factor Screening:     3 most recent PHQ Screens 04/06/2018   Little interest or pleasure in doing things Not at all   Feeling down, depressed, irritable, or hopeless Not at all   Total Score PHQ 2 0      Alcohol Risk Factor Screening:   You do not drink alcohol or very rarely.    Functional Ability and Level of Safety:   Hearing Loss  Hearing is good.    Activities of Daily Living  The home contains: no safety equipment.  Patient needs help with:  shopping, laundry, eating, bathroom needs and walking    Fall Risk  Fall Risk Assessment, last 12 mths 04/06/2018   Able to walk? Yes   Fall in past 12 months? No   Fall with injury? -   Number of falls in past 12 months -   Fall Risk Score -       Abuse Screen  Patient is not abused    Cognitive Screening   Evaluation of Cognitive Function:  Has your family/caregiver stated any concerns about your memory: no  Abnormal    Patient Care Team   Patient Care Team:  Shadee Rathod, Daleen Snook, NP as PCP - General (Family Practice)  Thurston Pounds, MD (Cardiology)    Assessment/Plan   Education and counseling provided:  Are appropriate based on today's review and evaluation    Diagnoses and all orders for this visit:    1. Encounter for initial preventive physical examination covered by Medicare    2. Adjustment insomnia  -     QUEtiapine (SEROQUEL) 25 mg tablet; Take 1 Tab by mouth nightly. Takes 1/2 tab by mouth at bedtime.    3. Pure hyperglyceridemia  -     atorvastatin (LIPITOR) 20 mg tablet; Take 1 Tab by mouth daily.  -     LIPID PANEL  -     AMB POC COMPREHENSIVE METABOLIC PANEL    4. Pacemaker  -     apixaban (ELIQUIS) 5 mg tablet; Take 1 Tab by mouth two (2) times a day.    5. Gastroesophageal reflux disease without esophagitis  -     famotidine (PEPCID) 20 mg tablet; Take 1 Tab by mouth two (2) times a day.    6. Encounter for immunization  -     varicella-zoster recombinant, PF, (SHINGRIX, PF,) 50 mcg/0.5 mL susr injection; 0.5 mL by IntraMUSCular route once for 1 dose.    7. Screening PSA (prostate specific antigen)  -     PSA W/ REFLX FREE PSA        Health Maintenance Due   Topic Date Due   ??? DTaP/Tdap/Td series (1 - Tdap) 03/31/1955   ??? GLAUCOMA SCREENING Q2Y  03/31/1999    ??? Pneumococcal 65+ years (1 of 2 - PCV13) 03/31/1999   ??? MEDICARE YEARLY EXAM  02/25/2017

## 2018-04-06 NOTE — Patient Instructions (Signed)
Medicare Wellness Visit, Male    The best way to live healthy is to have a lifestyle where you eat a well-balanced diet, exercise regularly, limit alcohol use, and quit all forms of tobacco/nicotine, if applicable.   Regular preventive services are another way to keep healthy. Preventive services (vaccines, screening tests, monitoring & exams) can help personalize your care plan, which helps you manage your own care. Screening tests can find health problems at the earliest stages, when they are easiest to treat.   [facility] follows the current, evidence-based guidelines published by the [place] [employer]ventive Services Task Force (USPSTF) when recommending preventive services for our patients. Because we follow these guidelines, sometimes recommendations change over time as research supports it. (For example, a prostate screening blood test is no longer routinely recommended for men with no symptoms.)  Of course, you and your doctor may decide to screen more often for some diseases, based on your risk and co-morbidities (chronic disease you are already diagnosed with).   Preventive services for you include:  - Medicare offers their members a free annual wellness visit, which is time for you and your primary care provider to discuss and plan for your preventive service needs. Take advantage of this benefit every year!  -All adults over age 82 should receive the recommended pneumonia vaccines. Current USPSTF guidelines recommend a series of two vaccines for the best pneumonia protection.   -All adults should have a flu vaccine yearly and an ECG. All adults age 82 and older should receive a shingles vaccine once in their lifetime.    -All adults age 840-70 who are overweight should have a diabetes screening test once every three years.   -Other screening tests & preventive services for persons with diabetes include: an eye exam to screen for diabetic retinopathy, a kidney function  test, a foot exam, and stricter control over your cholesterol.   -Cardiovascular screening for adults with routine risk involves an electrocardiogram (ECG) at intervals determined by the provider.   -Colorectal cancer screening should be done for adults age 82-75 with no increased risk factors for colorectal cancer.  There are a number of acceptable methods of screening for this type of cancer. Each test has its own benefits and drawbacks. Discuss with your provider what is most appropriate for you during your annual wellness visit. The different tests include: colonoscopy (considered the best screening method), a fecal occult blood test, a fecal DNA test, and sigmoidoscopy.  -All adults born between 1945 and 1965 should be screened once for Hepatitis C.  -An Abdominal Aortic Aneurysm (AAA) Screening is recommended for men age 82-75 who has ever smoked in their lifetime.     Here is a list of your current Health Maintenance items (your personalized list of preventive services) with a due date:  Health Maintenance Due   Topic Date Due   ??? DTaP/Tdap/Td  (1 - Tdap) 03/31/1955   ??? Glaucoma Screening   03/31/1999

## 2018-04-07 LAB — LIPID PANEL
Cholesterol, total: 120 mg/dL (ref 100–199)
HDL Cholesterol: 43 mg/dL (ref >39)
LDL, calculated: 65 mg/dL (ref 0–99)
Triglyceride: 60 mg/dL (ref 0–149)
VLDL, calculated: 12 mg/dL (ref 5–40)

## 2018-04-07 LAB — CVD REPORT

## 2018-04-07 LAB — PSA W/ REFLX FREE PSA: Prostate Specific Ag: 1.4 ng/mL (ref 0.0–4.0)

## 2018-04-20 ENCOUNTER — Institutional Professional Consult (permissible substitution): Primary: Family

## 2018-04-20 ENCOUNTER — Institutional Professional Consult (permissible substitution): Admit: 2018-04-20 | Payer: MEDICARE | Primary: Family

## 2018-04-20 ENCOUNTER — Encounter: Attending: Clinical Cardiac Electrophysiology | Primary: Family

## 2018-04-20 ENCOUNTER — Ambulatory Visit: Admit: 2018-04-20 | Payer: MEDICARE | Attending: Clinical Cardiac Electrophysiology | Primary: Family

## 2018-04-20 DIAGNOSIS — Z950 Presence of cardiac pacemaker: Secondary | ICD-10-CM

## 2018-04-20 NOTE — Progress Notes (Signed)
lexiscan cardiolite stress test 05/08/2018  Atrial fib occasional RV pacing   Frequent PVCs    No ischemia or infarct  LVEF 53%    Continue follow up    Future Appointments   Date Time Provider Department Center   05/24/2018 11:40 AM Hulan Saas, DO [facility] SCHED   05/27/2018 11:20 AM [name], MD [facility] SCHED   08/02/2018  9:30 AM Laurence Spates, NP [facility] SCHED   08/04/2018  1:45 PM REMOTE1, Billie Lade [facility] SCHED   10/27/2018 10:30 AM REMOTE1, REYNOLDS [facility] SCHED   02/09/2019  9:00 AM REMOTE1, [facility] SCH[facility]   05/03/2019  2:00 PM [email or web address], [facility]   05/03/2019  2:20 PM Thurston Pounds, MD [facility] SCHED

## 2018-04-20 NOTE — Patient Instructions (Signed)
You will be scheduled for a Nuclear Stress Test after your appointment today.    For Nuclear Stress Test:  Wear comfortable clothing (shorts or pants with a shirt or blouse- no underwire bras) and walking or athletic shoes.    DO NOT eat or drink anything, except water, for at least 2 hours prior to your appointment.     AVOID tobacco products for at least 6 hours prior to your test.    DO NOT eat or drink anything containing caffeine, including but not limited to the following: chocolate, regular and decaffeinated coffee, soft drinks, or tea for at least 12-24 hours prior to your test.    Do take your scheduled medications prior to your test.     You will need to inform our office if you are pregnant, nursing, or think you may be pregnant.    Your test will be performed on a 1 OR 2 day protocol. This is determined by your height, weight, and other risk factors. For a 2 day test, please allow for 2 hours in the office each day. For a 1 day test, please allow for 4 hours in the office that day.    The radioactive isotope used for your testing is different from any of the dyes that are commonly used in x-ray procedures, and is ordered specially for your test. Please call to cancel or reschedule your appointment at least 24 hours prior to your scheduled appointment to avoid being billed for the expensive isotope.

## 2018-04-20 NOTE — Progress Notes (Signed)
lexiscan cardiolite stress test 05/08/2018  Atrial fib occasional RV pacing   Frequent PVCs    No ischemia or infarct  LVEF 53%    Continue follow up    Future Appointments   Date Time Provider Department Center   05/24/2018 11:40 AM Donaldson, Rachel M, DO [facility] SCHED   05/27/2018 11:20 AM Browning, Christine M, MD [facility] SCHED   08/02/2018  9:30 AM Sanderford, Soeria, NP [facility] SCHED   08/04/2018  1:45 PM REMOTE1, [facility] SCHED   10/27/2018 10:30 AM REMOTE1, [facility] SCHED   02/09/2019  9:00 AM REMOTE1, [facility] SCHED   05/03/2019  2:00 PM [email or web address], [facility] SCHED   05/03/2019  2:20 PM Melvern Ramone, MD [facility] SCHED

## 2018-04-20 NOTE — Progress Notes (Signed)
Cardiac Electrophysiology OFFICE Note     Subjective:      [name] is a 84 y.o. patient who is seen for evaluation of pacemaker  He and his sister said he feels better with RV pacing 70% for LR 70 bpm  He has one NSVT   No syncope or dizziness  No chest pain    At [facility] in Jan 2019 he was seen for evaluation of atrial fibrillation & bradycardia with slow ventricular rate due to conduction disease  ??  He was admitted on 12/19/2017 after presenting to the ED with slurred speech.  Reported intermittent slurred speech, dizziness, & gait abnormalities for months, but acutely worsened on date of admission & was accompanied by right-sided facial droop.  Head CT showed possible acute infarction in posterior left frontal lobe. MRI brain showed acute small vessel infarcts.  ??  Also found to be in AF on admission.  He has started Eliquis 5 mg po bid for embolic CVA prophylaxis (CHADS2VASC 5).  No prior known history of AF.  Intermittent significant sinus bradycardia down to 30 bpm.    ??  TSH 8.7, T3 & T4 WNL. He had ventricular bigeminy & trigeminy on telemetry.  ??  Prior to admission, he reports intermittent lightheadedness/dizziness when standing quickly or exerting himself.  Denies chest pain, palpitations, SOB, PND, orthopnea, syncope, or edema.  ??  No family history of pacemakers.  ??  ??  Echo (12/21/2017): LVEF 65-70%, no RWMA.  RV mod dilated.  RA dilated.  Mild MR.  Mild to mod TR.  ??  MRI brain (12/19/2017): Punctate foci of diffusion restriction in left frontal lobe compatible with acute small vessel infarcts.  No evidence of intracranial hemorrhage or mass.  ??  CTA head & neck (12/19/2017): No evidence large vessel occlusion.  Small branch post MCA in sylvian fissure on series 2 image 429, gradually tapers to occlusion.  ??  Problem List  Date Reviewed: 2018/04/29          Codes Class Noted    Tachycardia-bradycardia syndrome [facility]) ICD-10-CM: I49.5  ICD-9-CM: 427.81  12/21/2017         Paroxysmal atrial fibrillation (HCC) ICD-10-CM: I48.0  ICD-9-CM: 427.31  12/21/2017        Pacemaker ICD-10-CM: Z95.0  ICD-9-CM: V45.01  12/21/2017    Overview Signed 12/21/2017  6:34 PM by Thurston Pounds, MD     12/22/2017 [employer] dual chamber             Acute CVA (cerebrovascular accident) [facility]) ICD-10-CM: I63.9  ICD-9-CM: 434.91  12/19/2017                  Current Outpatient Medications   Medication Sig Dispense Refill   ??? QUEtiapine (SEROQUEL) 25 mg tablet Take 1 Tab by mouth nightly. Takes 1/2 tab by mouth at bedtime. 45 Tab 1   ??? atorvastatin (LIPITOR) 20 mg tablet Take 1 Tab by mouth daily. 90 Tab 1   ??? apixaban (ELIQUIS) 5 mg tablet Take 1 Tab by mouth two (2) times a day. 180 Tab 1   ??? famotidine (PEPCID) 20 mg tablet Take 1 Tab by mouth two (2) times a day. 180 Tab 1   ??? acetaminophen (TYLENOL) 325 mg tablet Take 2 Tabs by mouth every six (6) hours as needed.       No Known Allergies  Past Medical History:   Diagnosis Date   ??? Arrhythmia    ??? Arthritis    ???  Chronic pain     RIGHT HIP   ??? Insomnia    ??? Stroke [facility])      Past Surgical History:   Procedure Laterality Date   ??? ABDOMEN SURGERY PROC UNLISTED     ??? HX PACEMAKER       Family History   Problem Relation Age of Onset   ??? Cancer Mother         BREAST CA   ??? Heart Disease Father    ??? Heart Disease Brother      Social History     Tobacco Use   ??? Smoking status: Never Smoker   ??? Smokeless tobacco: Never Used   Substance Use Topics   ??? Alcohol use: Yes     Comment: 2/WEEK VODKA        Review of Systems:   Constitutional: Negative for fever, chills, weight loss, malaise/fatigue.   HEENT: Negative for nosebleeds, vision changes.   Respiratory: Negative for cough, hemoptysis  Cardiovascular: Negative for chest pain, palpitations, orthopnea, claudication, leg swelling, syncope, and PND.   Gastrointestinal: Negative for nausea, vomiting, diarrhea, blood in stool and melena.   Genitourinary: Negative for dysuria, and hematuria.    Musculoskeletal: Negative for myalgias, arthralgia.   Skin: Negative for rash.   Heme: Does not bleed or bruise easily.   Neurological: Negative for speech change and focal weakness     Objective:     Visit Vitals  BP 120/76   Pulse 70   Ht 6' (1.829 m)   Wt 174 lb (78.9 kg)   BMI 23.60 kg/m??      Physical Exam:   Constitutional: thin. No respiratory distress.   Head: Normocephalic and atraumatic.   Eyes: Pupils are equal, round  ENT: hearing normal  Neck: supple. No JVD present.   Cardiovascular: Normal rate, regular rhythm. Exam reveals no gallop and no friction rub. No murmur heard.  Pulmonary/Chest: Effort normal and breath sounds normal. No wheezes.   Abdominal: Soft, no tenderness.   Musculoskeletal: no edema.   Neurological: alert,oriented.   Skin: Skin is warm and dry.  Left sided pacemaker pocket with lateral corner palpable but no erosion or redness or pain  Psychiatric: normal mood and affect. Behavior is normal. Judgment and thought content normal.        Assessment/Plan:       ICD-10-CM ICD-9-CM    1. Cardiac pacemaker in situ Z95.0 V45.01    2. Persistent atrial fibrillation (HCC) I48.1 427.31    3. Tachycardia-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 427.81    4. Hx of completed stroke Z86.73 V12.54    5. Chronic anticoagulation Z79.01 V58.61    6. NSVT (nonsustained ventricular tachycardia) (HCC) I47.2 427.1      reviewed diet, exercise and weight control   He has slow ventricular rate and RV pacing 70%  No CHF symptoms and signs, no angina   He had PVCs in hospital  He had RV dilation and NSVT, I recommend lexiscan nuclear stress test to assess RCA ischemia    Future Appointments   Date Time Provider Department Center   05/07/2018  8:00 AM NUCLEAR, [facility] SCHED   05/24/2018 11:40 AM Hulan Saas, DO [facility] SCHED   05/27/2018 11:20 AM [name], MD [facility] SCHED   08/02/2018  9:30 AM Laurence Spates, NP [facility] SCHED    08/04/2018  1:45 PM REMOTE1, [facility] SCHED   10/27/2018 10:30 AM REMOTE1, [facility] SCHED  02/09/2019  9:00 AM REMOTE1, [facility] SCHED   05/03/2019  2:00 PM PACEMAKER3, [facility] SCHED   05/03/2019  2:20 PM Thurston Pounds, MD [facility] SCHED         Thank you for involving me in this patient's care and please call with further concerns or questions.      Juliet Rude, M.D.  Electrophysiology/Cardiology  [facility]  [address]                      [address] [place], [place] [address]                             [place] [address]  [phone]                                        [phone]

## 2018-04-20 NOTE — Progress Notes (Signed)
No chief complaint on file.      Two patient indentifiers verified.   Patient medication verified.   Patient pharmacy was verified.

## 2018-04-20 NOTE — Progress Notes (Signed)
Scheduled pacemaker check scanned into cc.  Chargeable visit.

## 2018-05-07 ENCOUNTER — Ambulatory Visit

## 2018-05-07 ENCOUNTER — Encounter

## 2018-05-07 ENCOUNTER — Ambulatory Visit: Admit: 2018-05-07 | Discharge: 2018-05-07 | Payer: MEDICARE | Primary: Family

## 2018-05-07 DIAGNOSIS — R9431 Abnormal electrocardiogram [ECG] [EKG]: Secondary | ICD-10-CM

## 2018-05-07 MED ORDER — REGADENOSON 0.4 MG/5 ML IV SYRINGE
0.4 mg/5 mL | Freq: Once | INTRAVENOUS | Status: AC
Start: 2018-05-07 — End: 2018-05-07
  Administered 2018-05-07: 14:00:00 via INTRAVENOUS

## 2018-05-07 MED ORDER — TECHNETIUM TC-99M SESTAMIBI (CARDIOLITE) INJECTION WITH DILUTION KIT
Freq: Once | INTRAVENOUS | Status: AC
Start: 2018-05-07 — End: 2018-05-07
  Administered 2018-05-07: 14:00:00 via INTRAVENOUS

## 2018-05-07 MED ORDER — TECHNETIUM TC-99M SESTAMIBI (CARDIOLITE) INJECTION WITH DILUTION KIT
Freq: Once | INTRAVENOUS | Status: AC
Start: 2018-05-07 — End: 2018-05-07
  Administered 2018-05-07: 12:00:00 via INTRAVENOUS

## 2018-05-07 NOTE — Progress Notes (Signed)
Verified patient with two types of identifiers. Notified patient of results. Patient verbalized understanding and will call with any other questions.

## 2018-05-07 NOTE — Progress Notes (Signed)
Attempted to reach patient by telephone. A message was left for return call.

## 2018-05-07 NOTE — Progress Notes (Signed)
lexiscan cardiolite stress test 05/08/2018  Atrial fib occasional RV pacing   Frequent PVCs    No ischemia or infarct  LVEF 53%    Continue follow up

## 2018-05-07 NOTE — Progress Notes (Signed)
Attempted to reach patient by telephone. A message was left for return call. Also attempted patient on cell phone; however mailbox currently full.

## 2018-05-07 NOTE — Progress Notes (Signed)
lexiscan cardiolite stress test 05/08/2018  Atrial fib occasional RV pacing   Frequent PVCs    No ischemia or infarct  LVEF 53%    Continue follow up

## 2018-05-07 NOTE — Progress Notes (Signed)
Attempted to reach patient by telephone. A message was left for return call. Also attempted patient on cell phone; however mailbox currently full.

## 2018-05-11 LAB — NM STRESS TEST WITH MYOCARDIAL PERFUSION
Baseline Diastolic BP: 70 mmHg
Baseline HR: 72 {beats}/min
Baseline O2 Sat: 98 %
Baseline Systolic BP: 120 mmHg
Left Ventricular Ejection Fraction: 53
Stress Diastolic BP: 66 mmHg
Stress O2 Sat: 98 %
Stress Peak HR: 83 {beats}/min
Stress Percent HR Achieved: 61 %
Stress Rate Pressure Product: 9462 bpm*mmHg
Stress Systolic BP: 114 mmHg
Stress Target HR: 136 {beats}/min

## 2018-05-11 LAB — NUCLEAR CARDIAC STRESS TEST
Baseline Diastolic BP: 70 mmHg
Baseline HR: 72 {beats}/min
Baseline O2 Sat: 98 %
Baseline Systolic BP: 120 mmHg
Stress Diastolic BP: 66 mmHg
Stress O2 Sat: 98 %
Stress Peak HR: 83 {beats}/min
Stress Percent HR Achieved: 61 %
Stress Rate Pressure Product: 9462 bpm*mmHg
Stress Systolic BP: 114 mmHg
Stress Target HR: 136 {beats}/min

## 2018-05-17 NOTE — Telephone Encounter (Signed)
Pt is returning your call.  Phone #[phone]  Thanks

## 2018-05-17 NOTE — Telephone Encounter (Signed)
Please see Nuclear stress test results encounter.

## 2018-05-22 IMAGING — DX DG RIBS 2V*R*
3 series · 3 of 3 positions shown · non-contrast
Comparison: None.

CLINICAL DATA: Fall yesterday.  Rib pain.

EXAM:
RIGHT RIBS - 2 VIEW

[rib pa]
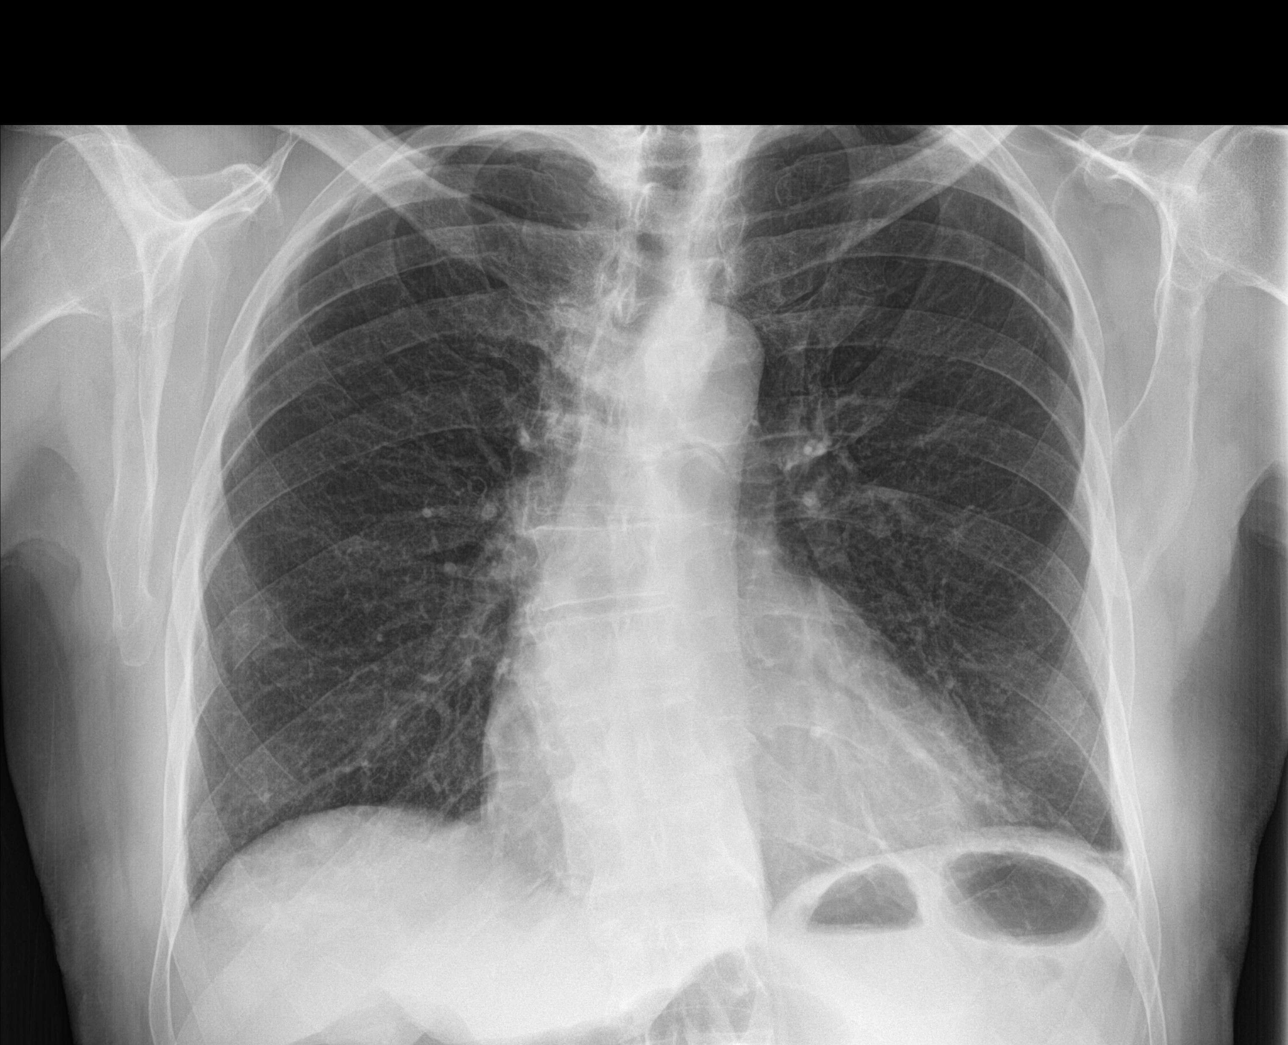

[rib obl (1 of 2)]
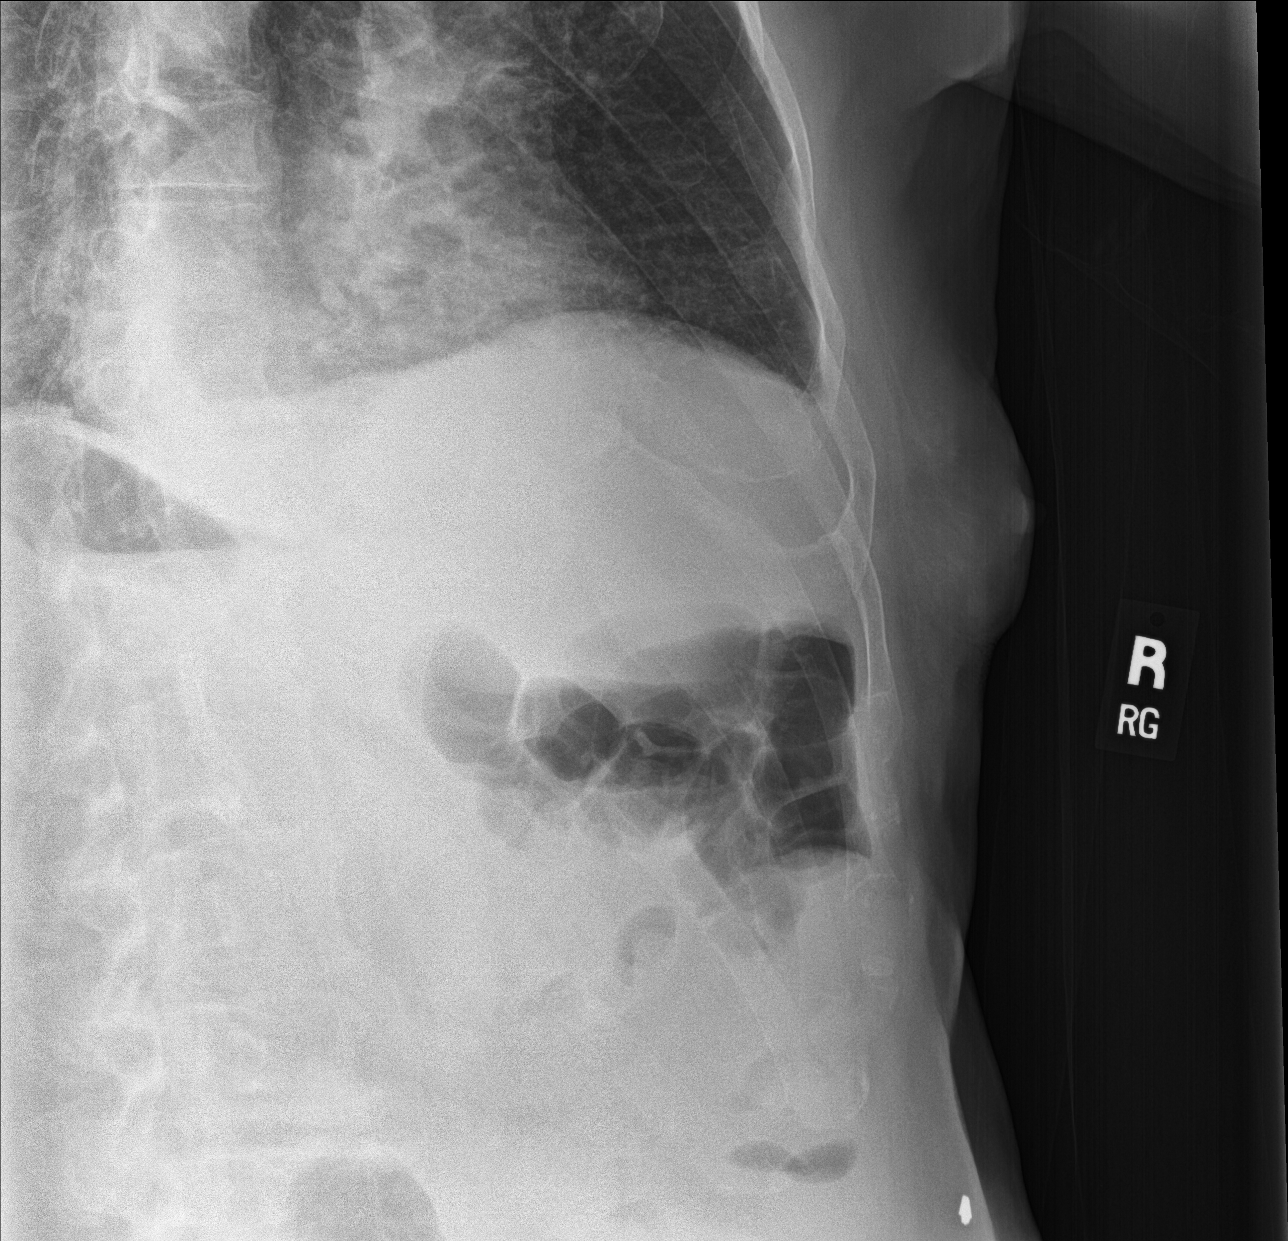

[rib obl (2 of 2)]
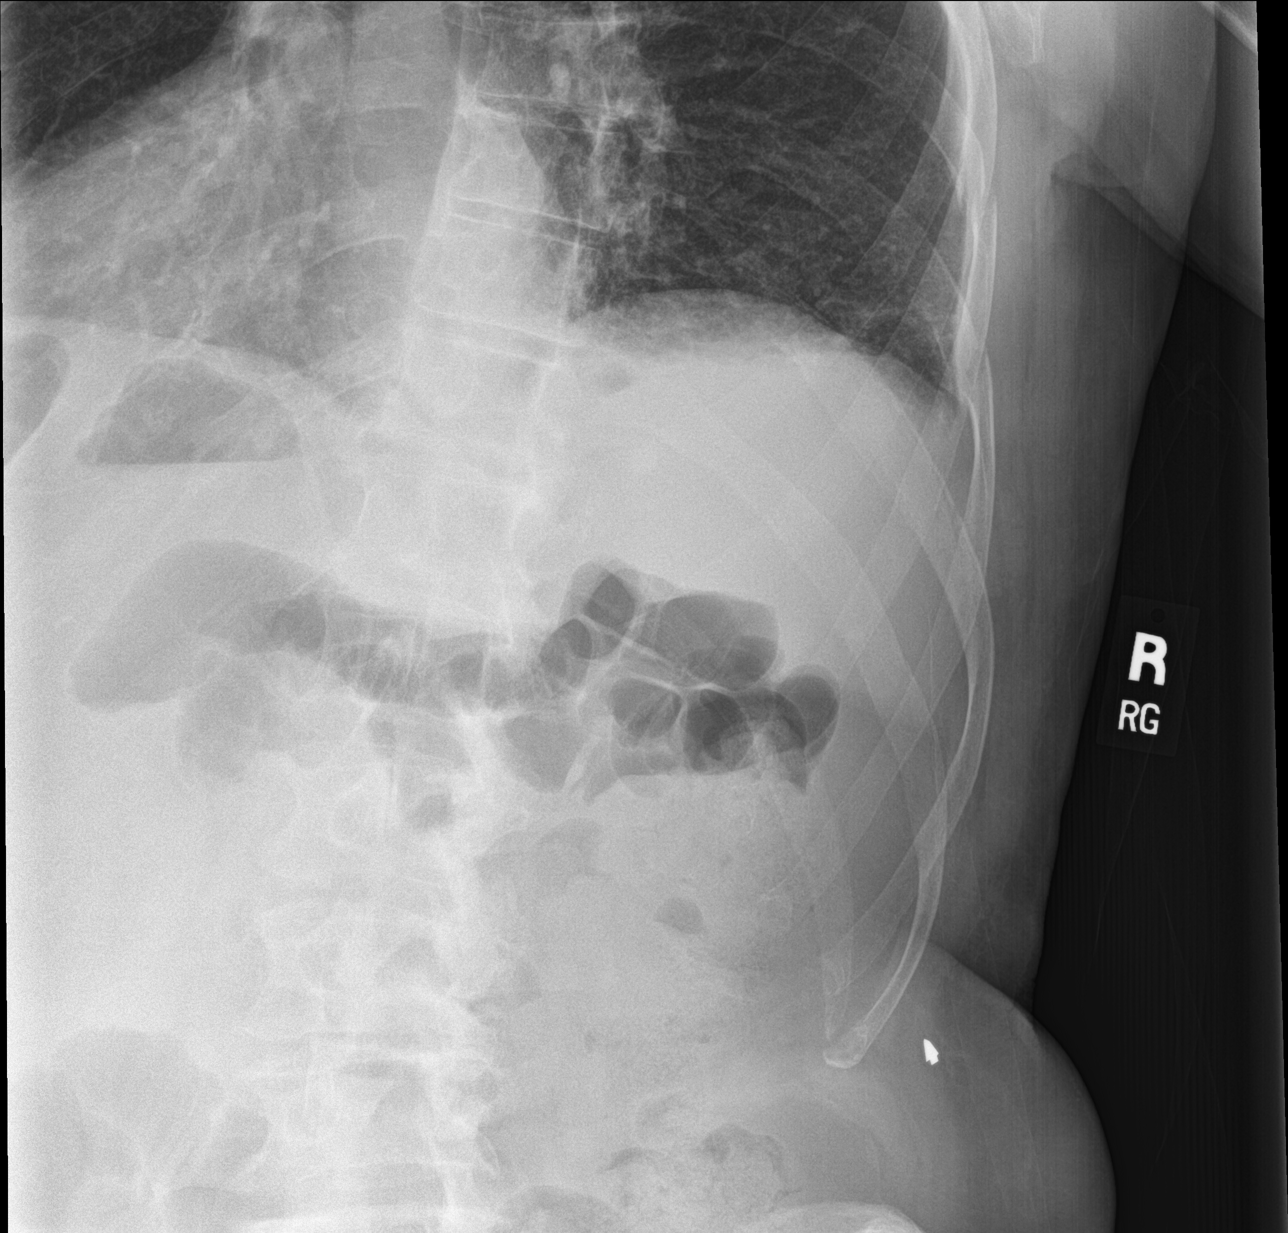

[3 of 3 positions shown; findings below may reference images not displayed]

FINDINGS: No fracture or other bone lesions are seen involving the ribs. Heart
is mildly enlarged. Lungs are clear. No effusions or pneumothorax.
IMPRESSION: No visible rib fracture.

Borderline heart size.

## 2018-05-24 ENCOUNTER — Ambulatory Visit: Attending: Neurology | Primary: Family

## 2018-05-24 MED ORDER — MEMANTINE 5 MG TAB
5 mg | ORAL_TABLET | Freq: Two times a day (BID) | ORAL | 5 refills | Status: DC
Start: 2018-05-24 — End: 2018-11-15

## 2018-05-24 NOTE — Progress Notes (Signed)
Follow up for CVA

## 2018-05-24 NOTE — Progress Notes (Signed)
Neurology Clinic Follow up Note    Patient ID:  [name]  [id number]  84 y.o.  12-29-1931      [name] is here for follow up today of stroke       Last Appointment With Me:  Hospital F/U stroke, last seen 02/02/18      Interval History:   Pt returns for f/u of aphasia/dysarthria/stroke.  Here for f/u of L MCA small territory infarct, L M2/3 occlusion not amenable to endovascular intervention. ??Mechanism likely cardioembolic in setting of Afib. S/P PPM last hospitalization.    No new deficits/focal weakness/numbness/vision loss.  He remains on Eliquis for Afib/stroke prevention.  No reported side effects/hemorrhage.    His sister is here with him today and reports some ongoing concern regarding his memory.  He is currently living alone.  He has a home health aid coming in 3x weekly to assist with  medication administration and hygiene.  He is not driving.        PMHx/ PSHx/ FHx/ SHx:  Reviewed and unchanged previous visit.   Past Medical History:   Diagnosis Date   ??? Arrhythmia    ??? Arthritis    ??? Chronic pain     RIGHT HIP   ??? Insomnia    ??? Stroke (HCC)          ROS:  Comprehensive review of systems negative except for as noted above.       Objective:       Meds:  Current Outpatient Medications   Medication Sig Dispense Refill   ??? QUEtiapine (SEROQUEL) 25 mg tablet Take 1 Tab by mouth nightly. Takes 1/2 tab by mouth at bedtime. 45 Tab 1   ??? atorvastatin (LIPITOR) 20 mg tablet Take 1 Tab by mouth daily. 90 Tab 1   ??? apixaban (ELIQUIS) 5 mg tablet Take 1 Tab by mouth two (2) times a day. 180 Tab 1   ??? famotidine (PEPCID) 20 mg tablet Take 1 Tab by mouth two (2) times a day. 180 Tab 1   ??? acetaminophen (TYLENOL) 325 mg tablet Take 2 Tabs by mouth every six (6) hours as needed.         Exam:  Visit Vitals  BP 136/82   Pulse 71   Resp 18   Wt 77.6 kg (171 lb)   SpO2 98%   BMI 23.19 kg/m??     NEUROLOGICAL EXAM:  General: Awake, alert, speech fluent.  MOCA: 23/30 (see scanned media)  CN: PERRL, EOMI without nystagmus,  VFF to confrontation, facial sensation and strength are normal and symmetric, hearing is intact to finger rub bilaterally, palate and tongue movements are intact and symmetric.  Motor: Normal tone, bulk and strength bilaterally.  Reflexes: 1/4 and symmetric, plantar stimulation is flexor.  Coordination: FNF, RAM, HTS intact.  Sensation: LT intact throughout.  Gait: Slightly unsteady    LABS  Results for orders placed or performed in visit on 05/07/18   NUCLEAR CARDIAC STRESS TEST   Result Value Ref Range    Target HR 136 bpm    Baseline HR 72 bpm    Baseline BP 120 mmHg    O2 sat rest 98 %    Percent HR 61 %    Post peak HR 83 bpm    O2 sat peak 98 %    Stress Base Diastolic BP 70 mmHg    Stress Base Systolic BP 114 mmHg    Stress Base Diastolic BP 66 mmHg  Stress Rate Pressure Product 9,462 bpm*mmHg       IMAGING:  MRI Results (most recent):  Results from Hospital Encounter encounter on 12/19/17   MRI BRAIN WO CONT    Narrative *PRELIMINARY REPORT*    Punctate foci of diffusion restriction in the left frontal lobe are compatible  with acute small vessel infarcts. No evidence of intracranial hemorrhage or  mass. Mild periventricular and subcortical white matter disease, compatible with  chronic small vessel ischemia.    Preliminary report was provided by Dr. Alessandra Bevels, the on-call radiologist, at 1:30  PM.    Final report to follow.    *END PRELIMINARY REPORT*    EXAM:  MRI BRAIN WO CONT  History: Dysarthria  INDICATION:  Dysarthria    COMPARISON: CTA 12/19/2017.    CONTRAST:  None.    TECHNIQUE: Sagittal T1, axial FLAIR, T2,T1 and gradient echo images as well as  coronal T2 weighted images and axial diffusion weighted images of the head were  obtained.    FINDINGS:      Small less than 5 mm foci of cortically based infarction in the left frontal  lobe. Anterior division MCA vascular territory. No other foci of infarction. No  Chiari or syrinx. Pituitary infundibulum unremarkable. Scattered foci of  increased T2 signal  intensity corona radiata and centrum semiovale. Small remote  lacunar infarctions in the cerebral white matter on the right and on the left.  Mucoperiosteal thickening in the left maxillary sinus. Left frontal sinus  disease. Sphenoid sinus disease as well. Trace fluid in the vascular cells.  There is no evidence of mass, hemorrhage or abnormal extra-axial fluid  collection.  Normal appearing flow-voids are present in the vertebral, basilar  and carotid artery systems. The craniocervical junction is normal.     Impression IMPRESSION:     Small acute cortical infarction left MCA M2 anterior division segment.  Mild paranasal sinus disease. Greater on the left.  Mild chronic microvascular ischemic change with additional scattered remote  lacunar infarctions in the cerebral white matter. Mild cerebral atrophy.  Correlates with CTA findings of occluded small left MCA territory segmental  branch.                   Assessment:     Encounter Diagnoses     ICD-10-CM ICD-9-CM   1. Cerebrovascular accident (CVA), unspecified mechanism (HCC) I63.9 434.91   2. Paroxysmal atrial fibrillation (HCC) I48.0 427.31   3. 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 Cognitive impairment R41.89 70.59      84 year old RHM here for f/u of L MCA embolic appearing small territory infarct, L M2/3 occlusion not amenable to endovascular intervention in the setting of atrial fibrillation diagnosed on prior admission.  He is doing well post discharge from a stroke perspective without residual focal deficits.  Remains on NOAC/Eliquis and statin therapy.    Regarding his memory, MOCA is 23/30 today and c/w amnestic MCI with deficits mainly involving delayed recall, very mild executive/visuospatial dysfunction.  He has a home health aid 3x weekly assisting with medications and meals and appears to be benefiting from this arrangement.  We discussed pursuing formal driving assessment to determine if he is  safe to resume driving at this time.  We also reviewed options for treatment of his cognitive impairment and potential side effects.    Plan:   Trial of Namenda 5mg  BID for cognitive impairment  OT  eval for driving assessment  He will need assistance with medications and finances. POA is established.    Cont. Eliquis for stroke prevention  Cont. Statin therapy  Goal SBP<140, LDL<70     Follow-up and Dispositions    ?? Return in about 6 months (around 11/23/2018).         Signed:  Hulan Saas, DO  05/24/2018

## 2018-05-24 NOTE — Patient Instructions (Signed)
PRESCRIPTION REFILL POLICY    [facility]   Statement to Patients  March 15, 2013      In an effort to ensure the large volume of patient prescription refills is processed in the most efficient and expeditious manner, we are asking our patients to assist us by calling your Pharmacy for all prescription refills, this will include also your  Mail Order Pharmacy. The pharmacy will contact our office electronically to continue the refill process.    Please do not wait until the last minute to call your pharmacy. We need at least 48 hours (2days) to fill prescriptions. We also encourage you to call your pharmacy before going to pick up your prescription to make sure it is ready.     With regard to controlled substance prescription refill requests (narcotic refills) that need to be picked up at our office, we ask your cooperation by providing us with at least 72 hours (3days) notice that you will need a refill.    We will not refill narcotic prescription refill requests after 4:00pm on any weekday, Monday through Thursday, or after 2:00pm on Fridays, or on the weekends.      We encourage everyone to explore another way of getting your prescription refill request processed using MyChart, our patient web portal through our electronic medical record system. MyChart is an efficient and effective way to communicate your medication request directly to the office and  downloadable as an app on your smart phone . MyChart also features a review functionality that allows you to view your medication list as well as leave messages for your physician. Are you ready to get connected? If so please review the attatched instructions or speak to any of our staff to get you set up right away!    Thank you so much for your cooperation. Should you have any questions please contact our Practice Administrator.    The Physicians and Staff,  [facility]

## 2018-05-24 NOTE — Progress Notes (Signed)
Neurology Clinic Follow up Note    Patient ID:  [name]  [id number]  84 y.o.  10/29/1932      [name] is here for follow up today of stroke       Last Appointment With Me:  Hospital F/U stroke, last seen 02/02/18      Interval History:   Pt returns for f/u of aphasia/dysarthria/stroke.  Here for f/u of L MCA small territory infarct, L M2/3 occlusion not amenable to endovascular intervention. ??Mechanism likely cardioembolic in setting of Afib. S/P PPM last hospitalization.    No new deficits/focal weakness/numbness/vision loss.  He remains on Eliquis for Afib/stroke prevention.  No reported side effects/hemorrhage.    His sister is here with him today and reports some ongoing concern regarding his memory.  He is currently living alone.  He has a home health aid coming in 3x weekly to assist with  medication administration and hygiene.  He is not driving.        PMHx/ PSHx/ FHx/ SHx:  Reviewed and unchanged previous visit.   Past Medical History:   Diagnosis Date   ??? Arrhythmia    ??? Arthritis    ??? Chronic pain     RIGHT HIP   ??? Insomnia    ??? Stroke (HCC)          ROS:  Comprehensive review of systems negative except for as noted above.       Objective:       Meds:  Current Outpatient Medications   Medication Sig Dispense Refill   ??? QUEtiapine (SEROQUEL) 25 mg tablet Take 1 Tab by mouth nightly. Takes 1/2 tab by mouth at bedtime. 45 Tab 1   ??? atorvastatin (LIPITOR) 20 mg tablet Take 1 Tab by mouth daily. 90 Tab 1   ??? apixaban (ELIQUIS) 5 mg tablet Take 1 Tab by mouth two (2) times a day. 180 Tab 1   ??? famotidine (PEPCID) 20 mg tablet Take 1 Tab by mouth two (2) times a day. 180 Tab 1   ??? acetaminophen (TYLENOL) 325 mg tablet Take 2 Tabs by mouth every six (6) hours as needed.         Exam:  Visit Vitals  BP 136/82   Pulse 71   Resp 18   Wt 77.6 kg (171 lb)   SpO2 98%   BMI 23.19 kg/m??     NEUROLOGICAL EXAM:  General: Awake, alert, speech fluent.  MOCA: 23/30 (see scanned media)  CN: PERRL, EOMI without nystagmus,  VFF to confrontation, facial sensation and strength are normal and symmetric, hearing is intact to finger rub bilaterally, palate and tongue movements are intact and symmetric.  Motor: Normal tone, bulk and strength bilaterally.  Reflexes: 1/4 and symmetric, plantar stimulation is flexor.  Coordination: FNF, RAM, HTS intact.  Sensation: LT intact throughout.  Gait: Slightly unsteady    LABS  Results for orders placed or performed in visit on 05/07/18   NUCLEAR CARDIAC STRESS TEST   Result Value Ref Range    Target HR 136 bpm    Baseline HR 72 bpm    Baseline BP 120 mmHg    O2 sat rest 98 %    Percent HR 61 %    Post peak HR 83 bpm    O2 sat peak 98 %    Stress Base Diastolic BP 70 mmHg    Stress Base Systolic BP 114 mmHg    Stress Base Diastolic BP 66 mmHg      Stress Rate Pressure Product 9,462 bpm*mmHg       IMAGING:  MRI Results (most recent):  Results from Hospital Encounter encounter on 12/19/17   MRI BRAIN WO CONT    Narrative *PRELIMINARY REPORT*    Punctate foci of diffusion restriction in the left frontal lobe are compatible  with acute small vessel infarcts. No evidence of intracranial hemorrhage or  mass. Mild periventricular and subcortical white matter disease, compatible with  chronic small vessel ischemia.    Preliminary report was provided by Dr. Vaughn, the on-call radiologist, at 1:30  PM.    Final report to follow.    *END PRELIMINARY REPORT*    EXAM:  MRI BRAIN WO CONT  History: Dysarthria  INDICATION:  Dysarthria    COMPARISON: CTA 12/19/2017.    CONTRAST:  None.    TECHNIQUE: Sagittal T1, axial FLAIR, T2,T1 and gradient echo images as well as  coronal T2 weighted images and axial diffusion weighted images of the head were  obtained.    FINDINGS:      Small less than 5 mm foci of cortically based infarction in the left frontal  lobe. Anterior division MCA vascular territory. No other foci of infarction. No  Chiari or syrinx. Pituitary infundibulum unremarkable. Scattered foci of  increased T2 signal  intensity corona radiata and centrum semiovale. Small remote  lacunar infarctions in the cerebral white matter on the right and on the left.  Mucoperiosteal thickening in the left maxillary sinus. Left frontal sinus  disease. Sphenoid sinus disease as well. Trace fluid in the vascular cells.  There is no evidence of mass, hemorrhage or abnormal extra-axial fluid  collection.  Normal appearing flow-voids are present in the vertebral, basilar  and carotid artery systems. The craniocervical junction is normal.     Impression IMPRESSION:     Small acute cortical infarction left MCA M2 anterior division segment.  Mild paranasal sinus disease. Greater on the left.  Mild chronic microvascular ischemic change with additional scattered remote  lacunar infarctions in the cerebral white matter. Mild cerebral atrophy.  Correlates with CTA findings of occluded small left MCA territory segmental  branch.                   Assessment:     Encounter Diagnoses     ICD-10-CM ICD-9-CM   1. Cerebrovascular accident (CVA), unspecified mechanism (HCC) I63.9 434.91   2. Paroxysmal atrial fibrillation (HCC) I48.0 427.31   3. 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 Cognitive impairment R41.89 294.9      84 year old RHM here for f/u of L MCA embolic appearing small territory infarct, L M2/3 occlusion not amenable to endovascular intervention in the setting of atrial fibrillation diagnosed on prior admission.  He is doing well post discharge from a stroke perspective without residual focal deficits.  Remains on NOAC/Eliquis and statin therapy.    Regarding his memory, MOCA is 23/30 today and c/w amnestic MCI with deficits mainly involving delayed recall, very mild executive/visuospatial dysfunction.  He has a home health aid 3x weekly assisting with medications and meals and appears to be benefiting from this arrangement.  We discussed pursuing formal driving assessment to determine if he is  safe to resume driving at this time.  We also reviewed options for treatment of his cognitive impairment and potential side effects.    Plan:   Trial of Namenda 5mg BID for cognitive impairment  OT   eval for driving assessment  He will need assistance with medications and finances. POA is established.    Cont. Eliquis for stroke prevention  Cont. Statin therapy  Goal SBP<140, LDL<70     Follow-up and Dispositions    ?? Return in about 6 months (around 11/23/2018).         Signed:  Bertine Schlottman M Rufina Kimery, DO  05/24/2018

## 2018-05-25 NOTE — Progress Notes (Signed)
Pacer with NSVT 4 seconds 05/22/2018  Future Appointments   Date Time Provider Department Center   05/27/2018 11:20 AM Marcellus ScottBrowning, Christine M, MD [facility] SCHED   08/02/2018  9:30 AM Laurence SpatesSanderford, Soeria, NP [facility] SCHED   08/04/2018  1:45 PM REMOTE1, Billie LadeEYNOLDS [facility] SCHED   10/27/2018 10:30 AM REMOTE1, Billie LadeEYNOLDS [facility] SCHED   11/15/2018 11:40 AM Hulan Saasonaldson, Rachel M, DO [facility] SCHED   02/09/2019  9:00 AM Miquel DunnEMOTE1, Billie LadeEYNOLDS [facility] SCHED   05/03/2019  2:00 PM PACEMAKER3, Billie LadeREYNOLDS [facility] SCHED   05/03/2019  2:20 PM Thurston PoundsNgo, Sami Froh, MD [facility]

## 2018-05-25 NOTE — Progress Notes (Signed)
Pacer with NSVT 4 seconds 05/22/2018  Future Appointments   Date Time Provider Department Center   05/27/2018 11:20 AM Browning, Christine M, MD [facility] SCHED   08/02/2018  9:30 AM Sanderford, Soeria, NP [facility] SCHED   08/04/2018  1:45 PM REMOTE1, REYNOLDS [facility] SCHED   10/27/2018 10:30 AM REMOTE1, REYNOLDS [facility] SCHED   11/15/2018 11:40 AM Donaldson, Rachel M, DO [facility] SCHED   02/09/2019  9:00 AM REMOTE1, REYNOLDS [facility] SCHED   05/03/2019  2:00 PM PACEMAKER3, REYNOLDS [facility] SCHED   05/03/2019  2:20 PM Chaynce Schafer, MD [facility] SCHED

## 2018-05-27 ENCOUNTER — Ambulatory Visit: Admit: 2018-05-27 | Discharge: 2018-05-27 | Payer: MEDICARE | Attending: Internal Medicine | Primary: Family

## 2018-05-27 DIAGNOSIS — I495 Sick sinus syndrome: Secondary | ICD-10-CM

## 2018-05-27 NOTE — Progress Notes (Signed)
[facility] [place]: Dahlia Client  [phone]    Fu yearly beginning 12/19, alternating with Dr. Ramond Dial)  Requesting/referring provider: Ms. Kennis Carina  Reason for Consult: afib    HPI: [name], a 84 y.o. year-old who presents for evaluation of afib and CVA in setting of SSS and with recent pacemaker implant.   Today he is in good spirits. With family. Feeling fine, has no new complaints. Getting around, no falls, dizziness, chest pain, dyspnea, edema, etc.   Pacer site  Is healed.   Weak at times, a little tired, but doing more with time. No falls, no dizziness.     Assessment/Plan:  1. ??Acute CVA - left frontal lobe with acute infarct on MRI, no hemorrhage or mass, evidence of chronic small vessel ischemia, also found to be in Afib and now on eliquis for [facility]  -continue statin  -Neuro followup 12/19  2.  Atrial Fib - with significant bradycardia/SSS, s/p placement of dual chamber pacemaker   -last pacer check with normal function.   -CHADS2VASC=5 for age, vascular disease, CVA, continue eliquis 5mg  BID   -TTE ok, TSH 8.7 but T4 0.9, will need repeat TFTs in 6 weeks    3.  Elevated blood pressure - normal today without treatment  4.  Thrombocytopenia - etiology unknown at this time, Plt 88 K today, workup per hematology    5. Body mass index is 23.25 kg/m??. weight is stable      Fam hx: no early CAD, Afib, CHF, pacemakers  Soc hx: no tobacco use, one alcoholic beverage nightly   5/19 Lexi nuc A fib occRV pacing Frequent PVCsNo ischemia/ infarctLVEF 53%  12/22/17: placement of St Jude dual chamber pacer  TTE 12/21/17 - LVEF 65 % to 70 %, no WMA, RV moderately dilated, dilated RA, mild MR, mild to mod TR  He  has a past medical history of Arrhythmia, Arthritis, Chronic pain, Insomnia, and Stroke (HCC).    Cardiovascular ROS: negative for - chest pain or dyspnea on exertion  Respiratory ROS: no cough, shortness of breath, or wheezing  Neurological ROS: no TIA or stroke symptoms  All other systems negative  except as above.     PE  Vitals:    05/27/18 1114   BP: 122/80   Pulse: 92   Resp: 16   SpO2: 95%   Weight: 171 lb 6.4 oz (77.7 kg)   Height: 6' (1.829 m)    Body mass index is 23.25 kg/m??.   General appearance - alert, well appearing, and in no distress  Mental status - affect appropriate to mood  Eyes - sclera anicteric, moist mucous membranes  Neck - supple, no significant adenopathy  Lymphatics - no  lymphadenopathy  Chest - clear to auscultation, scatt crackles  Heart - normal rate, regular rhythm, normal S1, S2, no murmurs, rubs, clicks or gallops  Abdomen - soft, nontender, nondistended, no masses or organomegaly  Back exam - full range of motion, no tenderness  Neurological - cranial nerves II through XII grossly intact, no focal deficit  Musculoskeletal - no muscular tenderness noted, normal strength  Extremities - peripheral pulses 1+ BLE, no pedal edema  Skin - normal coloration  no rashes    Recent Labs:  Lab Results   Component Value Date/Time    Cholesterol, total 120 04/06/2018 10:42 AM    HDL Cholesterol 43 04/06/2018 10:42 AM    LDL, calculated 65 04/06/2018 10:42 AM    Triglyceride 60 04/06/2018 10:42  AM    CHOL/HDL Ratio 3.1 12/20/2017 05:40 AM     Lab Results   Component Value Date/Time    Creatinine 0.99 12/23/2017 05:43 AM     Lab Results   Component Value Date/Time    BUN 9 12/23/2017 05:43 AM     Lab Results   Component Value Date/Time    Potassium 4.0 12/23/2017 05:43 AM     Lab Results   Component Value Date/Time    Hemoglobin A1c 6.1 12/20/2017 05:40 AM     Lab Results   Component Value Date/Time    HGB 13.9 01/26/2018 03:09 PM     Lab Results   Component Value Date/Time    PLATELET 101 (L) 01/26/2018 03:09 PM       Reviewed:  Past Medical History:   Diagnosis Date   ??? Arrhythmia    ??? Arthritis    ??? Chronic pain     RIGHT HIP   ??? Insomnia    ??? Stroke [facility](HCC)      Social History     Tobacco Use   Smoking Status Never Smoker   Smokeless Tobacco Never Used     Social History     Substance and  Sexual Activity   Alcohol Use Yes    Comment: 2/WEEK VODKA     No Known Allergies    Current Outpatient Medications   Medication Sig   ??? QUEtiapine (SEROQUEL) 25 mg tablet Take 1 Tab by mouth nightly. Takes 1/2 tab by mouth at bedtime.   ??? atorvastatin (LIPITOR) 20 mg tablet Take 1 Tab by mouth daily.   ??? apixaban (ELIQUIS) 5 mg tablet Take 1 Tab by mouth two (2) times a day.   ??? famotidine (PEPCID) 20 mg tablet Take 1 Tab by mouth two (2) times a day. (Patient taking differently: Take 20 mg by mouth as needed.)   ??? acetaminophen (TYLENOL) 325 mg tablet Take 2 Tabs by mouth every six (6) hours as needed.   ??? memantine (NAMENDA) 5 mg tablet Take 1 Tab by mouth two (2) times a day.     No current facility-administered medications for this visit.        Marcellus Scotthristine M Mccabe Gloria, MD  [facility]  [address], [address]  [place], [place] [id number]

## 2018-05-27 NOTE — Progress Notes (Signed)
[facility] [place]: [name]  [phone]    Fu yearly beginning 12/19, alternating with Dr. Ramond DialNgo(5/20)  Requesting/referring provider: [name]  Reason for Consult: afib    HPI: [name], a 84 y.o. year-old who presents for evaluation of afib and CVA in setting of SSS and with recent pacemaker implant.   Today he is in good spirits. With family. Feeling fine, has no new complaints. Getting around, no falls, dizziness, chest pain, dyspnea, edema, etc.   Pacer site  Is healed.   Weak at times, a little tired, but doing more with time. No falls, no dizziness.     Assessment/Plan:  1. ??Acute CVA - left frontal lobe with acute infarct on MRI, no hemorrhage or mass, evidence of chronic small vessel ischemia, also found to be in Afib and [facility]  -continue statin  -Neuro followup 12/19  2.  Atrial Fib - with significant bradycardia/SSS, s/p placement of dual chamber pacemaker   -last pacer check with normal function.   -CHADS2VASC=5 for age, vascular disease, CVA, continue eliquis 5mg  BID   -TTE ok, TSH 8.7 but T4 0.9, will need repeat TFTs in 6 weeks    3.  Elevated blood pressure - normal today without treatment  4.  Thrombocytopenia - etiology unknown at this time, Plt 88 K today, workup per hematology    5. Body mass index is 23.25 kg/m??. weight is stable      Fam hx: no early CAD, Afib, CHF, pacemakers  Soc hx: no tobacco use, one alcoholic beverage nightly   5/19 Lexi nuc A fib occRV pacing Frequent PVCsNo ischemia/ infarctLVEF 53%  12/22/17: placement of St Jude dual chamber pacer  TTE 12/21/17 - LVEF 65 % to 70 %, no WMA, RV moderately dilated, dilated RA, mild MR, mild to mod TR  He  has a past medical history of Arrhythmia, Arthritis, Chronic pain, Insomnia, and Stroke (HCC).    Cardiovascular ROS: negative for - chest pain or dyspnea on exertion  Respiratory ROS: no cough, shortness of breath, or wheezing  Neurological ROS: no TIA or stroke symptoms   All other systems negative except as above.     PE  Vitals:    05/27/18 1114   BP: 122/80   Pulse: 92   Resp: 16   SpO2: 95%   Weight: 171 lb 6.4 oz (77.7 kg)   Height: 6' (1.829 m)    Body mass index is 23.25 kg/m??.   General appearance - alert, well appearing, and in no distress  Mental status - affect appropriate to mood  Eyes - sclera anicteric, moist mucous membranes  Neck - supple, no significant adenopathy  Lymphatics - no  lymphadenopathy  Chest - clear to auscultation, scatt crackles  Heart - normal rate, regular rhythm, normal S1, S2, no murmurs, rubs, clicks or gallops  Abdomen - soft, nontender, nondistended, no masses or organomegaly  Back exam - full range of motion, no tenderness  Neurological - cranial nerves II through XII grossly intact, no focal deficit  Musculoskeletal - no muscular tenderness noted, normal strength  Extremities - peripheral pulses 1+ BLE, no pedal edema  Skin - normal coloration  no rashes    Recent Labs:  Lab Results   Component Value Date/Time    Cholesterol, total 120 04/06/2018 10:42 AM    HDL Cholesterol 43 04/06/2018 10:42 AM    LDL, calculated 65 04/06/2018 10:42 AM    Triglyceride 60 04/06/2018 10:42  AM    CHOL/HDL Ratio 3.1 12/20/2017 05:40 AM     Lab Results   Component Value Date/Time    Creatinine 0.99 12/23/2017 05:43 AM     Lab Results   Component Value Date/Time    BUN 9 12/23/2017 05:43 AM     Lab Results   Component Value Date/Time    Potassium 4.0 12/23/2017 05:43 AM     Lab Results   Component Value Date/Time    Hemoglobin A1c 6.1 12/20/2017 05:40 AM     Lab Results   Component Value Date/Time    HGB 13.9 01/26/2018 03:09 PM     Lab Results   Component Value Date/Time    PLATELET 101 (L) 01/26/2018 03:09 PM       Reviewed:  Past Medical History:   Diagnosis Date   ??? Arrhythmia    ??? Arthritis    ??? Chronic pain     RIGHT HIP   ??? Insomnia    ??? Stroke [facility])      Social History     Tobacco Use   Smoking Status Never Smoker   Smokeless Tobacco Never Used      Social History     Substance and Sexual Activity   Alcohol Use Yes    Comment: 2/WEEK VODKA     No Known Allergies    Current Outpatient Medications   Medication Sig   ??? QUEtiapine (SEROQUEL) 25 mg tablet Take 1 Tab by mouth nightly. Takes 1/2 tab by mouth at bedtime.   ??? atorvastatin (LIPITOR) 20 mg tablet Take 1 Tab by mouth daily.   ??? apixaban (ELIQUIS) 5 mg tablet Take 1 Tab by mouth two (2) times a day.   ??? famotidine (PEPCID) 20 mg tablet Take 1 Tab by mouth two (2) times a day. (Patient taking differently: Take 20 mg by mouth as needed.)   ??? acetaminophen (TYLENOL) 325 mg tablet Take 2 Tabs by mouth every six (6) hours as needed.   ??? memantine (NAMENDA) 5 mg tablet Take 1 Tab by mouth two (2) times a day.     No current facility-administered medications for this visit.        [name], MD  [facility]  [address]  [place], [place] [address]

## 2018-06-09 ENCOUNTER — Encounter

## 2018-06-09 MED ORDER — QUETIAPINE 25 MG TAB
25 mg | ORAL_TABLET | ORAL | 3 refills | Status: DC
Start: 2018-06-09 — End: 2018-08-02

## 2018-06-09 MED ORDER — QUETIAPINE 25 MG TAB
25 mg | ORAL_TABLET | Freq: Every evening | ORAL | 3 refills | Status: DC
Start: 2018-06-09 — End: 2018-06-09

## 2018-06-09 NOTE — Telephone Encounter (Signed)
rx clarified for pharmacy

## 2018-06-09 NOTE — Telephone Encounter (Signed)
Pt's sister calling because they haven't heard from [facility] for his driving eval. Please call back

## 2018-06-09 NOTE — Telephone Encounter (Signed)
Spoke with Mrs.Cobaugh, provided contact information for [facility].

## 2018-06-11 NOTE — Telephone Encounter (Signed)
Calling to get sooner appt than 12/2 re driving evaluation. Please call back

## 2018-06-14 NOTE — Telephone Encounter (Signed)
Spoke with Danny AbrahamsMary [name], informed her per Dr.Donaldson-Please advise patient I have reviewed his driving evaluation. ??He is cleared to drive short distances in familiar locations only, avoid driving during inclement weather or during high traffic times of the day as well as on highways per report. ??If he would like a copy of the report, we can mail it to his home. ??   She verbalized understanding, requesting a copy be mailed.

## 2018-06-14 NOTE — Telephone Encounter (Addendum)
*   Message from [place] below:      ----- Message from Harold BarbanYvonne D Dabney sent at 06/14/2018 12:23 PM EDT -----  Regarding: Dr. Clide Dalesonaldson/Telephone  Pt's sister in law(MaryAnn Anne HahnWillis) returned nurse call.  Best contact number [phone].

## 2018-06-14 NOTE — Telephone Encounter (Signed)
Left message for patient to call back.

## 2018-07-15 ENCOUNTER — Encounter

## 2018-07-16 MED ORDER — ATORVASTATIN 20 MG TAB
20 mg | ORAL_TABLET | ORAL | 3 refills | Status: DC
Start: 2018-07-16 — End: 2018-08-02

## 2018-07-16 MED ORDER — FAMOTIDINE 20 MG TAB
20 mg | ORAL_TABLET | ORAL | 3 refills | Status: DC
Start: 2018-07-16 — End: 2018-08-02

## 2018-08-02 ENCOUNTER — Ambulatory Visit: Attending: Family | Primary: Family

## 2018-08-02 ENCOUNTER — Ambulatory Visit: Admit: 2018-08-02 | Discharge: 2018-08-02 | Payer: MEDICARE | Attending: Family | Primary: Family

## 2018-08-02 DIAGNOSIS — E781 Pure hyperglyceridemia: Secondary | ICD-10-CM

## 2018-08-02 NOTE — Progress Notes (Signed)
 Chief Complaint   Patient presents with   . Cholesterol Problem     F/u   . Medication Evaluation     1. Have you been to the ER, urgent care clinic since your last visit?  Hospitalized since your last visit?No    2. Have you seen or consulted any other health care providers outside of the [facility] since your last visit?  Include any pap smears or colon screening. No   [name] is a 84 y.o. male  who presents for routine immunizations.   he denies any symptoms , reactions or allergies that would exclude them from being immunized today.  Risks and adverse reactions were discussed and the VIS was given to them. All questions were addressed.  he was observed for 10 min post injection. There were no reactions observed.    Luciana JAYSON Louder, LPN

## 2018-08-02 NOTE — Progress Notes (Signed)
HISTORY OF PRESENT ILLNESS  [name] is a 84 y.o. male.  [id number]:Cardiovascular Review  The patient has hyperlipidemia.  He reports taking medications as instructed, no medication side effects noted.  Diet and Lifestyle: generally follows a low fat low cholesterol diet, generally follows a low sodium diet, no formal exercise but active during the day.  Lab review: labs reviewed and discussed with patient.    He is due for pneumonia vaccine  Past Medical History:   Diagnosis Date   ??? Arrhythmia    ??? Arthritis    ??? Chronic pain     RIGHT HIP   ??? Insomnia    ??? Stroke [facility](HCC)      Past Surgical History:   Procedure Laterality Date   ??? ABDOMEN SURGERY PROC UNLISTED     ??? HX PACEMAKER     No Known Allergies    Current Outpatient Medications:   ???  memantine (NAMENDA) 5 mg tablet, Take 1 Tab by mouth two (2) times a day., Disp: 60 Tab, Rfl: 5  ???  QUEtiapine (SEROQUEL) 25 mg tablet, Take 1 Tab by mouth nightly. Takes 1/2 tab by mouth at bedtime., Disp: 45 Tab, Rfl: 1  ???  atorvastatin (LIPITOR) 20 mg tablet, Take 1 Tab by mouth daily., Disp: 90 Tab, Rfl: 1  ???  apixaban (ELIQUIS) 5 mg tablet, Take 1 Tab by mouth two (2) times a day., Disp: 180 Tab, Rfl: 1  ???  famotidine (PEPCID) 20 mg tablet, Take 1 Tab by mouth two (2) times a day. (Patient taking differently: Take 20 mg by mouth as needed.), Disp: 180 Tab, Rfl: 1  ???  acetaminophen (TYLENOL) 325 mg tablet, Take 2 Tabs by mouth every six (6) hours as needed., Disp: , Rfl:   Review of Systems   Constitutional: Negative.    Respiratory: Negative.    Cardiovascular: Negative.    Gastrointestinal: Negative.      Blood pressure 128/66, pulse 77, temperature 97.8 ??F (36.6 ??C), temperature source Oral, resp. rate 18, height 6' (1.829 m), weight 169 lb 6.4 oz (76.8 kg), SpO2 97 %.    Physical Exam   Constitutional: No distress.   HENT:   Mouth/Throat: Oropharynx is clear and moist.   Neck: Normal range of motion. Neck supple.   Cardiovascular: Normal rate and regular rhythm.   No  murmur heard.  Pulmonary/Chest: Effort normal and breath sounds normal.   Abdominal: Soft. Bowel sounds are normal.   Nursing note and vitals reviewed.      ASSESSMENT and PLAN  Diagnoses and all orders for this visit:    1. Pure hyperglyceridemia  -     LIPID PANEL  -     METABOLIC PANEL, COMPREHENSIVE    2. Pacemaker    3. Encounter for immunization  -     PNEUMOCOCCAL POLYSACCHARIDE VACCINE, 23-VALENT, ADULT OR IMMUNOSUPPRESSED PT DOSE,  -     ADMIN PNEUMOCOCCAL VACCINE  Follow up in six months for physical  Pt was given an after visit summary which includes diagnosis, current medicines and vital and voiced understanding of treatment plan

## 2018-08-02 NOTE — Progress Notes (Signed)
HISTORY OF PRESENT ILLNESS  [name] is a 84 y.o. male.  BJY:[id number]:Cardiovascular Review  The patient has hyperlipidemia.  He reports taking medications as instructed, no medication side effects noted.  Diet and Lifestyle: generally follows a low fat low cholesterol diet, generally follows a low sodium diet, no formal exercise but active during the day.  Lab review: labs reviewed and discussed with patient.    He is due for pneumonia vaccine  Past Medical History:   Diagnosis Date   ??? Arrhythmia    ??? Arthritis    ??? Chronic pain     RIGHT HIP   ??? Insomnia    ??? Stroke [facility](HCC)      Past Surgical History:   Procedure Laterality Date   ??? ABDOMEN SURGERY PROC UNLISTED     ??? HX PACEMAKER     No Known Allergies    Current Outpatient Medications:   ???  memantine (NAMENDA) 5 mg tablet, Take 1 Tab by mouth two (2) times a day., Disp: 60 Tab, Rfl: 5  ???  QUEtiapine (SEROQUEL) 25 mg tablet, Take 1 Tab by mouth nightly. Takes 1/2 tab by mouth at bedtime., Disp: 45 Tab, Rfl: 1  ???  atorvastatin (LIPITOR) 20 mg tablet, Take 1 Tab by mouth daily., Disp: 90 Tab, Rfl: 1  ???  apixaban (ELIQUIS) 5 mg tablet, Take 1 Tab by mouth two (2) times a day., Disp: 180 Tab, Rfl: 1  ???  famotidine (PEPCID) 20 mg tablet, Take 1 Tab by mouth two (2) times a day. (Patient taking differently: Take 20 mg by mouth as needed.), Disp: 180 Tab, Rfl: 1  ???  acetaminophen (TYLENOL) 325 mg tablet, Take 2 Tabs by mouth every six (6) hours as needed., Disp: , Rfl:   Review of Systems   Constitutional: Negative.    Respiratory: Negative.    Cardiovascular: Negative.    Gastrointestinal: Negative.      Blood pressure 128/66, pulse 77, temperature 97.8 ??F (36.6 ??C), temperature source Oral, resp. rate 18, height 6' (1.829 m), weight 169 lb 6.4 oz (76.8 kg), SpO2 97 %.    Physical Exam   Constitutional: No distress.   HENT:   Mouth/Throat: Oropharynx is clear and moist.   Neck: Normal range of motion. Neck supple.   Cardiovascular: Normal rate and regular rhythm.    No murmur heard.  Pulmonary/Chest: Effort normal and breath sounds normal.   Abdominal: Soft. Bowel sounds are normal.   Nursing note and vitals reviewed.      ASSESSMENT and PLAN  Diagnoses and all orders for this visit:    1. Pure hyperglyceridemia  -     LIPID PANEL  -     METABOLIC PANEL, COMPREHENSIVE    2. Pacemaker    3. Encounter for immunization  -     PNEUMOCOCCAL POLYSACCHARIDE VACCINE, 23-VALENT, ADULT OR IMMUNOSUPPRESSED PT DOSE,  -     ADMIN PNEUMOCOCCAL VACCINE  Follow up in six months for physical  Pt was given an after visit summary which includes diagnosis, current medicines and vital and voiced understanding of treatment plan

## 2018-08-02 NOTE — Progress Notes (Signed)
Chief Complaint   Patient presents with   ??? Cholesterol Problem     F/u   ??? Medication Evaluation     1. Have you been to the ER, urgent care clinic since your last visit?  Hospitalized since your last visit?No    2. Have you seen or consulted any other health care providers outside of the [facility] since your last visit?  Include any pap smears or colon screening. No   [name] is a 84 y.o. male  who presents for routine immunizations.   he denies any symptoms , reactions or allergies that would exclude them from being immunized today.  Risks and adverse reactions were discussed and the VIS was given to them. All questions were addressed.  he was observed for 10 min post injection. There were no reactions observed.    Jaslynne Dahan C Donat Humble, LPN

## 2018-08-04 ENCOUNTER — Ambulatory Visit: Primary: Family

## 2018-08-04 ENCOUNTER — Encounter: Admit: 2018-08-04 | Discharge: 2018-08-04 | Payer: MEDICARE | Attending: Family | Primary: Family

## 2018-08-04 ENCOUNTER — Ambulatory Visit: Admit: 2018-08-04 | Discharge: 2018-08-04 | Payer: MEDICARE | Primary: Family

## 2018-08-04 ENCOUNTER — Inpatient Hospital Stay: Admit: 2018-11-10 | Payer: MEDICARE | Primary: Family

## 2018-08-04 DIAGNOSIS — Z950 Presence of cardiac pacemaker: Secondary | ICD-10-CM

## 2018-08-04 DIAGNOSIS — E781 Pure hyperglyceridemia: Secondary | ICD-10-CM

## 2018-08-04 NOTE — Progress Notes (Signed)
See scanned Pacemaker Report in Chart Review.  Chargeable visit.

## 2018-08-05 LAB — COMPREHENSIVE METABOLIC PANEL
ALT: 15 IU/L (ref 0–44)
AST: 19 IU/L (ref 0–40)
Albumin/Globulin Ratio: 1.9 NA (ref 1.2–2.2)
Albumin: 4.2 g/dL (ref 3.5–4.7)
Alkaline Phosphatase: 71 IU/L (ref 39–117)
BUN: 14 mg/dL (ref 8–27)
Bun/Cre Ratio: 15 NA (ref 10–24)
CO2: 24 mmol/L (ref 20–29)
Calcium: 9.3 mg/dL (ref 8.6–10.2)
Chloride: 108 mmol/L — ABNORMAL HIGH (ref 96–106)
Creatinine: 0.95 mg/dL (ref 0.76–1.27)
EGFR IF NonAfrican American: 73 mL/min/{1.73_m2} (ref >59)
GFR African American: 85 mL/min/{1.73_m2} (ref >59)
Globulin, Total: 2.2 g/dL (ref 1.5–4.5)
Glucose: 82 mg/dL (ref 65–99)
Potassium: 4.2 mmol/L (ref 3.5–5.2)
Sodium: 144 mmol/L (ref 134–144)
Total Bilirubin: 1 mg/dL (ref 0.0–1.2)
Total Protein: 6.4 g/dL (ref 6.0–8.5)

## 2018-08-05 LAB — LIPID PANEL
Cholesterol, Total: 115 mg/dL (ref 100–199)
Cholesterol, total: 115 mg/dL (ref 100–199)
HDL Cholesterol: 47 mg/dL (ref >39)
HDL: 47 mg/dL (ref >39)
LDL Calculated: 57 mg/dL (ref 0–99)
LDL, calculated: 57 mg/dL (ref 0–99)
Triglyceride: 53 mg/dL (ref 0–149)
Triglycerides: 53 mg/dL (ref 0–149)
VLDL Cholesterol Calculated: 11 mg/dL (ref 5–40)
VLDL, calculated: 11 mg/dL (ref 5–40)

## 2018-08-05 LAB — CVD REPORT

## 2018-08-05 LAB — METABOLIC PANEL, COMPREHENSIVE
A-G Ratio: 1.9 (ref 1.2–2.2)
ALT (SGPT): 15 IU/L (ref 0–44)
AST (SGOT): 19 IU/L (ref 0–40)
Albumin: 4.2 g/dL (ref 3.5–4.7)
Alk. phosphatase: 71 IU/L (ref 39–117)
BUN/Creatinine ratio: 15 (ref 10–24)
BUN: 14 mg/dL (ref 8–27)
Bilirubin, total: 1 mg/dL (ref 0.0–1.2)
CO2: 24 mmol/L (ref 20–29)
Calcium: 9.3 mg/dL (ref 8.6–10.2)
Chloride: 108 mmol/L — ABNORMAL HIGH (ref 96–106)
Creatinine: 0.95 mg/dL (ref 0.76–1.27)
GFR est AA: 85 mL/min/{1.73_m2} (ref >59)
GFR est non-AA: 73 mL/min/{1.73_m2} (ref >59)
GLOBULIN, TOTAL: 2.2 g/dL (ref 1.5–4.5)
Glucose: 82 mg/dL (ref 65–99)
Potassium: 4.2 mmol/L (ref 3.5–5.2)
Protein, total: 6.4 g/dL (ref 6.0–8.5)
Sodium: 144 mmol/L (ref 134–144)

## 2018-08-07 NOTE — Telephone Encounter (Signed)
Spoke to pt, advised of lab results and recommendations as noted in patient letter. Pt voices understanding and thanks

## 2018-10-02 ENCOUNTER — Encounter

## 2018-10-02 MED ORDER — QUETIAPINE 25 MG TAB
25 mg | ORAL_TABLET | ORAL | 1 refills | Status: DC
Start: 2018-10-02 — End: 2018-11-24

## 2018-10-27 ENCOUNTER — Encounter: Primary: Family

## 2018-10-29 ENCOUNTER — Telehealth

## 2018-10-29 ENCOUNTER — Encounter

## 2018-10-29 MED ORDER — ELIQUIS 5 MG TABLET
5 mg | ORAL_TABLET | ORAL | 1 refills | Status: DC
Start: 2018-10-29 — End: 2018-10-29

## 2018-10-29 MED ORDER — APIXABAN 5 MG TABLET
5 mg | ORAL_TABLET | ORAL | 1 refills | Status: DC
Start: 2018-10-29 — End: 2019-09-30

## 2018-10-29 NOTE — Telephone Encounter (Signed)
Pt.'s caregiver Denyse DagoMarry Ann is calling requesting a call back from the nurse in regards to a question about one of pt.'s medication ( Seroquel 25 mg tab). Corrie DandyMary stated she wanted to make sure that pt. Suppose to take 1 1/2 tab of this meds nightly.     Best call back# [phone]

## 2018-11-08 ENCOUNTER — Ambulatory Visit: Primary: Family

## 2018-11-08 ENCOUNTER — Ambulatory Visit: Admit: 2018-11-08 | Discharge: 2018-11-08 | Payer: MEDICARE | Primary: Family

## 2018-11-08 DIAGNOSIS — Z950 Presence of cardiac pacemaker: Secondary | ICD-10-CM

## 2018-11-08 NOTE — Progress Notes (Signed)
See scanned Pacemaker Report in Chart Review.  Chargeable visit.

## 2018-11-15 ENCOUNTER — Ambulatory Visit: Attending: Neurology | Primary: Family

## 2018-11-15 ENCOUNTER — Ambulatory Visit: Admit: 2018-11-15 | Discharge: 2018-11-15 | Payer: MEDICARE | Attending: Neurology | Primary: Family

## 2018-11-15 DIAGNOSIS — I639 Cerebral infarction, unspecified: Secondary | ICD-10-CM

## 2018-11-15 MED ORDER — MEMANTINE 21 MG SPRINKLE ER 24HR CAPSULE
21 mg | ORAL_CAPSULE | Freq: Every day | ORAL | 1 refills | Status: DC
Start: 2018-11-15 — End: 2019-05-18

## 2018-11-15 NOTE — Progress Notes (Signed)
Neurology Clinic Follow up Note    Patient ID:  [name]  [id number]  84 y.o.  [birth year]      [name] is here for follow up today of stroke       Last Appointment With Me:  05/24/18      Interval History:   Pt returns for f/u of aphasia/dysarthria/stroke.  Here for f/u of L MCA small territory infarct, L M2/3 occlusion not amenable to endovascular intervention. ??Mechanism likely cardioembolic in setting of Afib. S/P PPM last hospitalization.    No new deficits/focal weakness/numbness/vision loss.  He reports occasional dizziness/lightheadedness.  He remains on Eliquis for Afib/stroke prevention.  No reported side effects/hemorrhage.    He reports memory is stable/unchanged from last visit.  His caregiver reports he is forgetting to take his evening medications on occasion.  Denies difficulty with finances.  She has noted some occasional repetition at times.  Tolerating Namenda well without side effects.  He has lost some weight since last visit.  His caregiver states he is not eating consistently.  She is there to assist him in the mornings only.    He is currently driving, limited to short distances, not on highways following OT driving assessment.    He is maintained on low dose Seroquel in the evenings.  He denies issues with sleep currently and no reported behavioral concerns.      PMHx/ PSHx/ FHx/ SHx:  Reviewed and unchanged previous visit.   Past Medical History:   Diagnosis Date   ??? Arrhythmia    ??? Arthritis    ??? Chronic pain     RIGHT HIP   ??? Insomnia    ??? Stroke ([place])          ROS:  Comprehensive review of systems negative except for as noted above.       Objective:       Meds:  Current Outpatient Medications   Medication Sig Dispense Refill   ??? apixaban (ELIQUIS) 5 mg tablet TAKE 1 TABLET BY MOUTH TWICE A DAY 180 Tab 1   ??? QUEtiapine (SEROQUEL) 25 mg tablet TAKE 1/2 TAB BY MOUTH AT BEDTIME. 45 Tab 1   ??? memantine (NAMENDA) 5 mg tablet Take 1 Tab by mouth two (2) times a day. 60 Tab 5   ??? atorvastatin  (LIPITOR) 20 mg tablet Take 1 Tab by mouth daily. 90 Tab 1   ??? acetaminophen (TYLENOL) 325 mg tablet Take 2 Tabs by mouth every six (6) hours as needed.         Exam:  Visit Vitals  BP 126/64   Pulse 75   Resp 18   Wt 78.5 kg (173 lb)   SpO2 100%   BMI 23.46 kg/m??     NEUROLOGICAL EXAM:  General: Awake, alert, speech fluent.  Oriented to date, place, person.  Knows POTUS.  Intact serial 7's.  MOCA: 23/30 (see scanned media)  CN: PERRL, EOMI without nystagmus, VFF to confrontation, facial sensation and strength are normal and symmetric, hearing is intact to finger rub bilaterally, palate and tongue movements are intact and symmetric.  Motor: Normal tone, bulk and strength bilaterally.  Reflexes: 1/4 and symmetric, plantar stimulation is flexor.  Coordination: FNF, RAM, HTS intact.  Sensation: LT intact throughout.  Gait: Slightly unsteady    LABS  Results for orders placed or performed in visit on 08/02/18   LIPID PANEL   Result Value Ref Range    Cholesterol, total 115 100 - 199  mg/dL    Triglyceride 53 0 - 149 mg/dL    HDL Cholesterol 47 >12 mg/dL    VLDL, calculated 11 5 - 40 mg/dL    LDL, calculated 57 0 - 99 mg/dL   METABOLIC PANEL, COMPREHENSIVE   Result Value Ref Range    Glucose 82 65 - 99 mg/dL    BUN 14 8 - 27 mg/dL    Creatinine 5.55 7.83 - 1.27 mg/dL    GFR est non-AA 73 >05 mL/min/1.73    GFR est AA 85 >59 mL/min/1.73    BUN/Creatinine ratio 15 10 - 24    Sodium 144 134 - 144 mmol/L    Potassium 4.2 3.5 - 5.2 mmol/L    Chloride 108 (H) 96 - 106 mmol/L    CO2 24 20 - 29 mmol/L    Calcium 9.3 8.6 - 10.2 mg/dL    Protein, total 6.4 6.0 - 8.5 g/dL    Albumin 4.2 3.5 - 4.7 g/dL    GLOBULIN, TOTAL 2.2 1.5 - 4.5 g/dL    A-G Ratio 1.9 1.2 - 2.2    Bilirubin, total 1.0 0.0 - 1.2 mg/dL    Alk. phosphatase 71 39 - 117 IU/L    AST (SGOT) 19 0 - 40 IU/L    ALT (SGPT) 15 0 - 44 IU/L   CVD REPORT   Result Value Ref Range    INTERPRETATION Note        IMAGING:  MRI Results (most recent):  Results from Hospital Encounter  encounter on 12/19/17   MRI BRAIN WO CONT    Narrative *PRELIMINARY REPORT*    Punctate foci of diffusion restriction in the left frontal lobe are compatible  with acute small vessel infarcts. No evidence of intracranial hemorrhage or  mass. Mild periventricular and subcortical white matter disease, compatible with  chronic small vessel ischemia.    Preliminary report was provided by Dr. Alessandra Bevels, the on-call radiologist, at 1:30  PM.    Final report to follow.    *END PRELIMINARY REPORT*    EXAM:  MRI BRAIN WO CONT  History: Dysarthria  INDICATION:  Dysarthria    COMPARISON: CTA 12/19/2017.    CONTRAST:  None.    TECHNIQUE: Sagittal T1, axial FLAIR, T2,T1 and gradient echo images as well as  coronal T2 weighted images and axial diffusion weighted images of the head were  obtained.    FINDINGS:      Small less than 5 mm foci of cortically based infarction in the left frontal  lobe. Anterior division MCA vascular territory. No other foci of infarction. No  Chiari or syrinx. Pituitary infundibulum unremarkable. Scattered foci of  increased T2 signal intensity corona radiata and centrum semiovale. Small remote  lacunar infarctions in the cerebral white matter on the right and on the left.  Mucoperiosteal thickening in the left maxillary sinus. Left frontal sinus  disease. Sphenoid sinus disease as well. Trace fluid in the vascular cells.  There is no evidence of mass, hemorrhage or abnormal extra-axial fluid  collection.  Normal appearing flow-voids are present in the vertebral, basilar  and carotid artery systems. The craniocervical junction is normal.     Impression IMPRESSION:     Small acute cortical infarction left MCA M2 anterior division segment.  Mild paranasal sinus disease. Greater on the left.  Mild chronic microvascular ischemic change with additional scattered remote  lacunar infarctions in the cerebral white matter. Mild cerebral atrophy.  Correlates with CTA findings of occluded small left MCA  territory  segmental  branch.                   Assessment:     Encounter Diagnoses     ICD-10-CM ICD-9-CM   1. Cerebrovascular accident (CVA), unspecified mechanism ([place]) I63.9 434.91   2. Paroxysmal atrial fibrillation (HCC) I48.0 427.31   3. 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  He is doing well post discharge from a stroke perspective without residual focal deficits.  Remains on NOAC/Eliquis and statin therapy.    Regarding his memory, MOCA was 23/30 and c/w amnestic MCI with deficits mainly involving delayed recall, very mild executive/visuospatial dysfunction.  He has a home health aid coming in the mornings only.  She has noted that patient is not remembering to eat regularly or take evening medications at times.  We discussed having her assist full time rather than part time.    Will continue titration of Namenda to assist with recall deficits.  He has resumed driving, limiting this to short distances only following OT driving assessment.  I have advised that he pursue formal DMV driving evaluation.    Plan:   Transition to Namenda XR 62m daily  Trial off of Seroquel- will monitor for insomnia or behavioral disturbance off of medication  Cont. Eliquis for stroke prevention  Cont. Statin therapy  Goal SBP<140, LDL<70     Follow-up and Dispositions    ?? Return in about 6 months (around 05/17/2019).         Signed:  ROrson Ape DO  11/15/2018

## 2018-11-15 NOTE — Progress Notes (Signed)
Neurology Clinic Follow up Note    Patient ID:  [name]  [id number]  84 y.o.  [birth year]      [name] is here for follow up today of stroke       Last Appointment With Me:  05/24/18      Interval History:   Pt returns for f/u of aphasia/dysarthria/stroke.  Here for f/u of L MCA small territory infarct, L M2/3 occlusion not amenable to endovascular intervention. ??Mechanism likely cardioembolic in setting of Afib. S/P PPM last hospitalization.    No new deficits/focal weakness/numbness/vision loss.  He reports occasional dizziness/lightheadedness.  He remains on Eliquis for Afib/stroke prevention.  No reported side effects/hemorrhage.    He reports memory is stable/unchanged from last visit.  His caregiver reports he is forgetting to take his evening medications on occasion.  Denies difficulty with finances.  She has noted some occasional repetition at times.  Tolerating Namenda well without side effects.  He has lost some weight since last visit.  His caregiver states he is not eating consistently.  She is there to assist him in the mornings only.    He is currently driving, limited to short distances, not on highways following OT driving assessment.    He is maintained on low dose Seroquel in the evenings.  He denies issues with sleep currently and no reported behavioral concerns.      PMHx/ PSHx/ FHx/ SHx:  Reviewed and unchanged previous visit.   Past Medical History:   Diagnosis Date   ??? Arrhythmia    ??? Arthritis    ??? Chronic pain     RIGHT HIP   ??? Insomnia    ??? Stroke ([place])          ROS:  Comprehensive review of systems negative except for as noted above.       Objective:       Meds:  Current Outpatient Medications   Medication Sig Dispense Refill   ??? apixaban (ELIQUIS) 5 mg tablet TAKE 1 TABLET BY MOUTH TWICE A DAY 180 Tab 1   ??? QUEtiapine (SEROQUEL) 25 mg tablet TAKE 1/2 TAB BY MOUTH AT BEDTIME. 45 Tab 1   ??? memantine (NAMENDA) 5 mg tablet Take 1 Tab by mouth two (2) times a day. 60 Tab 5   ??? atorvastatin  (LIPITOR) 20 mg tablet Take 1 Tab by mouth daily. 90 Tab 1   ??? acetaminophen (TYLENOL) 325 mg tablet Take 2 Tabs by mouth every six (6) hours as needed.         Exam:  Visit Vitals  BP 126/64   Pulse 75   Resp 18   Wt 78.5 kg (173 lb)   SpO2 100%   BMI 23.46 kg/m??     NEUROLOGICAL EXAM:  General: Awake, alert, speech fluent.  Oriented to date, place, person.  Knows POTUS.  Intact serial 7's.  MOCA: 23/30 (see scanned media)  CN: PERRL, EOMI without nystagmus, VFF to confrontation, facial sensation and strength are normal and symmetric, hearing is intact to finger rub bilaterally, palate and tongue movements are intact and symmetric.  Motor: Normal tone, bulk and strength bilaterally.  Reflexes: 1/4 and symmetric, plantar stimulation is flexor.  Coordination: FNF, RAM, HTS intact.  Sensation: LT intact throughout.  Gait: Slightly unsteady    LABS  Results for orders placed or performed in visit on 08/02/18   LIPID PANEL   Result Value Ref Range    Cholesterol, total 115 100 - 199  mg/dL    Triglyceride 53 0 - 149 mg/dL    HDL Cholesterol 47 >39 mg/dL    VLDL, calculated 11 5 - 40 mg/dL    LDL, calculated 57 0 - 99 mg/dL   METABOLIC PANEL, COMPREHENSIVE   Result Value Ref Range    Glucose 82 65 - 99 mg/dL    BUN 14 8 - 27 mg/dL    Creatinine 0.95 0.76 - 1.27 mg/dL    GFR est non-AA 73 >59 mL/min/1.73    GFR est AA 85 >59 mL/min/1.73    BUN/Creatinine ratio 15 10 - 24    Sodium 144 134 - 144 mmol/L    Potassium 4.2 3.5 - 5.2 mmol/L    Chloride 108 (H) 96 - 106 mmol/L    CO2 24 20 - 29 mmol/L    Calcium 9.3 8.6 - 10.2 mg/dL    Protein, total 6.4 6.0 - 8.5 g/dL    Albumin 4.2 3.5 - 4.7 g/dL    GLOBULIN, TOTAL 2.2 1.5 - 4.5 g/dL    A-G Ratio 1.9 1.2 - 2.2    Bilirubin, total 1.0 0.0 - 1.2 mg/dL    Alk. phosphatase 71 39 - 117 IU/L    AST (SGOT) 19 0 - 40 IU/L    ALT (SGPT) 15 0 - 44 IU/L   CVD REPORT   Result Value Ref Range    INTERPRETATION Note        IMAGING:  MRI Results (most recent):  Results from Hospital Encounter  encounter on 12/19/17   MRI BRAIN WO CONT    Narrative *PRELIMINARY REPORT*    Punctate foci of diffusion restriction in the left frontal lobe are compatible  with acute small vessel infarcts. No evidence of intracranial hemorrhage or  mass. Mild periventricular and subcortical white matter disease, compatible with  chronic small vessel ischemia.    Preliminary report was provided by Dr. Sharol Roussel, the on-call radiologist, at 1:30  PM.    Final report to follow.    *END PRELIMINARY REPORT*    EXAM:  MRI BRAIN WO CONT  History: Dysarthria  INDICATION:  Dysarthria    COMPARISON: CTA 12/19/2017.    CONTRAST:  None.    TECHNIQUE: Sagittal T1, axial FLAIR, T2,T1 and gradient echo images as well as  coronal T2 weighted images and axial diffusion weighted images of the head were  obtained.    FINDINGS:      Small less than 5 mm foci of cortically based infarction in the left frontal  lobe. Anterior division MCA vascular territory. No other foci of infarction. No  Chiari or syrinx. Pituitary infundibulum unremarkable. Scattered foci of  increased T2 signal intensity corona radiata and centrum semiovale. Small remote  lacunar infarctions in the cerebral white matter on the right and on the left.  Mucoperiosteal thickening in the left maxillary sinus. Left frontal sinus  disease. Sphenoid sinus disease as well. Trace fluid in the vascular cells.  There is no evidence of mass, hemorrhage or abnormal extra-axial fluid  collection.  Normal appearing flow-voids are present in the vertebral, basilar  and carotid artery systems. The craniocervical junction is normal.     Impression IMPRESSION:     Small acute cortical infarction left MCA M2 anterior division segment.  Mild paranasal sinus disease. Greater on the left.  Mild chronic microvascular ischemic change with additional scattered remote  lacunar infarctions in the cerebral white matter. Mild cerebral atrophy.  Correlates with CTA findings of occluded small left MCA  territory  segmental  branch.                   Assessment:     Encounter Diagnoses     ICD-10-CM ICD-9-CM   1. Cerebrovascular accident (CVA), unspecified mechanism ([place]) I63.9 434.91   2. Paroxysmal atrial fibrillation (HCC) I48.0 427.31   3. 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  He is doing well post discharge from a stroke perspective without residual focal deficits.  Remains on NOAC/Eliquis and statin therapy.    Regarding his memory, MOCA was 23/30 and c/w amnestic MCI with deficits mainly involving delayed recall, very mild executive/visuospatial dysfunction.  He has a home health aid coming in the mornings only.  She has noted that patient is not remembering to eat regularly or take evening medications at times.  We discussed having her assist full time rather than part time.    Will continue titration of Namenda to assist with recall deficits.  He has resumed driving, limiting this to short distances only following OT driving assessment.  I have advised that he pursue formal DMV driving evaluation.    Plan:   Transition to Namenda XR 62m daily  Trial off of Seroquel- will monitor for insomnia or behavioral disturbance off of medication  Cont. Eliquis for stroke prevention  Cont. Statin therapy  Goal SBP<140, LDL<70     Follow-up and Dispositions    ?? Return in about 6 months (around 05/17/2019).         Signed:  ROrson Ape DO  11/15/2018

## 2018-11-24 ENCOUNTER — Ambulatory Visit: Attending: Internal Medicine | Primary: Family

## 2018-11-24 ENCOUNTER — Inpatient Hospital Stay: Admit: 2019-01-25 | Payer: MEDICARE | Primary: Family

## 2018-11-24 ENCOUNTER — Ambulatory Visit: Admit: 2018-11-24 | Discharge: 2018-11-24 | Payer: MEDICARE | Attending: Internal Medicine | Primary: Family

## 2018-11-24 DIAGNOSIS — I4819 Other persistent atrial fibrillation: Secondary | ICD-10-CM

## 2018-11-24 NOTE — Progress Notes (Signed)
His TSH is elevated, please ask him to follow up with his PCP in the next few weeks regarding this.  He may need treatment for this.

## 2018-11-24 NOTE — Progress Notes (Signed)
[facility] [place]: [name]  [phone]    Annual visits with me in December, Dr. Loma [name] in May     HPI: [name], a 84 y.o. year-old who presents for follow up regarding his AFib and CVA in setting of SSS s/p pacemaker.      Has some dizziness with position changes, no syncope  Doesn't drink much water, advised him to begin drinking 5-6 glasses of water daily  Denies any falls   No blood in stool or urine on eliquis   No chest pain or palpitations  No dyspnea with exertion or PND  No LE edema   Walking for exercise, does some stretches       Assessment/Plan:  1. ??Hx of CVA - left frontal lobe with acute infarct on MRI, no hemorrhage or mass, evidence of chronic small vessel ischemia, also found to be in Afib and [facility]  -continue statin  -followed by neurology   2.  Atrial Fib - with significant bradycardia/SSS, s/p placement of dual chamber pacemaker   -last pacer check with normal function but short runs of NSVT, will check BMP, magnesium level, TSH and T4 (last TSH elevated in 1/19)   -CHADS2VASC=5 for age, vascular disease, CVA, continue eliquis 5mg  BID   -he will follow up with me again in 1 year   3. Hx of thrombocytopenia - etiology unknown, workup per hematology    4. Body mass index is 23.25 kg/m??. weight is stable   5. Dizziness with position changes - advised him to begin drinking 5-6 glasses of water daily      Fam hx: no early CAD, Afib, CHF, pacemakers  Soc hx: no tobacco use, one alcoholic beverage nightly     0/985/19 Lexi nuc A fib occRV pacing Frequent PVCsNo ischemia/ infarctLVEF 53%  12/22/17: placement of [employer] dual chamber pacer  TTE 12/21/17 - LVEF 65 % to 70 %, no WMA, RV moderately dilated, dilated RA, mild MR, mild to mod TR    He  has a past medical history of Arrhythmia, Arthritis, Atrial fibrillation (HCC), Chronic pain, Insomnia, Long term current use of anticoagulant therapy, Pacemaker, and Stroke (HCC).    Cardiovascular ROS: negative for - chest pain or dyspnea  on exertion  Respiratory ROS: no cough, shortness of breath, or wheezing  Neurological ROS: no TIA or stroke symptoms  All other systems negative except as above.     PE  Vitals:    11/24/18 1010   BP: 118/60   Pulse: 70   Resp: 16   SpO2: 98%   Weight: 171 lb 3.2 oz (77.7 kg)   Height: 6' (1.829 m)    Body mass index is 23.22 kg/m??.  General appearance - alert, well appearing, and in no distress  Mental status - affect appropriate to mood  Eyes - sclera anicteric, moist mucous membranes  Neck - supple, no carotid bruits   Lymphatics - no lymphadenopathy  Chest - clear to auscultation bilaterally  Heart - normal rate, irregular rhythm, normal S1, S2, no murmurs, rubs, clicks or gallops  Abdomen - soft, nontender, nondistended  Back exam - full range of motion, no tenderness  Neurological - cranial nerves II through XII grossly intact, no focal deficit  Musculoskeletal - no muscular tenderness noted, normal strength  Extremities - peripheral pulses 1+ BLE, no pedal edema  Skin - normal coloration  no rashes    Recent Labs:  Lab Results  Component Value Date/Time    Cholesterol, total 115 08/04/2018 08:10 AM    HDL Cholesterol 47 08/04/2018 08:10 AM    LDL, calculated 57 08/04/2018 08:10 AM    Triglyceride 53 08/04/2018 08:10 AM    CHOL/HDL Ratio 3.1 12/20/2017 05:40 AM     Lab Results   Component Value Date/Time    Creatinine 1.14 11/24/2018 10:55 AM     Lab Results   Component Value Date/Time    BUN 15 11/24/2018 10:55 AM     Lab Results   Component Value Date/Time    Potassium 5.1 11/24/2018 10:55 AM     Lab Results   Component Value Date/Time    Hemoglobin A1c 6.1 12/20/2017 05:40 AM     Lab Results   Component Value Date/Time    HGB 13.9 01/26/2018 03:09 PM     Lab Results   Component Value Date/Time    PLATELET 101 (L) 01/26/2018 03:09 PM       Reviewed:  Past Medical History:   Diagnosis Date   ??? Arrhythmia    ??? Arthritis    ??? Atrial fibrillation (HCC)    ??? Chronic pain     RIGHT HIP   ??? Insomnia    ??? Long  term current use of anticoagulant therapy    ??? Pacemaker    ??? Stroke [facility])      Social History     Tobacco Use   Smoking Status Current Every Day Smoker   ??? Types: Cigars   Smokeless Tobacco Never Used     Social History     Substance and Sexual Activity   Alcohol Use Yes   ??? Frequency: 2-3 times a week    Comment: 2/WEEK VODKA     No Known Allergies    Current Outpatient Medications   Medication Sig   ??? memantine ER (NAMENDA XR) 21 mg capsule Take 1 Cap by mouth daily.   ??? apixaban (ELIQUIS) 5 mg tablet TAKE 1 TABLET BY MOUTH TWICE A DAY   ??? atorvastatin (LIPITOR) 20 mg tablet Take 1 Tab by mouth daily.   ??? acetaminophen (TYLENOL) 325 mg tablet Take 2 Tabs by mouth every six (6) hours as needed.     No current facility-administered medications for this visit.        [name], MD  [facility]  [address]  [place], [place] [address]

## 2018-11-24 NOTE — Progress Notes (Signed)
Patient informed of results by mail.

## 2018-11-24 NOTE — Patient Instructions (Signed)
Please drink 5-6 glasses of water daily   [facility]

## 2018-11-24 NOTE — Progress Notes (Signed)
His TSH is elevated, please ask him to follow up with his PCP in the next few weeks regarding this.  He may need treatment for this.

## 2018-11-24 NOTE — Progress Notes (Signed)
[facility] [place]: [name]  [phone]    Annual visits with me in December, Dr. Loma NewtonNgo in May     HPI: Danny AsalBob D D [name], a 84 y.o. year-old who presents for follow up regarding his AFib and CVA in setting of SSS s/p pacemaker.      Has some dizziness with position changes, no syncope  Doesn't drink much water, advised him to begin drinking 5-6 glasses of water daily  Denies any falls   No blood in stool or urine on eliquis   No chest pain or palpitations  No dyspnea with exertion or PND  No LE edema   Walking for exercise, does some stretches       Assessment/Plan:  1. ??Hx of CVA - left frontal lobe with acute infarct on MRI, no hemorrhage or mass, evidence of chronic small vessel ischemia, also found to be in Afib and now on eliquis [facility]  -continue statin  -followed by neurology   2.  Atrial Fib - with significant bradycardia/SSS, s/p placement of dual chamber pacemaker   -last pacer check with normal function but short runs of NSVT, will check BMP, magnesium level, TSH and T4 (last TSH elevated in 1/19)   -CHADS2VASC=5 for age, vascular disease, CVA, continue eliquis 5mg  BID   -he will follow up with me again in 1 year   3. Hx of thrombocytopenia - etiology unknown, workup per hematology    4. Body mass index is 23.25 kg/m??. weight is stable   5. Dizziness with position changes - advised him to begin drinking 5-6 glasses of water daily      Fam hx: no early CAD, Afib, CHF, pacemakers  Soc hx: no tobacco use, one alcoholic beverage nightly     0/985/19 Lexi nuc A fib occRV pacing Frequent PVCsNo ischemia/ infarctLVEF 82%  12/22/17: placement of [employer] Jude dual chamber pacer  TTE 12/21/17 - LVEF 65 % to 70 %, no WMA, RV moderately dilated, dilated RA, mild MR, mild to mod TR    He  has a past medical history of Arrhythmia, Arthritis, Atrial fibrillation (HCC), Chronic pain, Insomnia, Long term current use of anticoagulant therapy, Pacemaker, and Stroke (HCC).     Cardiovascular ROS: negative for - chest pain or dyspnea on exertion  Respiratory ROS: no cough, shortness of breath, or wheezing  Neurological ROS: no TIA or stroke symptoms  All other systems negative except as above.     PE  Vitals:    11/24/18 1010   BP: 118/60   Pulse: 70   Resp: 16   SpO2: 98%   Weight: 171 lb 3.2 oz (77.7 kg)   Height: 6' (1.829 m)    Body mass index is 23.22 kg/m??.  General appearance - alert, well appearing, and in no distress  Mental status - affect appropriate to mood  Eyes - sclera anicteric, moist mucous membranes  Neck - supple, no carotid bruits   Lymphatics - no lymphadenopathy  Chest - clear to auscultation bilaterally  Heart - normal rate, irregular rhythm, normal S1, S2, no murmurs, rubs, clicks or gallops  Abdomen - soft, nontender, nondistended  Back exam - full range of motion, no tenderness  Neurological - cranial nerves II through XII grossly intact, no focal deficit  Musculoskeletal - no muscular tenderness noted, normal strength  Extremities - peripheral pulses 1+ BLE, no pedal edema  Skin - normal coloration  no rashes    Recent Labs:  Lab Results  Component Value Date/Time    Cholesterol, total 115 08/04/2018 08:10 AM    HDL Cholesterol 47 08/04/2018 08:10 AM    LDL, calculated 57 08/04/2018 08:10 AM    Triglyceride 53 08/04/2018 08:10 AM    CHOL/HDL Ratio 3.1 12/20/2017 05:40 AM     Lab Results   Component Value Date/Time    Creatinine 1.14 11/24/2018 10:55 AM     Lab Results   Component Value Date/Time    BUN 15 11/24/2018 10:55 AM     Lab Results   Component Value Date/Time    Potassium 5.1 11/24/2018 10:55 AM     Lab Results   Component Value Date/Time    Hemoglobin A1c 6.1 12/20/2017 05:40 AM     Lab Results   Component Value Date/Time    HGB 13.9 01/26/2018 03:09 PM     Lab Results   Component Value Date/Time    PLATELET 101 (L) 01/26/2018 03:09 PM       Reviewed:  Past Medical History:   Diagnosis Date   ??? Arrhythmia    ??? Arthritis     ??? Atrial fibrillation (HCC)    ??? Chronic pain     RIGHT HIP   ??? Insomnia    ??? Long term current use of anticoagulant therapy    ??? Pacemaker    ??? Stroke [facility])      Social History     Tobacco Use   Smoking Status Current Every Day Smoker   ??? Types: Cigars   Smokeless Tobacco Never Used     Social History     Substance and Sexual Activity   Alcohol Use Yes   ??? Frequency: 2-3 times a week    Comment: 2/WEEK VODKA     No Known Allergies    Current Outpatient Medications   Medication Sig   ??? memantine ER (NAMENDA XR) 21 mg capsule Take 1 Cap by mouth daily.   ??? apixaban (ELIQUIS) 5 mg tablet TAKE 1 TABLET BY MOUTH TWICE A DAY   ??? atorvastatin (LIPITOR) 20 mg tablet Take 1 Tab by mouth daily.   ??? acetaminophen (TYLENOL) 325 mg tablet Take 2 Tabs by mouth every six (6) hours as needed.     No current facility-administered medications for this visit.        [name], MD  [facility]  [address]  [place], [place] [address]

## 2018-11-25 LAB — BASIC METABOLIC PANEL
BUN: 15 mg/dL (ref 8–27)
Bun/Cre Ratio: 13 NA (ref 10–24)
CO2: 28 mmol/L (ref 20–29)
Calcium: 9.9 mg/dL (ref 8.6–10.2)
Chloride: 101 mmol/L (ref 96–106)
Creatinine: 1.14 mg/dL (ref 0.76–1.27)
EGFR IF NonAfrican American: 59 mL/min/{1.73_m2} — ABNORMAL LOW (ref >59)
GFR African American: 68 mL/min/{1.73_m2} (ref >59)
Glucose: 89 mg/dL (ref 65–99)
Potassium: 5.1 mmol/L (ref 3.5–5.2)
Sodium: 143 mmol/L (ref 134–144)

## 2018-11-25 LAB — TSH 3RD GENERATION
TSH: 9.27 u[IU]/mL — ABNORMAL HIGH (ref 0.450–4.500)
TSH: 9.27 u[IU]/mL — ABNORMAL HIGH (ref 0.450–4.500)

## 2018-11-25 LAB — CKD REPORT

## 2018-11-25 LAB — T4: T4, Total: 5.3 ug/dL (ref 4.5–12.0)

## 2018-11-25 LAB — MAGNESIUM
Magnesium: 2.3 mg/dL (ref 1.6–2.3)
Magnesium: 2.3 mg/dL (ref 1.6–2.3)

## 2018-11-25 LAB — T4 (THYROXINE): T4, Total: 5.3 ug/dL (ref 4.5–12.0)

## 2018-11-25 LAB — METABOLIC PANEL, BASIC
BUN/Creatinine ratio: 13 (ref 10–24)
BUN: 15 mg/dL (ref 8–27)
CO2: 28 mmol/L (ref 20–29)
Calcium: 9.9 mg/dL (ref 8.6–10.2)
Chloride: 101 mmol/L (ref 96–106)
Creatinine: 1.14 mg/dL (ref 0.76–1.27)
GFR est AA: 68 mL/min/{1.73_m2} (ref >59)
GFR est non-AA: 59 mL/min/{1.73_m2} — ABNORMAL LOW (ref >59)
Glucose: 89 mg/dL (ref 65–99)
Potassium: 5.1 mmol/L (ref 3.5–5.2)
Sodium: 143 mmol/L (ref 134–144)

## 2018-12-17 ENCOUNTER — Ambulatory Visit: Attending: Family | Primary: Family

## 2018-12-17 ENCOUNTER — Ambulatory Visit: Admit: 2018-12-17 | Discharge: 2018-12-17 | Payer: MEDICARE | Attending: Family | Primary: Family

## 2018-12-17 DIAGNOSIS — E039 Hypothyroidism, unspecified: Secondary | ICD-10-CM

## 2018-12-17 MED ORDER — LEVOTHYROXINE 50 MCG TAB
50 mcg | ORAL_TABLET | Freq: Every day | ORAL | 2 refills | Status: DC
Start: 2018-12-17 — End: 2019-01-09

## 2018-12-17 NOTE — Progress Notes (Signed)
HISTORY OF PRESENT ILLNESS  [name] is a 85 y.o. male.  HPI: Patient had elevate TSH at cardiologist office and was referred to pcp to start on synthroid. He has histroy of arrhythmia, atrial fib/pace maker, stoke, insomnia. He is accompanied by care taker who visits him twice a week. He is also followed by nerurologist.  Due for flu vaccine  Past Medical History:   Diagnosis Date   ??? Arrhythmia    ??? Arthritis    ??? Atrial fibrillation (HCC)    ??? Chronic pain     RIGHT HIP   ??? Dementia (HCC)    ??? Hypercholesterolemia    ??? Insomnia    ??? Long term current use of anticoagulant therapy    ??? Pacemaker    ??? Stroke [facility])        Past Surgical History:   Procedure Laterality Date   ??? ABDOMEN SURGERY PROC UNLISTED     ??? HX PACEMAKER     No Known Allergies    Current Outpatient Medications:   ???  levothyroxine (SYNTHROID) 50 mcg tablet, Take 1 Tab by mouth Daily (before breakfast)., Disp: 30 Tab, Rfl: 2  ???  memantine ER (NAMENDA XR) 21 mg capsule, Take 1 Cap by mouth daily., Disp: 90 Cap, Rfl: 1  ???  apixaban (ELIQUIS) 5 mg tablet, TAKE 1 TABLET BY MOUTH TWICE A DAY, Disp: 180 Tab, Rfl: 1  ???  atorvastatin (LIPITOR) 20 mg tablet, Take 1 Tab by mouth daily., Disp: 90 Tab, Rfl: 1  ???  acetaminophen (TYLENOL) 325 mg tablet, Take 2 Tabs by mouth every six (6) hours as needed., Disp: , Rfl:   Review of Systems   Constitutional: Negative.    Respiratory: Negative.    Cardiovascular: Negative.    Gastrointestinal: Negative.      Blood pressure 130/70, pulse 90, temperature 97.3 ??F (36.3 ??C), temperature source Oral, height 6' (1.829 m), weight 175 lb 6.4 oz (79.6 kg), SpO2 97 %.    Physical Exam  Constitutional:       Appearance: Normal appearance. He is normal weight.   HENT:      Mouth/Throat:      Mouth: Mucous membranes are moist.      Pharynx: Oropharynx is clear.   Neck:      Musculoskeletal: Normal range of motion and neck supple.   Cardiovascular:      Rate and Rhythm: Normal rate and regular rhythm.      Heart sounds: No  murmur.   Pulmonary:      Effort: Pulmonary effort is normal.      Breath sounds: Normal breath sounds.   Abdominal:      General: Bowel sounds are normal.      Palpations: Abdomen is soft.   Neurological:      Mental Status: He is alert.         ASSESSMENT and PLAN  Diagnoses and all orders for this visit:    1. Acquired hypothyroidism  -     TSH 3RD GENERATION  -     T4, FREE  -    start levothyroxine (SYNTHROID) 50 mcg tablet; Take 1 Tab by mouth Daily (before breakfast).    2. Encounter for immunization  -     INFLUENZA VIRUS VAC QUAD,SPLIT,PRESV FREE SYRINGE IM  -     ADMIN INFLUENZA VIRUS VAC    Follow up in 8 weeks in lab to check TSH and free T4  Pt was given  an after visit summary which includes diagnosis, current medicines and vital and voiced understanding of treatment plan

## 2018-12-17 NOTE — Progress Notes (Signed)
 Chief Complaint   Patient presents with   . Thyroid Problem     f/u     1. Have you been to the ER, urgent care clinic since your last visit?  Hospitalized since your last visit?No    2. Have you seen or consulted any other health care providers outside of the [facility] since your last visit?  Include any pap smears or colon screening. No    BRENNER VISCONTI is a 85 y.o. male  who presents for routine immunizations.   he denies any symptoms , reactions or allergies that would exclude them from being immunized today.  Risks and adverse reactions were discussed and the VIS was given to them. All questions were addressed.  he was observed for 10 min post injection. There were no reactions observed.    Luciana JAYSON Louder, LPN

## 2018-12-17 NOTE — Progress Notes (Signed)
HISTORY OF PRESENT ILLNESS  [name] is a 85 y.o. male.  HPI: Patient had elevate TSH at cardiologist office and was referred to pcp to start on synthroid. He has histroy of arrhythmia, atrial fib/pace maker, stoke, insomnia. He is accompanied by care taker who visits him twice a week. He is also followed by nerurologist.  Due for flu vaccine  Past Medical History:   Diagnosis Date   ??? Arrhythmia    ??? Arthritis    ??? Atrial fibrillation (HCC)    ??? Chronic pain     RIGHT HIP   ??? Dementia (HCC)    ??? Hypercholesterolemia    ??? Insomnia    ??? Long term current use of anticoagulant therapy    ??? Pacemaker    ??? Stroke [facility])        Past Surgical History:   Procedure Laterality Date   ??? ABDOMEN SURGERY PROC UNLISTED     ??? HX PACEMAKER     No Known Allergies    Current Outpatient Medications:   ???  levothyroxine (SYNTHROID) 50 mcg tablet, Take 1 Tab by mouth Daily (before breakfast)., Disp: 30 Tab, Rfl: 2  ???  memantine ER (NAMENDA XR) 21 mg capsule, Take 1 Cap by mouth daily., Disp: 90 Cap, Rfl: 1  ???  apixaban (ELIQUIS) 5 mg tablet, TAKE 1 TABLET BY MOUTH TWICE A DAY, Disp: 180 Tab, Rfl: 1  ???  atorvastatin (LIPITOR) 20 mg tablet, Take 1 Tab by mouth daily., Disp: 90 Tab, Rfl: 1  ???  acetaminophen (TYLENOL) 325 mg tablet, Take 2 Tabs by mouth every six (6) hours as needed., Disp: , Rfl:   Review of Systems   Constitutional: Negative.    Respiratory: Negative.    Cardiovascular: Negative.    Gastrointestinal: Negative.      Blood pressure 130/70, pulse 90, temperature 97.3 ??F (36.3 ??C), temperature source Oral, height 6' (1.829 m), weight 175 lb 6.4 oz (79.6 kg), SpO2 97 %.    Physical Exam  Constitutional:       Appearance: Normal appearance. He is normal weight.   HENT:      Mouth/Throat:      Mouth: Mucous membranes are moist.      Pharynx: Oropharynx is clear.   Neck:      Musculoskeletal: Normal range of motion and neck supple.   Cardiovascular:      Rate and Rhythm: Normal rate and regular rhythm.       Heart sounds: No murmur.   Pulmonary:      Effort: Pulmonary effort is normal.      Breath sounds: Normal breath sounds.   Abdominal:      General: Bowel sounds are normal.      Palpations: Abdomen is soft.   Neurological:      Mental Status: He is alert.         ASSESSMENT and PLAN  Diagnoses and all orders for this visit:    1. Acquired hypothyroidism  -     TSH 3RD GENERATION  -     T4, FREE  -    start levothyroxine (SYNTHROID) 50 mcg tablet; Take 1 Tab by mouth Daily (before breakfast).    2. Encounter for immunization  -     INFLUENZA VIRUS VAC QUAD,SPLIT,PRESV FREE SYRINGE IM  -     ADMIN INFLUENZA VIRUS VAC    Follow up in 8 weeks in lab to check TSH and free T4  Pt was given  an after visit summary which includes diagnosis, current medicines and vital and voiced understanding of treatment plan

## 2018-12-17 NOTE — Progress Notes (Signed)
Chief Complaint   Patient presents with   ??? Thyroid Problem     f/u     1. Have you been to the ER, urgent care clinic since your last visit?  Hospitalized since your last visit?No    2. Have you seen or consulted any other health care providers outside of the [facility] since your last visit?  Include any pap smears or colon screening. No    [name] is a 86 y.o. male  who presents for routine immunizations.   he denies any symptoms , reactions or allergies that would exclude them from being immunized today.  Risks and adverse reactions were discussed and the VIS was given to them. All questions were addressed.  he was observed for 10 min post injection. There were no reactions observed.    Maryama Kuriakose C Jodine Muchmore, LPN

## 2018-12-17 NOTE — Patient Instructions (Signed)
Vaccine Information Statement    Influenza (Flu) Vaccine (Inactivated or Recombinant): What You Need to Know    Many Vaccine Information Statements are available in Spanish and other languages. See www.immunize.org/vis  Hojas de informaci??n sobre vacunas est??n disponibles en espa??ol y en muchos otros idiomas. Visite www.immunize.org/vis    1. Why get vaccinated?    Influenza vaccine can prevent influenza (flu).    Flu is a contagious disease that spreads around the [place] every year, usually between October and May. Anyone can get the flu, but it is more dangerous for some people. Infants and young children, people 65 years of age and older, pregnant women, and people with certain health conditions or a weakened immune system are at greatest risk of flu complications.    Pneumonia, bronchitis, sinus infections and ear infections are examples of flu-related complications. If you have a medical condition, such as heart disease, cancer or diabetes, flu can make it worse.    Flu can cause fever and chills, sore throat, muscle aches, fatigue, cough, headache, and runny or stuffy nose. Some people may have vomiting and diarrhea, though this is more common in children than adults.     Each year thousands of people in the [place] die from flu, and many more are hospitalized. Flu vaccine prevents millions of illnesses and flu-related visits to the doctor each year.    2. Influenza vaccines     CDC recommends everyone 6 months of age and older get vaccinated every flu season. Children 6 months through 8 years of age may need 2 doses during a single flu season.  Everyone else needs only 1 dose each flu season.    It takes about 2 weeks for protection to develop after vaccination.    There are many flu viruses, and they are always changing. Each year a new flu vaccine is made to protect against three or four viruses that are likely to cause disease in the upcoming flu season. Even when the vaccine  doesn???t exactly match these viruses, it may still provide some protection.     Influenza vaccine does not cause flu.    Influenza vaccine may be given at the same time as other vaccines.    3. Talk with your health care provider    Tell your vaccine provider if the person getting the vaccine:  ??? Has had an allergic reaction after a previous dose of influenza vaccine, or has any severe, life-threatening allergies.   ??? Has ever had Guillain-Barr?? Syndrome (also called GBS).    In some cases, your health care provider may decide to postpone influenza vaccination to a future visit.    People with minor illnesses, such as a cold, may be vaccinated. People who are moderately or severely ill should usually wait until they recover before getting influenza vaccine.    Your health care provider can give you more information.    4. Risks of a reaction    ??? Soreness, redness, and swelling where shot is given, fever, muscle aches, and headache can happen after influenza vaccine.  ??? There may be a very small increased risk of Guillain-Barr?? Syndrome (GBS) after inactivated influenza vaccine (the flu shot).    Young children who get the flu shot along with pneumococcal vaccine (PCV13), and/or DTaP vaccine at the same time might be slightly more likely to have a seizure caused by fever. Tell your health care provider if a child who is getting flu vaccine has ever had a   seizure.    People sometimes faint after medical procedures, including vaccination. Tell your provider if you feel dizzy or have vision changes or ringing in the ears.    As with any medicine, there is a very remote chance of a vaccine causing a severe allergic reaction, other serious injury, or death.    5. What if there is a serious problem?    An allergic reaction could occur after the vaccinated person leaves the clinic. If you see signs of a severe allergic reaction (hives, swelling of the face and throat, difficulty breathing, a fast heartbeat, dizziness, or  weakness), call 9-1-1 and get the person to the nearest hospital.    For other signs that concern you, call your health care provider.    Adverse reactions should be reported to the Vaccine Adverse Event Reporting System (VAERS). Your health care provider will usually file this report, or you can do it yourself. Visit the VAERS website at www.vaers.hhs.gov or call 1-800-822-7967.  VAERS is only for reporting reactions, and VAERS staff do not give medical advice.    6. The National Vaccine Injury Compensation Program    The National Vaccine Injury Compensation Program (VICP) is a federal program that was created to compensate people who may have been injured by certain vaccines. Visit the VICP website at www.hrsa.gov/vaccinecompensation or call 1-800-338-2382 to learn about the program and about filing a claim. There is a time limit to file a claim for compensation.    7. How can I learn more?    ??? Ask your health care provider.   ??? Call your local or state health department.  ??? Contact the Centers for Disease Control and Prevention (CDC):  - Call 1-800-232-4636 (1-800-CDC-INFO) or  - Visit CDC???s influenza website at www.cdc.gov/flu    Vaccine Information Statement (Interim)  Inactivated Influenza Vaccine   07/29/2018  42 U.S.C. ?? 300aa-26   Department of Health and Human Services  Centers for Disease Control and Prevention    Office Use Only

## 2018-12-22 ENCOUNTER — Ambulatory Visit: Attending: Family | Primary: Family

## 2018-12-22 ENCOUNTER — Ambulatory Visit: Admit: 2018-12-22 | Payer: MEDICARE | Attending: Family | Primary: Family

## 2018-12-22 DIAGNOSIS — R21 Rash and other nonspecific skin eruption: Secondary | ICD-10-CM

## 2018-12-22 MED ORDER — PREDNISONE 20 MG TAB
20 mg | ORAL_TABLET | ORAL | 0 refills | Status: DC
Start: 2018-12-22 — End: 2019-01-21

## 2018-12-22 NOTE — Progress Notes (Signed)
HISTORY OF PRESENT ILLNESS  [name] is a 85 y.o. male.  HPI: Patient reports he had a rash with itching after receiving flu vaccine. Denies pain  Past Medical History:   Diagnosis Date   ??? Arrhythmia    ??? Arthritis    ??? Atrial fibrillation (HCC)    ??? Chronic pain     RIGHT HIP   ??? Dementia (HCC)    ??? Hypercholesterolemia    ??? Insomnia    ??? Long term current use of anticoagulant therapy    ??? Pacemaker    ??? Stroke [facility])      Past Surgical History:   Procedure Laterality Date   ??? ABDOMEN SURGERY PROC UNLISTED     ??? HX PACEMAKER     No Known Allergies    Current Outpatient Medications:   ???  predniSONE (DELTASONE) 20 mg tablet, Take 1 pill 3 times x day x 4 day, then 1 pill twice x 3 days , then 1 pil daily x 3 days, Disp: 21 Tab, Rfl: 0  ???  levothyroxine (SYNTHROID) 50 mcg tablet, Take 1 Tab by mouth Daily (before breakfast)., Disp: 30 Tab, Rfl: 2  ???  memantine ER (NAMENDA XR) 21 mg capsule, Take 1 Cap by mouth daily., Disp: 90 Cap, Rfl: 1  ???  apixaban (ELIQUIS) 5 mg tablet, TAKE 1 TABLET BY MOUTH TWICE A DAY, Disp: 180 Tab, Rfl: 1  ???  atorvastatin (LIPITOR) 20 mg tablet, Take 1 Tab by mouth daily., Disp: 90 Tab, Rfl: 1  ???  acetaminophen (TYLENOL) 325 mg tablet, Take 2 Tabs by mouth every six (6) hours as needed., Disp: , Rfl:   Review of Systems   Constitutional: Negative.    Respiratory: Negative.    Cardiovascular: Negative.    Gastrointestinal: Negative.    Skin: Positive for itching and rash.     Blood pressure 120/70, pulse (!) 56, temperature 97.4 ??F (36.3 ??C), temperature source Oral, resp. rate 18, height 6' (1.829 m), weight 172 lb 9.6 oz (78.3 kg), SpO2 99 %.    Physical Exam  Constitutional:       Appearance: Normal appearance. He is normal weight.   HENT:      Mouth/Throat:      Mouth: Mucous membranes are moist.      Pharynx: Oropharynx is clear.   Neck:      Musculoskeletal: Normal range of motion and neck supple.   Cardiovascular:      Rate and Rhythm: Normal rate and regular rhythm.      Heart  sounds: No murmur.   Pulmonary:      Effort: Pulmonary effort is normal.      Breath sounds: Normal breath sounds.   Abdominal:      General: Bowel sounds are normal.      Palpations: Abdomen is soft.   Skin:     Findings: Erythema and rash present.      Comments: Erythematous rash on his chest, back and left arm   Neurological:      Mental Status: He is alert.         ASSESSMENT and PLAN  Diagnoses and all orders for this visit:    1. Rash  -     predniSONE (DELTASONE) 20 mg tablet; Take 1 pill 3 times x day x 4 day, then 1 pill twice x 3 days , then 1 pil daily x 3 days        -  Advised to take benadryl for itching  Follow up if not improved  Pt was given an after visit summary which includes diagnosis, current medicines and vital and voiced understanding of treatment plan

## 2018-12-22 NOTE — Progress Notes (Signed)
 Chief Complaint   Patient presents with   . Rash     Chest, left arm and back. Noticed after flu shot.     1. Have you been to the ER, urgent care clinic since your last visit?  Hospitalized since your last visit?No    2. Have you seen or consulted any other health care providers outside of the [facility] since your last visit?  Include any pap smears or colon screening. No

## 2018-12-22 NOTE — Progress Notes (Signed)
HISTORY OF PRESENT ILLNESS  [name] is a 85 y.o. male.  HPI: Patient reports he had a rash with itching after receiving flu vaccine. Denies pain  Past Medical History:   Diagnosis Date   ??? Arrhythmia    ??? Arthritis    ??? Atrial fibrillation (HCC)    ??? Chronic pain     RIGHT HIP   ??? Dementia (HCC)    ??? Hypercholesterolemia    ??? Insomnia    ??? Long term current use of anticoagulant therapy    ??? Pacemaker    ??? Stroke [name])      Past Surgical History:   Procedure Laterality Date   ??? ABDOMEN SURGERY PROC UNLISTED     ??? HX PACEMAKER     No Known Allergies    Current Outpatient Medications:   ???  predniSONE (DELTASONE) 20 mg tablet, Take 1 pill 3 times x day x 4 day, then 1 pill twice x 3 days , then 1 pil daily x 3 days, Disp: 21 Tab, Rfl: 0  ???  levothyroxine (SYNTHROID) 50 mcg tablet, Take 1 Tab by mouth Daily (before breakfast)., Disp: 30 Tab, Rfl: 2  ???  memantine ER (NAMENDA XR) 21 mg capsule, Take 1 Cap by mouth daily., Disp: 90 Cap, Rfl: 1  ???  apixaban (ELIQUIS) 5 mg tablet, TAKE 1 TABLET BY MOUTH TWICE A DAY, Disp: 180 Tab, Rfl: 1  ???  atorvastatin (LIPITOR) 20 mg tablet, Take 1 Tab by mouth daily., Disp: 90 Tab, Rfl: 1  ???  acetaminophen (TYLENOL) 325 mg tablet, Take 2 Tabs by mouth every six (6) hours as needed., Disp: , Rfl:   Review of Systems   Constitutional: Negative.    Respiratory: Negative.    Cardiovascular: Negative.    Gastrointestinal: Negative.    Skin: Positive for itching and rash.     Blood pressure 120/70, pulse (!) 56, temperature 97.4 ??F (36.3 ??C), temperature source Oral, resp. rate 18, height 6' (1.829 m), weight 172 lb 9.6 oz (78.3 kg), SpO2 99 %.    Physical Exam  Constitutional:       Appearance: Normal appearance. He is normal weight.   HENT:      Mouth/Throat:      Mouth: Mucous membranes are moist.      Pharynx: Oropharynx is clear.   Neck:      Musculoskeletal: Normal range of motion and neck supple.   Cardiovascular:      Rate and Rhythm: Normal rate and regular rhythm.       Heart sounds: No murmur.   Pulmonary:      Effort: Pulmonary effort is normal.      Breath sounds: Normal breath sounds.   Abdominal:      General: Bowel sounds are normal.      Palpations: Abdomen is soft.   Skin:     Findings: Erythema and rash present.      Comments: Erythematous rash on his chest, back and left arm   Neurological:      Mental Status: He is alert.         ASSESSMENT and PLAN  Diagnoses and all orders for this visit:    1. Rash  -     predniSONE (DELTASONE) 20 mg tablet; Take 1 pill 3 times x day x 4 day, then 1 pill twice x 3 days , then 1 pil daily x 3 days        -  Advised to take benadryl for itching  Follow up if not improved  Pt was given an after visit summary which includes diagnosis, current medicines and vital and voiced understanding of treatment plan

## 2018-12-22 NOTE — Progress Notes (Signed)
Chief Complaint   Patient presents with   ??? Rash     Chest, left arm and back. Noticed after flu shot.     1. Have you been to the ER, urgent care clinic since your last visit?  Hospitalized since your last visit?No    2. Have you seen or consulted any other health care providers outside of the [facility] since your last visit?  Include any pap smears or colon screening. No

## 2019-01-05 ENCOUNTER — Encounter

## 2019-01-05 MED ORDER — ATORVASTATIN 20 MG TAB
20 mg | ORAL_TABLET | ORAL | 1 refills | Status: DC
Start: 2019-01-05 — End: 2020-04-27

## 2019-01-08 ENCOUNTER — Encounter

## 2019-01-09 MED ORDER — LEVOTHYROXINE 50 MCG TAB
50 mcg | ORAL_TABLET | ORAL | 2 refills | Status: DC
Start: 2019-01-09 — End: 2019-03-28

## 2019-01-21 ENCOUNTER — Ambulatory Visit: Attending: Family | Primary: Family

## 2019-01-21 ENCOUNTER — Ambulatory Visit: Admit: 2019-01-21 | Discharge: 2019-01-21 | Payer: MEDICARE | Attending: Family | Primary: Family

## 2019-01-21 DIAGNOSIS — R21 Rash and other nonspecific skin eruption: Secondary | ICD-10-CM

## 2019-01-21 MED ORDER — PREDNISONE 20 MG TAB
20 mg | ORAL_TABLET | ORAL | 0 refills | Status: DC
Start: 2019-01-21 — End: 2019-05-18

## 2019-01-21 NOTE — Progress Notes (Signed)
 Chief Complaint   Patient presents with   . Rash     Patient states rash is now on legs.      1. Have you been to the ER, urgent care clinic since your last visit?  Hospitalized since your last visit?No    2. Have you seen or consulted any other health care providers outside of the [facility] since your last visit?  Include any pap smears or colon screening. No

## 2019-01-21 NOTE — Progress Notes (Signed)
HISTORY OF PRESENT ILLNESS  [name] is a 85 y.o. male.  HPI: Patient is following on his rash on his chest, rash is now spearing to his legs. He states that the rash came after he got flu shot at that time he was also started on thyroid medication. He is not sure if flu shot or thyroid medication caused him rash   He took a course of prednisone which help a little but after finishing medication, rash came back. Will add flu on his allergy list today.  Patient is followed by dermatologist for multiple skin lesions    Past Medical History:   Diagnosis Date   ??? Arrhythmia    ??? Arthritis    ??? Atrial fibrillation (HCC)    ??? Chronic pain     RIGHT HIP   ??? Dementia (HCC)    ??? Hypercholesterolemia    ??? Insomnia    ??? Long term current use of anticoagulant therapy    ??? Pacemaker    ??? Stroke [facility])      Past Surgical History:   Procedure Laterality Date   ??? ABDOMEN SURGERY PROC UNLISTED     ??? HX PACEMAKER       Allergies   Allergen Reactions   ??? Flu Vaccine Qs2014-15(56mo,Up) Rash       Current Outpatient Medications:   ???  predniSONE (DELTASONE) 20 mg tablet, Take 1 pill 3 times x day x 3 day, then 1 pill twice x 3 days , then 1 pil daily x 3 days, Disp: 18 Tab, Rfl: 0  ???  levothyroxine (SYNTHROID) 50 mcg tablet, TAKE 1 TABLET BY MOUTH EVERY DAY BEFORE BREAKFAST, Disp: 30 Tab, Rfl: 2  ???  atorvastatin (LIPITOR) 20 mg tablet, TAKE 1 TABLET BY MOUTH EVERY DAY, Disp: 90 Tab, Rfl: 1  ???  memantine ER (NAMENDA XR) 21 mg capsule, Take 1 Cap by mouth daily., Disp: 90 Cap, Rfl: 1  ???  apixaban (ELIQUIS) 5 mg tablet, TAKE 1 TABLET BY MOUTH TWICE A DAY, Disp: 180 Tab, Rfl: 1  ???  acetaminophen (TYLENOL) 325 mg tablet, Take 2 Tabs by mouth every six (6) hours as needed., Disp: , Rfl:   Review of Systems   Constitutional: Negative.    Respiratory: Negative.    Cardiovascular: Negative.    Gastrointestinal: Negative.    Skin: Positive for itching and rash.     Blood pressure 100/60, pulse 75, temperature 97.6 ??F (36.4 ??C), temperature  source Oral, resp. rate 18, height 6' (1.829 m), weight 175 lb (79.4 kg), SpO2 98 %.  Body mass index is 23.73 kg/m??.  Physical Exam  Constitutional:       Appearance: He is normal weight.   HENT:      Mouth/Throat:      Mouth: Mucous membranes are moist.      Pharynx: Oropharynx is clear.   Neck:      Musculoskeletal: Normal range of motion and neck supple.   Cardiovascular:      Rate and Rhythm: Normal rate and regular rhythm.      Pulses: Normal pulses.      Heart sounds: Normal heart sounds. No murmur.   Pulmonary:      Effort: Pulmonary effort is normal.      Breath sounds: Normal breath sounds.   Abdominal:      General: Bowel sounds are normal.      Palpations: Abdomen is soft.   Skin:     General: Skin  is warm and dry.      Findings: Erythema present.      Comments: Macular rash on his trunk and legs  Multiple lesion on his chest and back   Neurological:      Mental Status: He is alert.       ASSESSMENT and PLAN  Diagnoses and all orders for this visit:    1. Rash  -     predniSONE (DELTASONE) 20 mg tablet; Take 1 pill 3 times x day x 3 day, then 1 pill twice x 3 days , then 1 pil daily x 3 days               Stop thyroid medication for now and call back in a week  Pt was given an after visit summary which includes diagnosis, current medicines and vital and voiced understanding of treatment plan

## 2019-01-21 NOTE — Progress Notes (Signed)
Chief Complaint   Patient presents with   ??? Rash     Patient states rash is now on legs.      1. Have you been to the ER, urgent care clinic since your last visit?  Hospitalized since your last visit?No    2. Have you seen or consulted any other health care providers outside of the [facility] since your last visit?  Include any pap smears or colon screening. No

## 2019-01-21 NOTE — Telephone Encounter (Signed)
-----   Message from Frances Furbish sent at 01/21/2019 11:21 AM EST -----  Regarding: NP [place]/ Telephone  Contact: [phone]  Caller's first and last name: [facility]   Reason for call: Pharmacy stated that the pt needs another method to take his medications. Because the pt is very forgetful. Please verify with the pharmacy on this matter. The medication method that needs to be changed is Prednisone 20mg .  Callback required yes/no and why: yes   Best contact number(s): [phone]  Details to clarify the request: n/a

## 2019-01-21 NOTE — Progress Notes (Signed)
HISTORY OF PRESENT ILLNESS  [name] is a 85 y.o. male.  HPI: Patient is following on his rash on his chest, rash is now spearing to his legs. He states that the rash came after he got flu shot at that time he was also started on thyroid medication. He is not sure if flu shot or thyroid medication caused him rash   He took a course of prednisone which help a little but after finishing medication, rash came back. Will add flu on his allergy list today.  Patient is followed by dermatologist for multiple skin lesions    Past Medical History:   Diagnosis Date   ??? Arrhythmia    ??? Arthritis    ??? Atrial fibrillation (HCC)    ??? Chronic pain     RIGHT HIP   ??? Dementia (HCC)    ??? Hypercholesterolemia    ??? Insomnia    ??? Long term current use of anticoagulant therapy    ??? Pacemaker    ??? Stroke [facility])      Past Surgical History:   Procedure Laterality Date   ??? ABDOMEN SURGERY PROC UNLISTED     ??? HX PACEMAKER       Allergies   Allergen Reactions   ??? Flu Vaccine Qs2014-15(56mo,Up) Rash       Current Outpatient Medications:   ???  predniSONE (DELTASONE) 20 mg tablet, Take 1 pill 3 times x day x 3 day, then 1 pill twice x 3 days , then 1 pil daily x 3 days, Disp: 18 Tab, Rfl: 0  ???  levothyroxine (SYNTHROID) 50 mcg tablet, TAKE 1 TABLET BY MOUTH EVERY DAY BEFORE BREAKFAST, Disp: 30 Tab, Rfl: 2  ???  atorvastatin (LIPITOR) 20 mg tablet, TAKE 1 TABLET BY MOUTH EVERY DAY, Disp: 90 Tab, Rfl: 1  ???  memantine ER (NAMENDA XR) 21 mg capsule, Take 1 Cap by mouth daily., Disp: 90 Cap, Rfl: 1  ???  apixaban (ELIQUIS) 5 mg tablet, TAKE 1 TABLET BY MOUTH TWICE A DAY, Disp: 180 Tab, Rfl: 1  ???  acetaminophen (TYLENOL) 325 mg tablet, Take 2 Tabs by mouth every six (6) hours as needed., Disp: , Rfl:   Review of Systems   Constitutional: Negative.    Respiratory: Negative.    Cardiovascular: Negative.    Gastrointestinal: Negative.    Skin: Positive for itching and rash.     Blood pressure 100/60, pulse 75, temperature 97.6 ??F (36.4 ??C),  temperature source Oral, resp. rate 18, height 6' (1.829 m), weight 175 lb (79.4 kg), SpO2 98 %.  Body mass index is 23.73 kg/m??.  Physical Exam  Constitutional:       Appearance: He is normal weight.   HENT:      Mouth/Throat:      Mouth: Mucous membranes are moist.      Pharynx: Oropharynx is clear.   Neck:      Musculoskeletal: Normal range of motion and neck supple.   Cardiovascular:      Rate and Rhythm: Normal rate and regular rhythm.      Pulses: Normal pulses.      Heart sounds: Normal heart sounds. No murmur.   Pulmonary:      Effort: Pulmonary effort is normal.      Breath sounds: Normal breath sounds.   Abdominal:      General: Bowel sounds are normal.      Palpations: Abdomen is soft.   Skin:     General: Skin  is warm and dry.      Findings: Erythema present.      Comments: Macular rash on his trunk and legs  Multiple lesion on his chest and back   Neurological:      Mental Status: He is alert.       ASSESSMENT and PLAN  Diagnoses and all orders for this visit:    1. Rash  -     predniSONE (DELTASONE) 20 mg tablet; Take 1 pill 3 times x day x 3 day, then 1 pill twice x 3 days , then 1 pil daily x 3 days               Stop thyroid medication for now and call back in a week  Pt was given an after visit summary which includes diagnosis, current medicines and vital and voiced understanding of treatment plan

## 2019-01-21 NOTE — Telephone Encounter (Signed)
[facility] is calling stating one the patients family member came in to pick up predniSONE (DELTASONE) 20 mg tablet. Pharmacy was told from the family member the patient can not remember to take three pills a day. Pharmacy wants to know if it can be switched.       .Pharmacy on file verified        Best callback:[phone]  LOV: Friday, January 21, 2019

## 2019-01-21 NOTE — Telephone Encounter (Signed)
NP has spoken to care taker and she will make sure patient take med correctly.

## 2019-02-09 ENCOUNTER — Ambulatory Visit: Primary: Family

## 2019-02-09 ENCOUNTER — Ambulatory Visit: Admit: 2019-02-09 | Discharge: 2019-02-09 | Payer: MEDICARE | Primary: Family

## 2019-02-09 DIAGNOSIS — Z950 Presence of cardiac pacemaker: Secondary | ICD-10-CM

## 2019-02-09 NOTE — Progress Notes (Signed)
See scanned Pacemaker Report in Chart Review.  Chargeable visit.

## 2019-02-14 ENCOUNTER — Encounter: Attending: Family | Primary: Family

## 2019-03-28 ENCOUNTER — Encounter

## 2019-03-28 MED ORDER — LEVOTHYROXINE 50 MCG TAB
50 mcg | ORAL_TABLET | ORAL | 0 refills | Status: DC
Start: 2019-03-28 — End: 2019-07-05

## 2019-04-25 NOTE — Telephone Encounter (Signed)
Left a voice message on 04/25/2019 requesting a return call to convert 05/03/2019 appt with Dr. Ngo to a VV or Phone Call or discuss other options. Please pass call to Angie

## 2019-04-26 NOTE — Telephone Encounter (Signed)
Patient is returning your call.    Phone: [phone]

## 2019-04-26 NOTE — Telephone Encounter (Signed)
Left another voice message on 04/26/2019 requesting a return call to convert 05/03/2019 appt with Dr. Loma Newton to a VV or Phone Call or discuss other options. Please pass call to [facility]

## 2019-04-26 NOTE — Telephone Encounter (Signed)
Returned Mr. Sutter call and converted his in-office appointment with Dr. Loma Newton to a VV on March 19th at 2:30pm.    Patient also advised to send pacer transmission remotely on 04/29/2019.

## 2019-04-29 ENCOUNTER — Ambulatory Visit: Primary: Family

## 2019-04-29 ENCOUNTER — Ambulatory Visit: Admit: 2019-04-29 | Discharge: 2019-04-29 | Payer: MEDICARE | Primary: Family

## 2019-04-29 DIAGNOSIS — Z950 Presence of cardiac pacemaker: Secondary | ICD-10-CM

## 2019-04-29 NOTE — Progress Notes (Signed)
See scanned Pacemaker Report in Chart Review.  Non chargeable visit.

## 2019-04-29 NOTE — Progress Notes (Signed)
See scanned Pacemaker Report in Chart Review.  Non chargeable visit.

## 2019-05-03 ENCOUNTER — Telehealth: Attending: Clinical Cardiac Electrophysiology | Primary: Family

## 2019-05-03 ENCOUNTER — Encounter: Attending: Clinical Cardiac Electrophysiology | Primary: Family

## 2019-05-03 ENCOUNTER — Telehealth: Admit: 2019-05-03 | Payer: MEDICARE | Attending: Clinical Cardiac Electrophysiology | Primary: Family

## 2019-05-03 ENCOUNTER — Encounter: Primary: Family

## 2019-05-03 DIAGNOSIS — Z950 Presence of cardiac pacemaker: Secondary | ICD-10-CM

## 2019-05-03 NOTE — Progress Notes (Signed)
Verified patient with two types of identifiers. Notified patient we will make remote schedule and put in the mail. Patient verbalized understanding and will call with any other questions.

## 2019-05-03 NOTE — Progress Notes (Signed)
Merlin pacemaker remote check instead of in-clinic annual visit due to COVID19. Ventricular sensing and capture threshold is appropriate. Unable to assess atrial sensing and capture threshold since patient is in chronic Afib. Since 02-09-19 there were 1 HVR event recorded, 2 seconds long, EGM suggest NSVT episodes during Afib ( 7 beats ) Max V rate of 177 bpm. Continue follow-up on [email or web address].

## 2019-05-03 NOTE — Progress Notes (Signed)
Progress  Notes by Thurston Pounds, MD at 05/03/19 1430                Author: Thurston Pounds, MD  Service: --  Author Type: Physician       Filed: 05/03/19 1429  Encounter Date: 05/03/2019  Status: Signed          Editor: Thurston Pounds, MD (Physician)                          Cardiac Electrophysiology TELEPHONE VIRTUAL VISIT Note     Pursuant to the emergency declaration under the [facility] Act and the [employer], 1135 waiver authority and the Coronavirus Preparedness and Response Supplemental  Appropriations Act, this Virtual  Visit was conducted, with patient's consent, to reduce the patient's risk of exposure to COVID-19 and provide continuity of care for an established patient.       Services were provided through an audio synchronous discussion virtually to substitute for in-person clinic visit.        Subjective:         [name] is a 85 y.o.  patient who is addressed virtually via synchronous audio call for follow up of St. Jude dual chamber pacemaker (DOI 12/22/2017).        Device check on 04/29/2019 showed proper lead & generator function.  Prior device check on 02/09/2019 showed generator longevity estimated 7 yrs.  RA <1%, RV 79%.  AF 100% since 12/2017.  Since 11/08/2018, single HVR x 2 seconds; EGMs suggest NSVT during  AF (7 beats).      He denies chest pain, palpitations, PND, orthopnea, syncope, or edema.  Occasional dizziness with walking or standing.  Able to walk for exercise.      Anticoagulated with Eliquis, denies bleeding issues.         Previous:   Nuclear stress (05/07/2018): LVEF 53%, negative myocardial perfusion imaging.      Echo (12/21/2017): LVEF 65-70%, no RWMA.  RV mod dilated.  RA dilated.  Mild MR.  Mild to mod TR.   ??   MRI brain (12/19/2017): Punctate foci of diffusion restriction in left frontal lobe compatible with acute small vessel infarcts.  No evidence of intracranial hemorrhage or mass.   ??   CTA head & neck (12/19/2017): No evidence large vessel  occlusion. Small branch post MCA in sylvian fissure on series 2 image 429, gradually tapers to occlusion.      Feels better with LRL 70 bpm.      Frequent PVCs.      Admitted 12/19/2017 with slurred speech, dizziness, & gait abnormalities for months, then acutely worsened & accompanied by right-sided facial droop.  Head CT showed possible acute infarction in posterior left frontal lobe. MRI brain showed acute small  vessel infarcts. New AF noted during admission.      Dr. Dahlia Client is primary cardiologist.   ??????      Problem List   Date Reviewed:  2019-02-17                        Codes  Class  Noted             Tachycardia-bradycardia syndrome [facility])  ICD-10-CM: I49.5   ICD-9-CM: 427.81    12/21/2017                       Paroxysmal atrial fibrillation (HCC)  ICD-10-CM:  I48.0   ICD-9-CM: 427.31    12/21/2017                       Pacemaker  ICD-10-CM: Z95.0   ICD-9-CM: V45.01    12/21/2017          Overview Signed 12/21/2017  6:34 PM by Thurston Pounds, MD            12/22/2017 St Jude dual chamber                                   Acute CVA (cerebrovascular accident) [facility])  ICD-10-CM: I63.9   ICD-9-CM: 434.91    12/19/2017                                  Current Outpatient Medications          Medication  Sig  Dispense  Refill           ?  levothyroxine (SYNTHROID) 50 mcg tablet  TAKE 1 TABLET BY MOUTH EVERY DAY BEFORE BREAKFAST  90 Tab  0     ?  predniSONE (DELTASONE) 20 mg tablet  Take 1 pill 3 times x day x 3 day, then 1 pill twice x 3 days , then 1 pil daily x 3 days  18 Tab  0     ?  atorvastatin (LIPITOR) 20 mg tablet  TAKE 1 TABLET BY MOUTH EVERY DAY  90 Tab  1     ?  memantine ER (NAMENDA XR) 21 mg capsule  Take 1 Cap by mouth daily.  90 Cap  1     ?  apixaban (ELIQUIS) 5 mg tablet  TAKE 1 TABLET BY MOUTH TWICE A DAY  180 Tab  1           ?  acetaminophen (TYLENOL) 325 mg tablet  Take 2 Tabs by mouth every six (6) hours as needed.              Allergies        Allergen  Reactions         ?  Flu Vaccine Qs2014-15(41mo,Up)   Rash          Past Medical History:        Diagnosis  Date         ?  Arrhythmia       ?  Arthritis       ?  Atrial fibrillation (HCC)       ?  Chronic pain            RIGHT HIP         ?  Dementia (HCC)       ?  Hypercholesterolemia       ?  Insomnia       ?  Long term current use of anticoagulant therapy       ?  Pacemaker           ?  Stroke [facility])            Past Surgical History:         Procedure  Laterality  Date          ?  ABDOMEN SURGERY PROC UNLISTED              ?  HX PACEMAKER  Family History         Problem  Relation  Age of Onset          ?  Cancer  Mother                BREAST CA          ?  Heart Disease  Father            ?  Heart Disease  Brother            Social History          Tobacco Use         ?  Smoking status:  Current Every Day Smoker              Types:  Cigars         ?  Smokeless tobacco:  Never Used       Substance Use Topics         ?  Alcohol use:  Yes              Frequency:  2-3 times a week             Comment: 2/WEEK VODKA            Review of Systems:    Constitutional: Negative for fever, chills, weight loss, malaise/fatigue.    HEENT: Negative for nosebleeds, vision changes.    Respiratory: Negative for cough, hemoptysis   Cardiovascular: Negative for chest pain, palpitations, orthopnea, claudication, leg swelling, syncope, and PND. + occasional lightheadedness with position changes.   Gastrointestinal: Negative for nausea, vomiting, diarrhea, blood in stool and melena.    Genitourinary: Negative for dysuria, and hematuria.    Musculoskeletal: Negative for myalgias, arthralgia.    Skin: Negative for rash.    Heme: Does not bleed or bruise easily.    Neurological: Negative for speech change and focal weakness         Objective:     Due to this being a TeleHealth evaluation, many elements of the physical examination are unable to be assessed.          Neuro: A&Ox3, speech clear, answering questions appropriately           Assessment/Plan:                  ICD-10-CM   ICD-9-CM             1.  Cardiac pacemaker in situ  Z95.0  V45.01       2.  Persistent atrial fibrillation (HCC)  I48.19  427.31       3.  Chronic anticoagulation  Z79.01  V58.61       4.  Tachycardia-bradycardia syndrome (HCC)  I49.5  427.81             5.  Hx of completed stroke  Z86.73  V12.54             St. Jude dual chamber pacemaker check on 04/29/2019 showed proper lead & generator function.  Prior device check on 02/09/2019 showed generator longevity  estimated 7 yrs.  RA <1%, RV 79%.  AF 100% since 12/2017.  Since 11/08/2018, single HVR x 2 seconds; EGMs suggest NSVT during AF (7 beats).      Nuclear stress test negative, showed normal LVEF in 04/2018.      PVCs, asymptomatic.      Continue anticoagulation with Eliquis, denies bleeding issues.  Remote pacer checks q 3 months.  EP clinic follow up in 1 year.  Follow up with Dr. Dahlia Client as previously scheduled.        Future Appointments           Date  Time  Provider  Department  Center           05/03/2019   2:30 PM  Thurston Pounds, MD  [facility]  [facility] SCHED     05/18/2019  11:00 AM  Hulan Saas, DO  [facility]  [facility]           11/23/2019   9:40 AM  [name], MD  [facility]  [facility] SCHED        We discussed the expected course, resolution and complications of the diagnosis(es) in detail.  Medication risks, benefits, costs, interactions, and alternatives were discussed as indicated.   I advised him to contact the office if his condition worsens, changes or fails to improve as anticipated. He expressed understanding with the diagnosis(es) and plan.      Patient was made aware and verbalized understanding that an appointment will be scheduled for them for a virtual visit and/or office visit within the above time frame. Patient understanding his/her responsibility to call and change time/date if he/she  so chooses.         Call duration: 5 minutes.         Thank you for involving me in this patient's care and please call with further concerns  or questions.         Juliet Rude, M.D.   Electrophysiology/Cardiology   [facility]   [address]                      [address] [place], [place] [address]                             [place] [address]   [phone]                                        [phone]

## 2019-05-03 NOTE — Progress Notes (Signed)
Merlin pacemaker remote check instead of in-clinic annual visit due to COVID19. Ventricular sensing and capture threshold is appropriate. Unable to assess atrial sensing and capture threshold since patient is in chronic Afib. Since 02-09-19 there were 1 HVR event recorded, 2 seconds long, EGM suggest NSVT episodes during Afib ( 7 beats ) Max V rate of 177 bpm. Continue follow-up on [email or web address].

## 2019-05-03 NOTE — Progress Notes (Signed)
Cardiac Electrophysiology TELEPHONE VIRTUAL VISIT Note   Pursuant to the emergency declaration under the [facility] Act and the [employer], 1135 waiver authority and the [employer] Act, this Virtual  Visit was conducted, with patient's consent, to reduce the patient's risk of exposure to COVID-19 and provide continuity of care for an established patient.     Services were provided through an audio synchronous discussion virtually to substitute for in-person clinic visit.    Subjective:      [name] is a 85 y.o. patient who is addressed virtually via synchronous audio call for follow up of St. Jude dual chamber pacemaker (DOI 12/22/2017).      Device check on 04/29/2019 showed proper lead & generator function.  Prior device check on 02/09/2019 showed generator longevity estimated 7 yrs.  RA <1%, RV 79%.  AF 100% since 12/2017.  Since 11/08/2018, single HVR x 2 seconds; EGMs suggest NSVT during AF (7 beats).    He denies chest pain, palpitations, PND, orthopnea, syncope, or edema.  Occasional dizziness with walking or standing.  Able to walk for exercise.    Anticoagulated with Eliquis, denies bleeding issues.      Previous:  Nuclear stress (05/07/2018): LVEF 53%, negative myocardial perfusion imaging.    Echo (12/21/2017): LVEF 65-70%, no RWMA.  RV mod dilated.  RA dilated.  Mild MR.  Mild to mod TR.  ??  MRI brain (12/19/2017): Punctate foci of diffusion restriction in left frontal lobe compatible with acute small vessel infarcts.  No evidence of intracranial hemorrhage or mass.  ??  CTA head & neck (12/19/2017): No evidence large vessel occlusion. Small branch post MCA in sylvian fissure on series 2 image 429, gradually tapers to occlusion.    Feels better with LRL 70 bpm.    Frequent PVCs.    Admitted 12/19/2017 with slurred speech, dizziness, & gait abnormalities for months, then acutely worsened & accompanied by right-sided facial  droop.  Head CT showed possible acute infarction in posterior left frontal lobe. MRI brain showed acute small vessel infarcts. New AF noted during admission.    Dr. Dahlia Client is primary cardiologist.  ??????  Problem List  Date Reviewed: 2019/01/25          Codes Class Noted    Tachycardia-bradycardia syndrome [facility]) ICD-10-CM: I49.5  ICD-9-CM: 427.81  12/21/2017        Paroxysmal atrial fibrillation (HCC) ICD-10-CM: I48.0  ICD-9-CM: 427.31  12/21/2017        Pacemaker ICD-10-CM: Z95.0  ICD-9-CM: V45.01  12/21/2017    Overview Signed 12/21/2017  6:34 PM by Thurston Pounds, MD     12/22/2017 St Jude dual chamber             Acute CVA (cerebrovascular accident) [name]) ICD-10-CM: I63.9  ICD-9-CM: 434.91  12/19/2017                  Current Outpatient Medications   Medication Sig Dispense Refill   ??? levothyroxine (SYNTHROID) 50 mcg tablet TAKE 1 TABLET BY MOUTH EVERY DAY BEFORE BREAKFAST 90 Tab 0   ??? predniSONE (DELTASONE) 20 mg tablet Take 1 pill 3 times x day x 3 day, then 1 pill twice x 3 days , then 1 pil daily x 3 days 18 Tab 0   ??? atorvastatin (LIPITOR) 20 mg tablet TAKE 1 TABLET BY MOUTH EVERY DAY 90 Tab 1   ??? memantine ER (NAMENDA XR) 21 mg capsule Take 1 Cap by mouth  daily. 90 Cap 1   ??? apixaban (ELIQUIS) 5 mg tablet TAKE 1 TABLET BY MOUTH TWICE A DAY 180 Tab 1   ??? acetaminophen (TYLENOL) 325 mg tablet Take 2 Tabs by mouth every six (6) hours as needed.       Allergies   Allergen Reactions   ??? Flu Vaccine [id number](7711mo,Up) Rash     Past Medical History:   Diagnosis Date   ??? Arrhythmia    ??? Arthritis    ??? Atrial fibrillation (HCC)    ??? Chronic pain     RIGHT HIP   ??? Dementia (HCC)    ??? Hypercholesterolemia    ??? Insomnia    ??? Long term current use of anticoagulant therapy    ??? Pacemaker    ??? Stroke [facility](HCC)      Past Surgical History:   Procedure Laterality Date   ??? ABDOMEN SURGERY PROC UNLISTED     ??? HX PACEMAKER       Family History   Problem Relation Age of Onset   ??? Cancer Mother         BREAST CA   ??? Heart Disease Father     ??? Heart Disease Brother      Social History     Tobacco Use   ??? Smoking status: Current Every Day Smoker     Types: Cigars   ??? Smokeless tobacco: Never Used   Substance Use Topics   ??? Alcohol use: Yes     Frequency: 2-3 times a week     Comment: 2/WEEK VODKA        Review of Systems:   Constitutional: Negative for fever, chills, weight loss, malaise/fatigue.   HEENT: Negative for nosebleeds, vision changes.   Respiratory: Negative for cough, hemoptysis  Cardiovascular: Negative for chest pain, palpitations, orthopnea, claudication, leg swelling, syncope, and PND. + occasional lightheadedness with position changes.  Gastrointestinal: Negative for nausea, vomiting, diarrhea, blood in stool and melena.   Genitourinary: Negative for dysuria, and hematuria.   Musculoskeletal: Negative for myalgias, arthralgia.   Skin: Negative for rash.   Heme: Does not bleed or bruise easily.   Neurological: Negative for speech change and focal weakness     Objective:   Due to this being a TeleHealth evaluation, many elements of the physical examination are unable to be assessed.       Neuro: A&Ox3, speech clear, answering questions appropriately      Assessment/Plan:       ICD-10-CM ICD-9-CM    1. Cardiac pacemaker in situ Z95.0 V45.01    2. Persistent atrial fibrillation (HCC) I48.19 427.31    3. Chronic anticoagulation Z79.01 V58.61    4. Tachycardia-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 427.81    5. Hx of completed stroke Z86.73 V12.54        St. Jude dual chamber pacemaker check on 04/29/2019 showed proper lead & generator function.  Prior device check on 02/09/2019 showed generator longevity estimated 7 yrs.  RA <1%, RV 79%.  AF 100% since 12/2017.  Since 11/08/2018, single HVR x 2 seconds; EGMs suggest NSVT during AF (7 beats).    Nuclear stress test negative, showed normal LVEF in 04/2018.    PVCs, asymptomatic.    Continue anticoagulation with Eliquis, denies bleeding issues.     Remote pacer checks q 3 months.  EP clinic follow up in 1 year.  Follow up with Dr. [name] as previously scheduled.    Future Appointments   Date Time Provider Department Center  05/03/2019  2:30 PM Thurston Pounds, MD [facility] SCHED   05/18/2019 11:00 AM Hulan Saas, DO [facility] SCHED   11/23/2019  9:40 AM Dahlia Client Caleen Jobs, MD [facility] SCHED     We discussed the expected course, resolution and complications of the diagnosis(es) in detail.  Medication risks, benefits, costs, interactions, and alternatives were discussed as indicated.  I advised him to contact the office if his condition worsens, changes or fails to improve as anticipated. He expressed understanding with the diagnosis(es) and plan.    Patient was made aware and verbalized understanding that an appointment will be scheduled for them for a virtual visit and/or office visit within the above time frame. Patient understanding his/her responsibility to call and change time/date if he/she so chooses.      Call duration: 5 minutes.      Thank you for involving me in this patient's care and please call with further concerns or questions.      Juliet Rude, M.D.  Electrophysiology/Cardiology  [facility]  [address]                      [address] [place], [place] [address]                             [place] [address]  [phone]                                        [phone]

## 2019-05-12 NOTE — Telephone Encounter (Signed)
Pt was scheduled to come into the office on 6/3 but changed to a telephone call. He doesn't have a way of doing a VV. Please advise if a telephone call is okay.

## 2019-05-16 NOTE — Telephone Encounter (Signed)
Spoke with patient, informed him Dr.Donaldson was okay with a telephone appointment call for 6/3.

## 2019-05-18 ENCOUNTER — Telehealth: Attending: Neurology | Primary: Family

## 2019-05-18 ENCOUNTER — Telehealth: Admit: 2019-05-18 | Payer: MEDICARE | Attending: Neurology | Primary: Family

## 2019-05-18 ENCOUNTER — Encounter: Attending: Neurology | Primary: Family

## 2019-05-18 DIAGNOSIS — G3184 Mild cognitive impairment, so stated: Secondary | ICD-10-CM

## 2019-05-18 MED ORDER — MEMANTINE 28 MG SPRINKLE ER 24HR CAPSULE
28 mg | ORAL_CAPSULE | Freq: Every day | ORAL | 1 refills | Status: DC
Start: 2019-05-18 — End: 2019-12-28

## 2019-05-18 NOTE — Progress Notes (Signed)
Neurology Clinic Follow up Note    Patient ID:  Danny AsalBob D D Fede  [id number]  85 y.o.  01-20-1932      [name] is here for follow up today of stroke       Last Appointment With Me:  11/15/2018    This is a telemedicine visit that was performed with [facility] and the distant site at patient's home. Verbal consent to participate in video visit was obtained.  The patient was identified by name and date of birth. This visit occurred during the Coronavirus (COVID-19) Public Health Emergency. I discussed with the patient the nature of our telemedicine visits, that:    ??? I would evaluate the patient and recommend diagnostics and treatments based on my assessment  ??? Our sessions are not being recorded and that personal health information is protected  ??? Our team would provide follow up care in person if/when the patient needs it      Interval History:   Telephone encounter for f/u of stroke.  Here for f/u of L MCA small territory infarct, L M2/3 occlusion not amenable to endovascular intervention. ??Mechanism likely cardioembolic in setting of Afib. S/P PPM last hospitalization.    No new deficits/focal weakness/numbness/vision loss.  He remains on Eliquis for Afib/stroke prevention.  No reported side effects/hemorrhage.    He reports memory is stable/unchanged from last visit.  His caregiver reports he is having some difficulty with recall.  He will occasionally forget to take his evening pills.  Denies difficulty with finances. Tolerating Namenda well without side effects.  He denies significant weight loss.   He has caregivers MWF in the mornings only.    He is currently driving, limited to short distances, not on highways following OT driving assessment.    No reported behavioral concerns-off of Seroquel.      PMHx/ PSHx/ FHx/ SHx:  Reviewed and unchanged previous visit.   Past Medical History:   Diagnosis Date   ??? Arrhythmia    ??? Arthritis    ??? Atrial fibrillation (HCC)    ???  Chronic pain     RIGHT HIP   ??? Dementia (HCC)    ??? Hypercholesterolemia    ??? Insomnia    ??? Long term current use of anticoagulant therapy    ??? Pacemaker    ??? Stroke (HCC)          ROS:  Comprehensive review of systems negative except for as noted above.       Objective:       Meds:  Current Outpatient Medications   Medication Sig Dispense Refill   ??? levothyroxine (SYNTHROID) 50 mcg tablet TAKE 1 TABLET BY MOUTH EVERY DAY BEFORE BREAKFAST 90 Tab 0   ??? atorvastatin (LIPITOR) 20 mg tablet TAKE 1 TABLET BY MOUTH EVERY DAY 90 Tab 1   ??? memantine ER (NAMENDA XR) 21 mg capsule Take 1 Cap by mouth daily. 90 Cap 1   ??? apixaban (ELIQUIS) 5 mg tablet TAKE 1 TABLET BY MOUTH TWICE A DAY 180 Tab 1   ??? acetaminophen (TYLENOL) 325 mg tablet Take 2 Tabs by mouth every six (6) hours as needed.         Exam:  Visit Vitals  Wt 79.4 kg (175 lb)   BMI 23.73 kg/m??   Due to this being a TeleHealth evaluation, many elements of the physical examination are unable to be assessed.   NEUROLOGICAL EXAM:  General: Awake, alert,  speech fluent.  No dysarthria. Oriented to month/year, place, person.  Knows POTUS.  Intact serial 7's.    LABS  Results for orders placed or performed in visit on 11/24/18   METABOLIC PANEL, BASIC   Result Value Ref Range    Glucose 89 65 - 99 mg/dL    BUN 15 8 - 27 mg/dL    Creatinine 6.33 3.54 - 1.27 mg/dL    GFR est non-AA 59 (L) >59 mL/min/1.73    GFR est AA 68 >59 mL/min/1.73    BUN/Creatinine ratio 13 10 - 24    Sodium 143 134 - 144 mmol/L    Potassium 5.1 3.5 - 5.2 mmol/L    Chloride 101 96 - 106 mmol/L    CO2 28 20 - 29 mmol/L    Calcium 9.9 8.6 - 10.2 mg/dL   MAGNESIUM   Result Value Ref Range    Magnesium 2.3 1.6 - 2.3 mg/dL   TSH 3RD GENERATION   Result Value Ref Range    TSH 9.270 (H) 0.450 - 4.500 uIU/mL   T4 (THYROXINE)   Result Value Ref Range    T4, Total 5.3 4.5 - 12.0 ug/dL   CKD REPORT   Result Value Ref Range    Interpretation Note        IMAGING:  MRI Results (most recent):  Results from Hospital  Encounter encounter on 12/19/17   MRI BRAIN WO CONT    Narrative *PRELIMINARY REPORT*    Punctate foci of diffusion restriction in the left frontal lobe are compatible  with acute small vessel infarcts. No evidence of intracranial hemorrhage or  mass. Mild periventricular and subcortical white matter disease, compatible with  chronic small vessel ischemia.    Preliminary report was provided by Dr. Alessandra Bevels, the on-call radiologist, at 1:30  PM.    Final report to follow.    *END PRELIMINARY REPORT*    EXAM:  MRI BRAIN WO CONT  History: Dysarthria  INDICATION:  Dysarthria    COMPARISON: CTA 12/19/2017.    CONTRAST:  None.    TECHNIQUE: Sagittal T1, axial FLAIR, T2,T1 and gradient echo images as well as  coronal T2 weighted images and axial diffusion weighted images of the head were  obtained.    FINDINGS:      Small less than 5 mm foci of cortically based infarction in the left frontal  lobe. Anterior division MCA vascular territory. No other foci of infarction. No  Chiari or syrinx. Pituitary infundibulum unremarkable. Scattered foci of  increased T2 signal intensity corona radiata and centrum semiovale. Small remote  lacunar infarctions in the cerebral white matter on the right and on the left.  Mucoperiosteal thickening in the left maxillary sinus. Left frontal sinus  disease. Sphenoid sinus disease as well. Trace fluid in the vascular cells.  There is no evidence of mass, hemorrhage or abnormal extra-axial fluid  collection.  Normal appearing flow-voids are present in the vertebral, basilar  and carotid artery systems. The craniocervical junction is normal.     Impression IMPRESSION:     Small acute cortical infarction left MCA M2 anterior division segment.  Mild paranasal sinus disease. Greater on the left.  Mild chronic microvascular ischemic change with additional scattered remote  lacunar infarctions in the cerebral white matter. Mild cerebral atrophy.  Correlates with CTA findings of occluded small left MCA  territory segmental  branch.                 Assessment:  Encounter Diagnoses     ICD-10-CM ICD-9-CM   1. 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 Cerebrovascular accident (CVA), unspecified mechanism (HCC) I63.9 434.91   3. Paroxysmal atrial fibrillation (HCC) I48.0 18.55      85 year old RHM here for f/u of L MCA embolic appearing small territory infarct, L M2/3 occlusion not amenable to endovascular intervention in the setting of atrial fibrillation diagnosed on prior admission.  He is doing well post discharge from a stroke perspective without residual focal deficits.  Remains on NOAC/Eliquis and statin therapy.      Regarding his memory, MOCA was 23/30 and c/w amnestic MCI with deficits mainly involving delayed recall, very mild executive/visuospatial dysfunction.  He has a home health aid coming 3x weekly.  We again discussed that he may benefit from more frequent visits to assist with medication administration vs pre-packaged medication from the pharmacy.  Will continue titration of Namenda to assist with recall deficits.   Plan:   Increase Namenda XR to  daily  Cont. Eliquis for stroke prevention  Cont. Statin therapy  Goal SBP<140, LDL<70       Follow-up and Dispositions    ?? Return in about 6 months (around 11/17/2019).         Signed:  Hulan Saas, DO  05/18/2019    The duration of this appointment visit was 14 minutes of time with the patient.  At least 50% of this time was spent in counseling, explanation of diagnosis, planning of further management, and coordination of care.

## 2019-05-18 NOTE — Progress Notes (Signed)
Follow up memory loss-patient feels memory is stable.     Care taker feels memory is worse.

## 2019-05-18 NOTE — Progress Notes (Signed)
Neurology Clinic Follow up Note    Patient ID:  [name]  [id number]  85 y.o.  01-20-1932      [name] is here for follow up today of stroke       Last Appointment With Me:  11/15/2018    This is a telemedicine visit that was performed with the originating [facility] and the distant site at patient's home. Verbal consent to participate in video visit was obtained.  The patient was identified by name and date of birth. This visit occurred during the Coronavirus (COVID-19) Public Health Emergency. I discussed with the patient the nature of our telemedicine visits, that:    ??? I would evaluate the patient and recommend diagnostics and treatments based on my assessment  ??? Our sessions are not being recorded and that personal health information is protected  ??? Our team would provide follow up care in person if/when the patient needs it      Interval History:   Telephone encounter for f/u of stroke.  Here for f/u of L MCA small territory infarct, L M2/3 occlusion not amenable to endovascular intervention. ??Mechanism likely cardioembolic in setting of Afib. S/P PPM last hospitalization.    No new deficits/focal weakness/numbness/vision loss.  He remains on Eliquis for Afib/stroke prevention.  No reported side effects/hemorrhage.    He reports memory is stable/unchanged from last visit.  His caregiver reports he is having some difficulty with recall.  He will occasionally forget to take his evening pills.  Denies difficulty with finances. Tolerating Namenda well without side effects.  He denies significant weight loss.   He has caregivers MWF in the mornings only.    He is currently driving, limited to short distances, not on highways following OT driving assessment.    No reported behavioral concerns-off of Seroquel.      PMHx/ PSHx/ FHx/ SHx:  Reviewed and unchanged previous visit.   Past Medical History:   Diagnosis Date   ??? Arrhythmia    ??? Arthritis    ??? Atrial fibrillation (HCC)    ???  Chronic pain     RIGHT HIP   ??? Dementia (HCC)    ??? Hypercholesterolemia    ??? Insomnia    ??? Long term current use of anticoagulant therapy    ??? Pacemaker    ??? Stroke (HCC)          ROS:  Comprehensive review of systems negative except for as noted above.       Objective:       Meds:  Current Outpatient Medications   Medication Sig Dispense Refill   ??? levothyroxine (SYNTHROID) 50 mcg tablet TAKE 1 TABLET BY MOUTH EVERY DAY BEFORE BREAKFAST 90 Tab 0   ??? atorvastatin (LIPITOR) 20 mg tablet TAKE 1 TABLET BY MOUTH EVERY DAY 90 Tab 1   ??? memantine ER (NAMENDA XR) 21 mg capsule Take 1 Cap by mouth daily. 90 Cap 1   ??? apixaban (ELIQUIS) 5 mg tablet TAKE 1 TABLET BY MOUTH TWICE A DAY 180 Tab 1   ??? acetaminophen (TYLENOL) 325 mg tablet Take 2 Tabs by mouth every six (6) hours as needed.         Exam:  Visit Vitals  Wt 79.4 kg (175 lb)   BMI 23.73 kg/m??   Due to this being a TeleHealth evaluation, many elements of the physical examination are unable to be assessed.   NEUROLOGICAL EXAM:  General: Awake, alert,  speech fluent.  No dysarthria. Oriented to month/year, place, person.  Knows POTUS.  Intact serial 7's.    LABS  Results for orders placed or performed in visit on 11/24/18   METABOLIC PANEL, BASIC   Result Value Ref Range    Glucose 89 65 - 99 mg/dL    BUN 15 8 - 27 mg/dL    Creatinine 6.33 3.54 - 1.27 mg/dL    GFR est non-AA 59 (L) >59 mL/min/1.73    GFR est AA 68 >59 mL/min/1.73    BUN/Creatinine ratio 13 10 - 24    Sodium 143 134 - 144 mmol/L    Potassium 5.1 3.5 - 5.2 mmol/L    Chloride 101 96 - 106 mmol/L    CO2 28 20 - 29 mmol/L    Calcium 9.9 8.6 - 10.2 mg/dL   MAGNESIUM   Result Value Ref Range    Magnesium 2.3 1.6 - 2.3 mg/dL   TSH 3RD GENERATION   Result Value Ref Range    TSH 9.270 (H) 0.450 - 4.500 uIU/mL   T4 (THYROXINE)   Result Value Ref Range    T4, Total 5.3 4.5 - 12.0 ug/dL   CKD REPORT   Result Value Ref Range    Interpretation Note        IMAGING:  MRI Results (most recent):  Results from Hospital  Encounter encounter on 12/19/17   MRI BRAIN WO CONT    Narrative *PRELIMINARY REPORT*    Punctate foci of diffusion restriction in the left frontal lobe are compatible  with acute small vessel infarcts. No evidence of intracranial hemorrhage or  mass. Mild periventricular and subcortical white matter disease, compatible with  chronic small vessel ischemia.    Preliminary report was provided by Dr. Alessandra Bevels, the on-call radiologist, at 1:30  PM.    Final report to follow.    *END PRELIMINARY REPORT*    EXAM:  MRI BRAIN WO CONT  History: Dysarthria  INDICATION:  Dysarthria    COMPARISON: CTA 12/19/2017.    CONTRAST:  None.    TECHNIQUE: Sagittal T1, axial FLAIR, T2,T1 and gradient echo images as well as  coronal T2 weighted images and axial diffusion weighted images of the head were  obtained.    FINDINGS:      Small less than 5 mm foci of cortically based infarction in the left frontal  lobe. Anterior division MCA vascular territory. No other foci of infarction. No  Chiari or syrinx. Pituitary infundibulum unremarkable. Scattered foci of  increased T2 signal intensity corona radiata and centrum semiovale. Small remote  lacunar infarctions in the cerebral white matter on the right and on the left.  Mucoperiosteal thickening in the left maxillary sinus. Left frontal sinus  disease. Sphenoid sinus disease as well. Trace fluid in the vascular cells.  There is no evidence of mass, hemorrhage or abnormal extra-axial fluid  collection.  Normal appearing flow-voids are present in the vertebral, basilar  and carotid artery systems. The craniocervical junction is normal.     Impression IMPRESSION:     Small acute cortical infarction left MCA M2 anterior division segment.  Mild paranasal sinus disease. Greater on the left.  Mild chronic microvascular ischemic change with additional scattered remote  lacunar infarctions in the cerebral white matter. Mild cerebral atrophy.  Correlates with CTA findings of occluded small left MCA  territory segmental  branch.                 Assessment:  Encounter Diagnoses     ICD-10-CM ICD-9-CM   1. 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 Cerebrovascular accident (CVA), unspecified mechanism (HCC) I63.9 434.91   3. Paroxysmal atrial fibrillation (HCC) I48.0 427.31      85 year old RHM here for f/u of L MCA embolic appearing small territory infarct, L M2/3 occlusion not amenable to endovascular intervention in the setting of atrial fibrillation diagnosed on prior admission.  He is doing well post discharge from a stroke perspective without residual focal deficits.  Remains on NOAC/Eliquis and statin therapy.      Regarding his memory, MOCA was 23/30 and c/w amnestic MCI with deficits mainly involving delayed recall, very mild executive/visuospatial dysfunction.  He has a home health aid coming 3x weekly.  We again discussed that he may benefit from more frequent visits to assist with medication administration vs pre-packaged medication from the pharmacy.  Will continue titration of Namenda to assist with recall deficits.   Plan:   Increase Namenda XR to 28mg daily  Cont. Eliquis for stroke prevention  Cont. Statin therapy  Goal SBP<140, LDL<70       Follow-up and Dispositions    ?? Return in about 6 months (around 11/17/2019).         Signed:  Brit Wernette M Aylla Huffine, DO  05/18/2019    The duration of this appointment visit was 14 minutes of time with the patient.  At least 50% of this time was spent in counseling, explanation of diagnosis, planning of further management, and coordination of care.

## 2019-05-18 NOTE — Progress Notes (Signed)
Follow up memory loss-patient feels memory is stable.     Care taker feels memory is worse.

## 2019-07-04 ENCOUNTER — Encounter

## 2019-07-05 MED ORDER — LEVOTHYROXINE 50 MCG TAB
50 mcg | ORAL_TABLET | ORAL | 0 refills | Status: DC
Start: 2019-07-05 — End: 2019-09-30

## 2019-07-05 NOTE — Telephone Encounter (Signed)
Needs office visit and blood work

## 2019-08-05 NOTE — Telephone Encounter (Signed)
Patient was left an voice message informing that if he is continuing having shortness of breathe, chest pain, nausea , vomiting, sweating , back pain he needs to go to the ER.  Informed that can return my call on call services will be for non urgent matters if he needs to contact our office but per his symptoms needs to go to ER.  Grier Mitts, LPN

## 2019-08-05 NOTE — Telephone Encounter (Signed)
-----   Message from Frances Furbish sent at 08/05/2019  1:25 PM EDT -----  Regarding: [place]/Telephone  Contact: [phone]  Caller's first and last name and relationship (if not the patient): self  Best contact number(s): [phone]  What are the symptoms: Pt is having heaviness when breathing  Transfer successful - yes/no (include outcome): No  Transfer declined - yes/no (include reason): No  Was caller advised to seek appropriate level of care - yes/no: Yes  Details to clarify the request: n/a

## 2019-08-10 ENCOUNTER — Ambulatory Visit: Primary: Family

## 2019-08-10 ENCOUNTER — Ambulatory Visit: Admit: 2019-08-10 | Discharge: 2019-08-10 | Payer: BLUE CROSS/BLUE SHIELD | Primary: Family

## 2019-08-10 DIAGNOSIS — Z950 Presence of cardiac pacemaker: Secondary | ICD-10-CM

## 2019-08-10 NOTE — Progress Notes (Signed)
See scanned Pacemaker Report in Chart Review.  Chargeable visit.

## 2019-09-29 ENCOUNTER — Encounter

## 2019-09-30 ENCOUNTER — Encounter

## 2019-09-30 MED ORDER — LEVOTHYROXINE 50 MCG TAB
50 mcg | ORAL_TABLET | ORAL | 0 refills | Status: DC
Start: 2019-09-30 — End: 2020-01-13

## 2019-09-30 MED ORDER — APIXABAN 5 MG TABLET
5 mg | ORAL_TABLET | ORAL | 1 refills | Status: DC
Start: 2019-09-30 — End: 2020-05-01

## 2019-09-30 NOTE — Telephone Encounter (Signed)
Needs office visit and blood work

## 2019-09-30 NOTE — Telephone Encounter (Signed)
Last Visit: 01/21/19  NP [place]  Next Appointment: Not scheduled  Previous Refill Encounter(s): 10/29/18  180 + 1    Requested Prescriptions     Pending Prescriptions Disp Refills   ??? apixaban (Eliquis) 5 mg tablet 180 Tab 1     Sig: TAKE 1 TABLET BY MOUTH TWICE A DAY

## 2019-11-14 ENCOUNTER — Ambulatory Visit: Primary: Family

## 2019-11-14 ENCOUNTER — Ambulatory Visit: Admit: 2019-11-14 | Payer: MEDICARE | Primary: Family

## 2019-11-14 DIAGNOSIS — Z950 Presence of cardiac pacemaker: Secondary | ICD-10-CM

## 2019-11-14 NOTE — Progress Notes (Signed)
See scanned Pacemaker Report in Chart Review.  Chargeable visit.

## 2019-11-23 ENCOUNTER — Ambulatory Visit: Attending: Internal Medicine | Primary: Family

## 2019-11-23 ENCOUNTER — Ambulatory Visit: Admit: 2019-11-23 | Discharge: 2019-11-23 | Payer: MEDICARE | Attending: Internal Medicine | Primary: Family

## 2019-11-23 DIAGNOSIS — Z950 Presence of cardiac pacemaker: Secondary | ICD-10-CM

## 2019-11-23 MED ORDER — FLUDROCORTISONE 0.1 MG TAB
0.1 mg | ORAL_TABLET | Freq: Every day | ORAL | 3 refills | Status: DC
Start: 2019-11-23 — End: 2021-01-01

## 2019-11-23 NOTE — Addendum Note (Signed)
Addendum Note by  Janan Halter at 11/23/19 0940                Author: Janan Halter  Service: --  Author Type: Technician       Filed: 03/02/20 0837  Encounter Date: 11/23/2019  Status: Signed          Editor: Janan Halter (Technician)          Addended by: Norval Morton, ANN on: 03/02/2020 08:37 AM    Modules accepted: Orders

## 2019-11-23 NOTE — Progress Notes (Signed)
[facility] [place]: [name]  [phone]    Annual visits with me in December, Dr. Raelene Bott in May     HPI: [name], a 85 y.o. year-old who presents for follow up regarding his AFib and CVA in setting of SSS s/p pacemaker.      Has some dizziness with position changes, no syncope  Doesn't drink much water, advised him to begin drinking 5-6 glasses of water daily  Denies any falls   No blood in stool or urine on eliquis   No chest pain or palpitations  No dyspnea with exertion or PND  No LE edema   Walking for exercise, does some stretches     Still having dizziness some falls. He gets weak all of a sudden if they have been out and about too long.   Like standing too long playing golf.   No cuff a home to track BP  Will add florinef to .1mg  daily for bp support and see if it helps prevent falls.   Labs today for hgb and crt and lipids and mag etc.     Assessment/Plan:  1. ??Hx of CVA - left frontal lobe with acute infarct on MRI, no hemorrhage or mass, evidence of chronic small vessel ischemia, also found to be in Afib and now on eliquis for [facility]  -continue statin  -followed by neurology   2.  Atrial Fib - with significant bradycardia/SSS, s/p placement of dual chamber pacemaker   -last pacer check with normal function but short runs of NSV   -CHADS2VASC=5 for age, vascular disease, CVA, continue eliquis 5mg  BID   -he will follow up with me again in 1 year   3. Hx of thrombocytopenia - etiology unknown, workup per hematology    4. Body mass index is 23.25 kg/m??. weight is stable   5. Dizziness with position changes - advised him to begin drinking 5-6 glasses of water daily      Fam hx: no early CAD, Afib, CHF, pacemakers  Soc hx: no tobacco use, one alcoholic beverage nightly     5/19 Lexi nuc A fib occRV pacing Frequent PVCsNo ischemia/ infarctLVEF 53%  12/22/17: placement of St Jude dual chamber pacer  TTE 12/21/17 - LVEF 65 % to 70 %, no WMA, RV moderately dilated, dilated RA, mild MR, mild to mod TR    He  has a  past medical history of Arrhythmia, Arthritis, Atrial fibrillation ([place]), Chronic pain, Dementia ([place]), Hypercholesterolemia, Insomnia, Long term current use of anticoagulant therapy, Pacemaker, and Stroke ([place]). He also has no past medical history of Hypertension.    Cardiovascular ROS: negative for - chest pain or dyspnea on exertion  Respiratory ROS: no cough, shortness of breath, or wheezing  Neurological ROS: no TIA or stroke symptoms  All other systems negative except as above.     PE  Vitals:    11/23/19 1002   BP: 124/84   Pulse: 80   Resp: 14   SpO2: 97%   Weight: 176 lb (79.8 kg)   Height: 6' (1.829 m)    Body mass index is 23.87 kg/m??.  General appearance - alert, well appearing, and in no distress  Mental status - affect appropriate to mood  Eyes - sclera anicteric, moist mucous membranes  Neck - supple, no carotid bruits   Lymphatics - no lymphadenopathy  Chest - clear to auscultation bilaterally  Heart - normal rate, irregular rhythm, normal S1, S2, no murmurs, rubs, clicks or gallops  Abdomen - soft, nontender, nondistended  Back exam - full range of motion, no tenderness  Neurological - cranial nerves II through XII grossly intact, no focal deficit  Musculoskeletal - no muscular tenderness noted, normal strength  Extremities - peripheral pulses 1+ BLE, no pedal edema  Skin - normal coloration  no rashes    Recent Labs:  Lab Results   Component Value Date/Time    Cholesterol, total 115 08/04/2018 08:10 AM    HDL Cholesterol 47 08/04/2018 08:10 AM    LDL, calculated 57 08/04/2018 08:10 AM    Triglyceride 53 08/04/2018 08:10 AM    CHOL/HDL Ratio 3.1 12/20/2017 05:40 AM     Lab Results   Component Value Date/Time    Creatinine 1.14 11/24/2018 10:55 AM     Lab Results   Component Value Date/Time    BUN 15 11/24/2018 10:55 AM     Lab Results   Component Value Date/Time    Potassium 5.1 11/24/2018 10:55 AM     Lab Results   Component Value Date/Time    Hemoglobin A1c 6.1 12/20/2017 05:40 AM     Lab Results    Component Value Date/Time    HGB 13.9 01/26/2018 03:09 PM     Lab Results   Component Value Date/Time    PLATELET 101 (L) 01/26/2018 03:09 PM       Reviewed:  Past Medical History:   Diagnosis Date   ??? Arrhythmia    ??? Arthritis    ??? Atrial fibrillation (HCC)    ??? Chronic pain     RIGHT HIP   ??? Dementia (HCC)    ??? Hypercholesterolemia    ??? Insomnia    ??? Long term current use of anticoagulant therapy    ??? Pacemaker    ??? Stroke [facility])      Social History     Tobacco Use   Smoking Status Current Every Day Smoker   ??? Types: Cigars   Smokeless Tobacco Never Used     Social History     Substance and Sexual Activity   Alcohol Use Yes   ??? Frequency: 2-3 times a week    Comment: 2/WEEK VODKA     Allergies   Allergen Reactions   ??? Flu Vaccine Qs2014-15(35mo,Up) Rash     States not an allergy 11/23/19       Current Outpatient Medications   Medication Sig   ??? diphenhydrAMINE (Benadryl Allergy) 25 mg tablet Take 25 mg by mouth as needed.   ??? levothyroxine (SYNTHROID) 50 mcg tablet TAKE 1 TABLET BY MOUTH EVERY DAY BEFORE BREAKFAST   ??? apixaban (Eliquis) 5 mg tablet TAKE 1 TABLET BY MOUTH TWICE A DAY   ??? memantine ER (Namenda XR) 28 mg capsule Take 1 Cap by mouth daily.   ??? atorvastatin (LIPITOR) 20 mg tablet TAKE 1 TABLET BY MOUTH EVERY DAY   ??? acetaminophen (TYLENOL) 325 mg tablet Take 2 Tabs by mouth every six (6) hours as needed.     No current facility-administered medications for this visit.        [name], MD  [facility]  [address]  [place], [place] [address]

## 2019-11-23 NOTE — Progress Notes (Signed)
[facility] [place]: [name]  [phone]    Annual visits with me in December, Dr. Raelene [name] in May     HPI: [name], a 85 y.o. year-old who presents for follow up regarding his AFib and CVA in setting of SSS s/p pacemaker.      Has some dizziness with position changes, no syncope  Doesn't drink much water, advised him to begin drinking 5-6 glasses of water daily  Denies any falls   No blood in stool or urine on eliquis   No chest pain or palpitations  No dyspnea with exertion or PND  No LE edema   Walking for exercise, does some stretches     Still having dizziness some falls. He gets weak all of a sudden if they have been out and about too long.   Like standing too long playing golf.   No cuff a home to track BP  Will add florinef to .1mg  daily for bp support and see if it helps prevent falls.   Labs today for hgb and crt and lipids and mag etc.     Assessment/Plan:  1. ??Hx of CVA - left frontal lobe with acute infarct on MRI, no hemorrhage or mass, evidence of chronic small vessel ischemia, also found to be in Afib and now on eliquis for [name]  -continue statin  -followed by neurology   2.  Atrial Fib - with significant bradycardia/SSS, s/p placement of dual chamber pacemaker   -last pacer check with normal function but short runs of NSV   -CHADS2VASC=5 for age, vascular disease, CVA, continue eliquis 5mg  BID   -he will follow up with me again in 1 year   3. Hx of thrombocytopenia - etiology unknown, workup per hematology    4. Body mass index is 23.25 kg/m??. weight is stable   5. Dizziness with position changes - advised him to begin drinking 5-6 glasses of water daily      Fam hx: no early CAD, Afib, CHF, pacemakers  Soc hx: no tobacco use, one alcoholic beverage nightly     5/19 Lexi nuc A fib occRV pacing Frequent PVCsNo ischemia/ infarctLVEF 53%  12/22/17: placement of St Jude dual chamber pacer  TTE 12/21/17 - LVEF 65 % to 70 %, no WMA, RV moderately dilated, dilated RA, mild MR, mild to mod TR     He  has a past medical history of Arrhythmia, Arthritis, Atrial fibrillation ([name]), Chronic pain, Dementia ([name]), Hypercholesterolemia, Insomnia, Long term current use of anticoagulant therapy, Pacemaker, and Stroke ([name]). He also has no past medical history of Hypertension.    Cardiovascular ROS: negative for - chest pain or dyspnea on exertion  Respiratory ROS: no cough, shortness of breath, or wheezing  Neurological ROS: no TIA or stroke symptoms  All other systems negative except as above.     PE  Vitals:    11/23/19 1002   BP: 124/84   Pulse: 80   Resp: 14   SpO2: 97%   Weight: 176 lb (79.8 kg)   Height: 6' (1.829 m)    Body mass index is 23.87 kg/m??.  General appearance - alert, well appearing, and in no distress  Mental status - affect appropriate to mood  Eyes - sclera anicteric, moist mucous membranes  Neck - supple, no carotid bruits   Lymphatics - no lymphadenopathy  Chest - clear to auscultation bilaterally  Heart - normal rate, irregular rhythm, normal S1, S2, no murmurs, rubs, clicks or gallops  Abdomen - soft, nontender, nondistended  Back exam - full range of motion, no tenderness  Neurological - cranial nerves II through XII grossly intact, no focal deficit  Musculoskeletal - no muscular tenderness noted, normal strength  Extremities - peripheral pulses 1+ BLE, no pedal edema  Skin - normal coloration  no rashes    Recent Labs:  Lab Results   Component Value Date/Time    Cholesterol, total 115 08/04/2018 08:10 AM    HDL Cholesterol 47 08/04/2018 08:10 AM    LDL, calculated 57 08/04/2018 08:10 AM    Triglyceride 53 08/04/2018 08:10 AM    CHOL/HDL Ratio 3.1 12/20/2017 05:40 AM     Lab Results   Component Value Date/Time    Creatinine 1.14 11/24/2018 10:55 AM     Lab Results   Component Value Date/Time    BUN 15 11/24/2018 10:55 AM     Lab Results   Component Value Date/Time    Potassium 5.1 11/24/2018 10:55 AM     Lab Results   Component Value Date/Time    Hemoglobin A1c 6.1 12/20/2017 05:40 AM      Lab Results   Component Value Date/Time    HGB 13.9 01/26/2018 03:09 PM     Lab Results   Component Value Date/Time    PLATELET 101 (L) 01/26/2018 03:09 PM       Reviewed:  Past Medical History:   Diagnosis Date   ??? Arrhythmia    ??? Arthritis    ??? Atrial fibrillation (HCC)    ??? Chronic pain     RIGHT HIP   ??? Dementia (HCC)    ??? Hypercholesterolemia    ??? Insomnia    ??? Long term current use of anticoagulant therapy    ??? Pacemaker    ??? Stroke [name])      Social History     Tobacco Use   Smoking Status Current Every Day Smoker   ??? Types: Cigars   Smokeless Tobacco Never Used     Social History     Substance and Sexual Activity   Alcohol Use Yes   ??? Frequency: 2-3 times a week    Comment: 2/WEEK VODKA     Allergies   Allergen Reactions   ??? Flu Vaccine Qs2014-15(42mo,Up) Rash     States not an allergy 11/23/19       Current Outpatient Medications   Medication Sig   ??? diphenhydrAMINE (Benadryl Allergy) 25 mg tablet Take 25 mg by mouth as needed.   ??? levothyroxine (SYNTHROID) 50 mcg tablet TAKE 1 TABLET BY MOUTH EVERY DAY BEFORE BREAKFAST   ??? apixaban (Eliquis) 5 mg tablet TAKE 1 TABLET BY MOUTH TWICE A DAY   ??? memantine ER (Namenda XR) 28 mg capsule Take 1 Cap by mouth daily.   ??? atorvastatin (LIPITOR) 20 mg tablet TAKE 1 TABLET BY MOUTH EVERY DAY   ??? acetaminophen (TYLENOL) 325 mg tablet Take 2 Tabs by mouth every six (6) hours as needed.     No current facility-administered medications for this visit.        [name], MD  [name]  [address], [address]  [name], [place] [address]

## 2019-11-23 NOTE — Addendum Note (Signed)
Addended byNorval Morton, Duane Earnshaw on: 03/02/2020 08:37 AM     Modules accepted: Orders

## 2019-12-28 NOTE — Telephone Encounter (Signed)
Requested Prescriptions     Pending Prescriptions Disp Refills   ??? memantine ER (Namenda XR) 28 mg capsule 90 Cap 1     Sig: Take 1 Cap by mouth daily.

## 2020-01-11 MED ORDER — MEMANTINE 28 MG SPRINKLE ER 24HR CAPSULE
28 mg | ORAL_CAPSULE | Freq: Every day | ORAL | 0 refills | Status: DC
Start: 2020-01-11 — End: 2020-04-03

## 2020-01-12 ENCOUNTER — Encounter

## 2020-01-13 MED ORDER — LEVOTHYROXINE 50 MCG TAB
50 mcg | ORAL_TABLET | ORAL | 0 refills | Status: DC
Start: 2020-01-13 — End: 2020-05-01

## 2020-01-13 NOTE — Telephone Encounter (Signed)
Needs office visit,last seen over a year ago

## 2020-02-15 ENCOUNTER — Ambulatory Visit: Primary: Family

## 2020-02-15 ENCOUNTER — Ambulatory Visit: Admit: 2020-02-15 | Discharge: 2020-02-15 | Payer: MEDICARE | Primary: Family

## 2020-02-15 DIAGNOSIS — Z950 Presence of cardiac pacemaker: Secondary | ICD-10-CM

## 2020-02-15 NOTE — Progress Notes (Signed)
See scanned Pacemaker Report in Chart Review.  Chargeable visit.

## 2020-02-17 ENCOUNTER — Encounter: Attending: Neurology | Primary: Family

## 2020-02-29 ENCOUNTER — Encounter: Attending: Neurology | Primary: Family

## 2020-03-02 ENCOUNTER — Encounter

## 2020-03-02 LAB — PROBNP, N-TERMINAL: BNP: 1224 PG/ML — ABNORMAL HIGH (ref <450)

## 2020-03-02 LAB — LIPID PANEL
CHOL/HDL Ratio: 2.3 (ref 0.0–5.0)
Chol/HDL Ratio: 2.3 (ref 0.0–5.0)
Cholesterol, Total: 129 MG/DL (ref <200)
Cholesterol, total: 129 MG/DL (ref <200)
HDL Cholesterol: 56 MG/DL
HDL: 56 MG/DL
LDL Calculated: 62 MG/DL (ref 0–100)
LDL, calculated: 62 MG/DL (ref 0–100)
Triglyceride: 55 MG/DL (ref <150)
Triglycerides: 55 MG/DL (ref <150)
VLDL Cholesterol Calculated: 11 MG/DL
VLDL, calculated: 11 MG/DL

## 2020-03-02 LAB — COMPREHENSIVE METABOLIC PANEL
ALT: 20 U/L (ref 12–78)
AST: 13 U/L — ABNORMAL LOW (ref 15–37)
Albumin/Globulin Ratio: 1.4 (ref 1.1–2.2)
Albumin: 3.9 g/dL (ref 3.5–5.0)
Alkaline Phosphatase: 72 U/L (ref 45–117)
Anion Gap: 3 mmol/L — ABNORMAL LOW (ref 5–15)
BUN: 17 MG/DL (ref 6–20)
Bun/Cre Ratio: 16 (ref 12–20)
CO2: 31 mmol/L (ref 21–32)
Calcium: 9.3 MG/DL (ref 8.5–10.1)
Chloride: 111 mmol/L — ABNORMAL HIGH (ref 97–108)
Creatinine: 1.08 MG/DL (ref 0.70–1.30)
EGFR IF NonAfrican American: >60 mL/min/{1.73_m2} (ref >60)
GFR African American: >60 mL/min/{1.73_m2} (ref >60)
Globulin: 2.7 g/dL (ref 2.0–4.0)
Glucose: 89 mg/dL (ref 65–100)
Potassium: 4.6 mmol/L (ref 3.5–5.1)
Sodium: 145 mmol/L (ref 136–145)
Total Bilirubin: 0.9 MG/DL (ref 0.2–1.0)
Total Protein: 6.6 g/dL (ref 6.4–8.2)

## 2020-03-02 LAB — CBC
Hematocrit: 44.7 % (ref 36.6–50.3)
Hemoglobin: 14.7 g/dL (ref 12.1–17.0)
MCH: 32.5 PG (ref 26.0–34.0)
MCHC: 32.9 g/dL (ref 30.0–36.5)
MCV: 98.9 FL (ref 80.0–99.0)
MPV: 11.2 FL (ref 8.9–12.9)
NRBC Absolute: 0 10*3/uL (ref 0.00–0.01)
Nucleated RBCs: 0 PER 100 WBC
Platelets: 93 10*3/uL — ABNORMAL LOW (ref 150–400)
RBC: 4.52 M/uL (ref 4.10–5.70)
RDW: 13.4 % (ref 11.5–14.5)
WBC: 5.7 10*3/uL (ref 4.1–11.1)

## 2020-03-02 LAB — MAGNESIUM
Magnesium: 2.1 mg/dL (ref 1.6–2.4)
Magnesium: 2.1 mg/dL (ref 1.6–2.4)

## 2020-03-02 LAB — T4, FREE
T4 Free: 0.9 NG/DL (ref 0.8–1.5)
T4, Free: 0.9 NG/DL (ref 0.8–1.5)

## 2020-03-02 LAB — TSH 3RD GENERATION
TSH: 3.91 u[IU]/mL — ABNORMAL HIGH (ref 0.36–3.74)
TSH: 3.91 u[IU]/mL — ABNORMAL HIGH (ref 0.36–3.74)

## 2020-03-02 LAB — CBC W/O DIFF
ABSOLUTE NRBC: 0 10*3/uL (ref 0.00–0.01)
HCT: 44.7 % (ref 36.6–50.3)
HGB: 14.7 g/dL (ref 12.1–17.0)
MCH: 32.5 PG (ref 26.0–34.0)
MCHC: 32.9 g/dL (ref 30.0–36.5)
MCV: 98.9 FL (ref 80.0–99.0)
MPV: 11.2 FL (ref 8.9–12.9)
NRBC: 0 PER 100 WBC
PLATELET: 93 10*3/uL — ABNORMAL LOW (ref 150–400)
RBC: 4.52 M/uL (ref 4.10–5.70)
RDW: 13.4 % (ref 11.5–14.5)
WBC: 5.7 10*3/uL (ref 4.1–11.1)

## 2020-03-02 LAB — METABOLIC PANEL, COMPREHENSIVE
A-G Ratio: 1.4 (ref 1.1–2.2)
ALT (SGPT): 20 U/L (ref 12–78)
AST (SGOT): 13 U/L — ABNORMAL LOW (ref 15–37)
Albumin: 3.9 g/dL (ref 3.5–5.0)
Alk. phosphatase: 72 U/L (ref 45–117)
Anion gap: 3 mmol/L — ABNORMAL LOW (ref 5–15)
BUN/Creatinine ratio: 16 (ref 12–20)
BUN: 17 MG/DL (ref 6–20)
Bilirubin, total: 0.9 MG/DL (ref 0.2–1.0)
CO2: 31 mmol/L (ref 21–32)
Calcium: 9.3 MG/DL (ref 8.5–10.1)
Chloride: 111 mmol/L — ABNORMAL HIGH (ref 97–108)
Creatinine: 1.08 MG/DL (ref 0.70–1.30)
GFR est AA: >60 mL/min/{1.73_m2} (ref >60)
GFR est non-AA: >60 mL/min/{1.73_m2} (ref >60)
Globulin: 2.7 g/dL (ref 2.0–4.0)
Glucose: 89 mg/dL (ref 65–100)
Potassium: 4.6 mmol/L (ref 3.5–5.1)
Protein, total: 6.6 g/dL (ref 6.4–8.2)
Sodium: 145 mmol/L (ref 136–145)

## 2020-03-02 LAB — SAMPLES BEING HELD: SAMPLES BEING HELD: 1

## 2020-03-02 LAB — NT-PRO BNP: NT pro-BNP: 1224 PG/ML — ABNORMAL HIGH (ref <450)

## 2020-03-28 NOTE — Telephone Encounter (Signed)
Pt's sister in law, Richar Dunklee, calling stating she is bringing the patient to his appt on 4/20 and asked if both her and pt's caregiver from  [facility] could be in the room for the patients appt. I did tell her we are only allowing one person in with the patient and she requested to speak with the nurse. Please call

## 2020-03-29 NOTE — Telephone Encounter (Signed)
Spoke with [name], informed her only one person can go back with the patient. She will get a list of questions and a list of his medications.

## 2020-04-03 ENCOUNTER — Ambulatory Visit: Attending: Neurology | Primary: Family

## 2020-04-03 ENCOUNTER — Ambulatory Visit: Admit: 2020-04-03 | Payer: MEDICARE | Attending: Neurology | Primary: Family

## 2020-04-03 DIAGNOSIS — G301 Alzheimer's disease with late onset: Secondary | ICD-10-CM

## 2020-04-03 MED ORDER — MEMANTINE 28 MG SPRINKLE ER 24HR CAPSULE
28 mg | ORAL_CAPSULE | Freq: Every day | ORAL | 1 refills | Status: DC
Start: 2020-04-03 — End: 2020-09-28

## 2020-04-03 MED ORDER — DONEPEZIL 10 MG TAB
10 mg | ORAL_TABLET | ORAL | 1 refills | Status: DC
Start: 2020-04-03 — End: 2020-09-28

## 2020-04-03 NOTE — Progress Notes (Signed)
Neurology Clinic Follow up Note    Patient ID:  [name]  [id number]  86 y.o.  Feb 27, 1932      [name] is here for follow up today of stroke       Last Appointment With Me:  05/18/2019    Interval History:   Patient is here for f/u of stroke.  Here for f/u of L MCA small territory infarct, L M2/3 occlusion not amenable to endovascular intervention. ??Mechanism likely cardioembolic in setting of Afib. S/P PPM last hospitalization.    No new deficits/focal weakness/numbness/vision loss.  He remains on Eliquis for Afib/stroke prevention.  No reported side effects/hemorrhage.    He reports memory is stable/unchanged from last visit.  His caregiver reports he is having some increasing difficulty pertaining to the following:  -managing financial accounts  -difficulty using his phone  -loses credit cards  -repeating himself often  -needs assistance with medication administration    He appears to be tolerating Namenda well without side effects.   He has caregivers MWF in the mornings only.    He is currently driving, limited to short distances, not on highways following OT driving assessment.    No reported behavioral concerns.     PMHx/ PSHx/ FHx/ SHx:  Reviewed and unchanged previous visit.   Past Medical History:   Diagnosis Date   ??? Arrhythmia    ??? Arthritis    ??? Atrial fibrillation ([place])    ??? Chronic pain     RIGHT HIP   ??? Dementia ([place])    ??? Hypercholesterolemia    ??? Insomnia    ??? Long term current use of anticoagulant therapy    ??? Pacemaker    ??? Stroke ([place])          ROS:  Comprehensive review of systems negative except for as noted above.       Objective:       Meds:  Current Outpatient Medications   Medication Sig Dispense Refill   ??? levothyroxine (SYNTHROID) 50 mcg tablet TAKE 1 TABLET BY MOUTH EVERY DAY BEFORE BREAKFAST 90 Tab 0   ??? memantine ER (Namenda XR) 28 mg capsule Take 1 Cap by mouth daily. 90 Cap 0   ??? diphenhydrAMINE (Benadryl Allergy) 25 mg tablet Take 25 mg by mouth as needed.     ??? fludrocortisone  (FLORINEF) 0.1 mg tablet Take 0.5 Tabs by mouth daily. 90 Tab 3   ??? apixaban (Eliquis) 5 mg tablet TAKE 1 TABLET BY MOUTH TWICE A DAY 180 Tab 1   ??? atorvastatin (LIPITOR) 20 mg tablet TAKE 1 TABLET BY MOUTH EVERY DAY 90 Tab 1   ??? acetaminophen (TYLENOL) 325 mg tablet Take 2 Tabs by mouth every six (6) hours as needed.         Exam:  Visit Vitals  BP 114/64   Pulse 72   Resp 18   SpO2 98%      NEUROLOGICAL EXAM:  General: Awake, alert, speech fluent. MOCA 17/30 (see scanned media)   CN: PERRL, EOMI without nystagmus, VFF to confrontation, facial sensation and strength are normal and symmetric, hearing is intact to finger rub bilaterally, palate and tongue movements are intact and symmetric.  Motor: Normal tone, bulk and strength bilaterally.  Reflexes: Deferred  Coordination: FNF, RAM, HTS intact.  Sensation: LT intact throughout.  Gait: Slightly unsteady    LABS  Results for orders placed or performed in visit on 52/84/13   METABOLIC PANEL, COMPREHENSIVE   Result Value  Ref Range    Sodium 145 136 - 145 mmol/L    Potassium 4.6 3.5 - 5.1 mmol/L    Chloride 111 (H) 97 - 108 mmol/L    CO2 31 21 - 32 mmol/L    Anion gap 3 (L) 5 - 15 mmol/L    Glucose 89 65 - 100 mg/dL    BUN 17 6 - 20 MG/DL    Creatinine 1.08 0.70 - 1.30 MG/DL    BUN/Creatinine ratio 16 12 - 20      GFR est AA >60 >60 ml/min/1.37m    GFR est non-AA >60 >60 ml/min/1.755m   Calcium 9.3 8.5 - 10.1 MG/DL    Bilirubin, total 0.9 0.2 - 1.0 MG/DL    ALT (SGPT) 20 12 - 78 U/L    AST (SGOT) 13 (L) 15 - 37 U/L    Alk. phosphatase 72 45 - 117 U/L    Protein, total 6.6 6.4 - 8.2 g/dL    Albumin 3.9 3.5 - 5.0 g/dL    Globulin 2.7 2.0 - 4.0 g/dL    A-G Ratio 1.4 1.1 - 2.2     MAGNESIUM   Result Value Ref Range    Magnesium 2.1 1.6 - 2.4 mg/dL   LIPID PANEL   Result Value Ref Range    LIPID PROFILE          Cholesterol, total 129 <200 MG/DL    Triglyceride 55 <150 MG/DL    HDL Cholesterol 56 MG/DL    LDL, calculated 62 0 - 100 MG/DL    VLDL, calculated 11 MG/DL     CHOL/HDL Ratio 2.3 0.0 - 5.0     NT-PRO BNP   Result Value Ref Range    NT pro-BNP 1,224 (H) <450 PG/ML   CBC W/O DIFF   Result Value Ref Range    WBC 5.7 4.1 - 11.1 K/uL    RBC 4.52 4.10 - 5.70 M/uL    HGB 14.7 12.1 - 17.0 g/dL    HCT 44.7 36.6 - 50.3 %    MCV 98.9 80.0 - 99.0 FL    MCH 32.5 26.0 - 34.0 PG    MCHC 32.9 30.0 - 36.5 g/dL    RDW 13.4 11.5 - 14.5 %    PLATELET 93 (L) 150 - 400 K/uL    MPV 11.2 8.9 - 12.9 FL    NRBC 0.0 0 PER 100 WBC    ABSOLUTE NRBC 0.00 0.00 - 0.01 K/uL   TSH 3RD GENERATION   Result Value Ref Range    TSH 3.91 (H) 0.36 - 3.74 uIU/mL   T4, FREE   Result Value Ref Range    T4, Free 0.9 0.8 - 1.5 NG/DL   SAMPLES BEING HELD   Result Value Ref Range    SAMPLES BEING HELD 1 SST     COMMENT        Add-on orders for these samples will be processed based on acceptable specimen integrity and analyte stability, which may vary by analyte.       IMAGING:  MRI Results (most recent):  Results from Hospital Encounter encounter on 12/19/17   MRI BRAIN WO CONT    Narrative *PRELIMINARY REPORT*    Punctate foci of diffusion restriction in the left frontal lobe are compatible  with acute small vessel infarcts. No evidence of intracranial hemorrhage or  mass. Mild periventricular and subcortical white matter disease, compatible with  chronic small vessel ischemia.    Preliminary report was provided  by Dr. Sharol Roussel, the on-call radiologist, at 1:30  PM.    Final report to follow.    *END PRELIMINARY REPORT*    EXAM:  MRI BRAIN WO CONT  History: Dysarthria  INDICATION:  Dysarthria    COMPARISON: CTA 12/19/2017.    CONTRAST:  None.    TECHNIQUE: Sagittal T1, axial FLAIR, T2,T1 and gradient echo images as well as  coronal T2 weighted images and axial diffusion weighted images of the head were  obtained.    FINDINGS:      Small less than 5 mm foci of cortically based infarction in the left frontal  lobe. Anterior division MCA vascular territory. No other foci of infarction. No  Chiari or syrinx. Pituitary  infundibulum unremarkable. Scattered foci of  increased T2 signal intensity corona radiata and centrum semiovale. Small remote  lacunar infarctions in the cerebral white matter on the right and on the left.  Mucoperiosteal thickening in the left maxillary sinus. Left frontal sinus  disease. Sphenoid sinus disease as well. Trace fluid in the vascular cells.  There is no evidence of mass, hemorrhage or abnormal extra-axial fluid  collection.  Normal appearing flow-voids are present in the vertebral, basilar  and carotid artery systems. The craniocervical junction is normal.     Impression IMPRESSION:     Small acute cortical infarction left MCA M2 anterior division segment.  Mild paranasal sinus disease. Greater on the left.  Mild chronic microvascular ischemic change with additional scattered remote  lacunar infarctions in the cerebral white matter. Mild cerebral atrophy.  Correlates with CTA findings of occluded small left MCA territory segmental  branch.                 Assessment:     Encounter Diagnoses     ICD-10-CM ICD-9-CM   1. Late onset Alzheimer's dementia without behavioral disturbance (HCC)  G30.1 331.0    F02.80 294.10   2. Cerebrovascular accident (CVA), unspecified mechanism ([place])  I63.9 434.91   3. Paroxysmal atrial fibrillation (HCC)  I48.0 64.23      86 year old RHM here for f/u of L MCA embolic appearing small territory infarct, L M2/3 occlusion not amenable to endovascular intervention in the setting of atrial fibrillation diagnosed on prior admission.  He is doing well post discharge from a stroke perspective without residual focal deficits.  Remains on NOAC/Eliquis and statin therapy.      Regarding his memory, caregivers have noted a decline over the past year in terms of executive functioning and recall. He is having more difficulty maintaining his finances. MOCA is 17/30 today (23/30 previously) with more prominent executive dysfunction, disorientation. We discussed need for assistance with  finances and executive decisions moving forward due to evidence of moderate dementia on examination. Suspect AD subtype. Will start Aricept to assist with memory deficits and continue Namenda as prescribed, which he appears to be tolerating well. We discussed that he may benefit from more frequent visits to assist with medication administration vs pre-packaged medication from the pharmacy. Will repeat OT driving assessment due to progressive cognitive decline to ensure safety with continued driving.   Plan:   Start Aricept 1m qhs x 1 week, then increase to 157mqhs  Continue Namenda XR [id number]  Cont. Eliquis for stroke prevention  Cont. Statin therapy  Goal SBP<140, LDL<70   OT driving evaluation   Recommend caregivers daily to assist with medication administration  Recommend assistance with finances/executive decisions     Follow-up and Dispositions    ??  Return in about 6 months (around 10/03/2020).         Signed:  Orson Ape, DO  04/03/2020

## 2020-04-03 NOTE — Progress Notes (Signed)
OT driving evaluation faxed to [facility]. Patient/family provided contact information at end of visit today.

## 2020-04-03 NOTE — Progress Notes (Signed)
Neurology Clinic Follow up Note    Patient ID:  [name]  [id number]  86 y.o.  [birth year]      Mr. Dennington is here for follow up today of stroke       Last Appointment With Me:  05/18/2019    Interval History:   Patient is here for f/u of stroke.  Here for f/u of L MCA small territory infarct, L M2/3 occlusion not amenable to endovascular intervention. ??Mechanism likely cardioembolic in setting of Afib. S/P PPM last hospitalization.    No new deficits/focal weakness/numbness/vision loss.  He remains on Eliquis for Afib/stroke prevention.  No reported side effects/hemorrhage.    He reports memory is stable/unchanged from last visit.  His caregiver reports he is having some increasing difficulty pertaining to the following:  -managing financial accounts  -difficulty using his phone  -loses credit cards  -repeating himself often  -needs assistance with medication administration    He appears to be tolerating Namenda well without side effects.   He has caregivers MWF in the mornings only.    He is currently driving, limited to short distances, not on highways following OT driving assessment.    No reported behavioral concerns.     PMHx/ PSHx/ FHx/ SHx:  Reviewed and unchanged previous visit.   Past Medical History:   Diagnosis Date   ??? Arrhythmia    ??? Arthritis    ??? Atrial fibrillation ([place])    ??? Chronic pain     RIGHT HIP   ??? Dementia ([place])    ??? Hypercholesterolemia    ??? Insomnia    ??? Long term current use of anticoagulant therapy    ??? Pacemaker    ??? Stroke ([place])          ROS:  Comprehensive review of systems negative except for as noted above.       Objective:       Meds:  Current Outpatient Medications   Medication Sig Dispense Refill   ??? levothyroxine (SYNTHROID) 50 mcg tablet TAKE 1 TABLET BY MOUTH EVERY DAY BEFORE BREAKFAST 90 Tab 0   ??? memantine ER (Namenda XR) 28 mg capsule Take 1 Cap by mouth daily. 90 Cap 0   ??? diphenhydrAMINE (Benadryl Allergy) 25 mg tablet Take 25 mg by mouth as needed.     ??? fludrocortisone  (FLORINEF) 0.1 mg tablet Take 0.5 Tabs by mouth daily. 90 Tab 3   ??? apixaban (Eliquis) 5 mg tablet TAKE 1 TABLET BY MOUTH TWICE A DAY 180 Tab 1   ??? atorvastatin (LIPITOR) 20 mg tablet TAKE 1 TABLET BY MOUTH EVERY DAY 90 Tab 1   ??? acetaminophen (TYLENOL) 325 mg tablet Take 2 Tabs by mouth every six (6) hours as needed.         Exam:  Visit Vitals  BP 114/64   Pulse 72   Resp 18   SpO2 98%      NEUROLOGICAL EXAM:  General: Awake, alert, speech fluent. MOCA 17/30 (see scanned media)   CN: PERRL, EOMI without nystagmus, VFF to confrontation, facial sensation and strength are normal and symmetric, hearing is intact to finger rub bilaterally, palate and tongue movements are intact and symmetric.  Motor: Normal tone, bulk and strength bilaterally.  Reflexes: Deferred  Coordination: FNF, RAM, HTS intact.  Sensation: LT intact throughout.  Gait: Slightly unsteady    LABS  Results for orders placed or performed in visit on 10/10/24   METABOLIC PANEL, COMPREHENSIVE   Result Value  Ref Range    Sodium 145 136 - 145 mmol/L    Potassium 4.6 3.5 - 5.1 mmol/L    Chloride 111 (H) 97 - 108 mmol/L    CO2 31 21 - 32 mmol/L    Anion gap 3 (L) 5 - 15 mmol/L    Glucose 89 65 - 100 mg/dL    BUN 17 6 - 20 MG/DL    Creatinine 1.08 0.70 - 1.30 MG/DL    BUN/Creatinine ratio 16 12 - 20      GFR est AA >60 >60 ml/min/1.40m    GFR est non-AA >60 >60 ml/min/1.747m   Calcium 9.3 8.5 - 10.1 MG/DL    Bilirubin, total 0.9 0.2 - 1.0 MG/DL    ALT (SGPT) 20 12 - 78 U/L    AST (SGOT) 13 (L) 15 - 37 U/L    Alk. phosphatase 72 45 - 117 U/L    Protein, total 6.6 6.4 - 8.2 g/dL    Albumin 3.9 3.5 - 5.0 g/dL    Globulin 2.7 2.0 - 4.0 g/dL    A-G Ratio 1.4 1.1 - 2.2     MAGNESIUM   Result Value Ref Range    Magnesium 2.1 1.6 - 2.4 mg/dL   LIPID PANEL   Result Value Ref Range    LIPID PROFILE          Cholesterol, total 129 <200 MG/DL    Triglyceride 55 <150 MG/DL    HDL Cholesterol 56 MG/DL    LDL, calculated 62 0 - 100 MG/DL    VLDL, calculated 11 MG/DL     CHOL/HDL Ratio 2.3 0.0 - 5.0     NT-PRO BNP   Result Value Ref Range    NT pro-BNP 1,224 (H) <450 PG/ML   CBC W/O DIFF   Result Value Ref Range    WBC 5.7 4.1 - 11.1 K/uL    RBC 4.52 4.10 - 5.70 M/uL    HGB 14.7 12.1 - 17.0 g/dL    HCT 44.7 36.6 - 50.3 %    MCV 98.9 80.0 - 99.0 FL    MCH 32.5 26.0 - 34.0 PG    MCHC 32.9 30.0 - 36.5 g/dL    RDW 13.4 11.5 - 14.5 %    PLATELET 93 (L) 150 - 400 K/uL    MPV 11.2 8.9 - 12.9 FL    NRBC 0.0 0 PER 100 WBC    ABSOLUTE NRBC 0.00 0.00 - 0.01 K/uL   TSH 3RD GENERATION   Result Value Ref Range    TSH 3.91 (H) 0.36 - 3.74 uIU/mL   T4, FREE   Result Value Ref Range    T4, Free 0.9 0.8 - 1.5 NG/DL   SAMPLES BEING HELD   Result Value Ref Range    SAMPLES BEING HELD 1 SST     COMMENT        Add-on orders for these samples will be processed based on acceptable specimen integrity and analyte stability, which may vary by analyte.       IMAGING:  MRI Results (most recent):  Results from Hospital Encounter encounter on 12/19/17   MRI BRAIN WO CONT    Narrative *PRELIMINARY REPORT*    Punctate foci of diffusion restriction in the left frontal lobe are compatible  with acute small vessel infarcts. No evidence of intracranial hemorrhage or  mass. Mild periventricular and subcortical white matter disease, compatible with  chronic small vessel ischemia.    Preliminary report was provided  by Dr. Sharol Roussel, the on-call radiologist, at 1:30  PM.    Final report to follow.    *END PRELIMINARY REPORT*    EXAM:  MRI BRAIN WO CONT  History: Dysarthria  INDICATION:  Dysarthria    COMPARISON: CTA 12/19/2017.    CONTRAST:  None.    TECHNIQUE: Sagittal T1, axial FLAIR, T2,T1 and gradient echo images as well as  coronal T2 weighted images and axial diffusion weighted images of the head were  obtained.    FINDINGS:      Small less than 5 mm foci of cortically based infarction in the left frontal  lobe. Anterior division MCA vascular territory. No other foci of infarction. No  Chiari or syrinx. Pituitary  infundibulum unremarkable. Scattered foci of  increased T2 signal intensity corona radiata and centrum semiovale. Small remote  lacunar infarctions in the cerebral white matter on the right and on the left.  Mucoperiosteal thickening in the left maxillary sinus. Left frontal sinus  disease. Sphenoid sinus disease as well. Trace fluid in the vascular cells.  There is no evidence of mass, hemorrhage or abnormal extra-axial fluid  collection.  Normal appearing flow-voids are present in the vertebral, basilar  and carotid artery systems. The craniocervical junction is normal.     Impression IMPRESSION:     Small acute cortical infarction left MCA M2 anterior division segment.  Mild paranasal sinus disease. Greater on the left.  Mild chronic microvascular ischemic change with additional scattered remote  lacunar infarctions in the cerebral white matter. Mild cerebral atrophy.  Correlates with CTA findings of occluded small left MCA territory segmental  branch.                 Assessment:     Encounter Diagnoses     ICD-10-CM ICD-9-CM   1. Late onset Alzheimer's dementia without behavioral disturbance (HCC)  G30.1 331.0    F02.80 294.10   2. Cerebrovascular accident (CVA), unspecified mechanism ([place])  I63.9 434.91   3. Paroxysmal atrial fibrillation (HCC)  I48.0 62.85      86 year old RHM here for f/u of L MCA embolic appearing small territory infarct, L M2/3 occlusion not amenable to endovascular intervention in the setting of atrial fibrillation diagnosed on prior admission.  He is doing well post discharge from a stroke perspective without residual focal deficits.  Remains on NOAC/Eliquis and statin therapy.      Regarding his memory, caregivers have noted a decline over the past year in terms of executive functioning and recall. He is having more difficulty maintaining his finances. MOCA is 17/30 today (23/30 previously) with more prominent executive dysfunction, disorientation. We discussed need for assistance with  finances and executive decisions moving forward due to evidence of moderate dementia on examination. Suspect AD subtype. Will start Aricept to assist with memory deficits and continue Namenda as prescribed, which he appears to be tolerating well. We discussed that he may benefit from more frequent visits to assist with medication administration vs pre-packaged medication from the pharmacy. Will repeat OT driving assessment due to progressive cognitive decline to ensure safety with continued driving.   Plan:   Start Aricept 19m qhs x 1 week, then increase to 123mqhs  Continue Namenda XR 2867maily  Cont. Eliquis for stroke prevention  Cont. Statin therapy  Goal SBP<140, LDL<70   OT driving evaluation   Recommend caregivers daily to assist with medication administration  Recommend assistance with finances/executive decisions     Follow-up and Dispositions    ??  Return in about 6 months (around 10/03/2020).         Signed:  Orson Ape, DO  04/03/2020

## 2020-04-03 NOTE — Progress Notes (Signed)
OT driving evaluation faxed to [facility]. Patient/family provided contact information at end of visit today.

## 2020-04-03 NOTE — Patient Instructions (Signed)
PRESCRIPTION REFILL POLICY    [facility]   Statement to Patients  March 15, 2013      In an effort to ensure the large volume of patient prescription refills is processed in the most efficient and expeditious manner, we are asking our patients to assist us by calling your Pharmacy for all prescription refills, this will include also your  Mail Order Pharmacy. The pharmacy will contact our office electronically to continue the refill process.    Please do not wait until the last minute to call your pharmacy. We need at least 48 hours (2days) to fill prescriptions. We also encourage you to call your pharmacy before going to pick up your prescription to make sure it is ready.     With regard to controlled substance prescription refill requests (narcotic refills) that need to be picked up at our office, we ask your cooperation by providing us with at least 72 hours (3days) notice that you will need a refill.    We will not refill narcotic prescription refill requests after 4:00pm on any weekday, Monday through Thursday, or after 2:00pm on Fridays, or on the weekends.      We encourage everyone to explore another way of getting your prescription refill request processed using MyChart, our patient web portal through our electronic medical record system. MyChart is an efficient and effective way to communicate your medication request directly to the office and  downloadable as an app on your smart phone . MyChart also features a review functionality that allows you to view your medication list as well as leave messages for your physician. Are you ready to get connected? If so please review the attatched instructions or speak to any of our staff to get you set up right away!    Thank you so much for your cooperation. Should you have any questions please contact our Practice Administrator.    The Physicians and Staff,   Neurology Clinic

## 2020-04-17 NOTE — Progress Notes (Signed)
Received alert for HVR on 04/13/2020 during AF lasting 4 seconds (10 beats) with max V rate 188 bpm.  Not a new finding.  Nuclear stress test in 2019 negative.

## 2020-04-17 NOTE — Progress Notes (Signed)
Received alert for HVR on 04/13/2020 during AF lasting 4 seconds (10 beats) with max V rate 188 bpm.  Not a new finding.  Nuclear stress test in 2019 negative.

## 2020-04-27 ENCOUNTER — Encounter

## 2020-04-27 MED ORDER — ATORVASTATIN 20 MG TAB
20 mg | ORAL_TABLET | ORAL | 0 refills | Status: DC
Start: 2020-04-27 — End: 2020-07-27

## 2020-04-27 NOTE — Telephone Encounter (Signed)
Needs fasting office visit and blood work, last seen jan 2020

## 2020-05-01 ENCOUNTER — Encounter

## 2020-05-01 MED ORDER — LEVOTHYROXINE 50 MCG TAB
50 mcg | ORAL_TABLET | ORAL | 0 refills | Status: DC
Start: 2020-05-01 — End: 2020-05-11

## 2020-05-01 MED ORDER — APIXABAN 5 MG TABLET
5 mg | ORAL_TABLET | ORAL | 0 refills | Status: DC
Start: 2020-05-01 — End: 2020-05-27

## 2020-05-01 NOTE — Telephone Encounter (Signed)
Only two weeks, needs office visit and blood work, last seen jan 2020

## 2020-05-11 ENCOUNTER — Encounter

## 2020-05-11 MED ORDER — LEVOTHYROXINE 50 MCG TAB
50 mcg | ORAL_TABLET | ORAL | 0 refills | Status: DC
Start: 2020-05-11 — End: 2020-06-22

## 2020-05-11 NOTE — Telephone Encounter (Signed)
He must have office visit

## 2020-05-22 ENCOUNTER — Encounter: Primary: Family

## 2020-05-22 ENCOUNTER — Ambulatory Visit: Attending: Clinical Cardiac Electrophysiology | Primary: Family

## 2020-05-22 NOTE — Progress Notes (Signed)
No show

## 2020-05-27 ENCOUNTER — Encounter

## 2020-05-27 MED ORDER — APIXABAN 5 MG TABLET
5 mg | ORAL_TABLET | ORAL | 0 refills | Status: DC
Start: 2020-05-27 — End: 2020-09-14

## 2020-05-28 ENCOUNTER — Encounter

## 2020-05-29 NOTE — Progress Notes (Signed)
Called patient to offer appointment. No answer left VM. Patient is due or office visit with PCP. PSR can schedule appointment for patient when call is returned.

## 2020-06-06 NOTE — Telephone Encounter (Signed)
Pt is requesting refill. Has appt scheduled for 06/13/20

## 2020-06-06 NOTE — Telephone Encounter (Signed)
Spoke with patient, informed him per Dr.Donaldson/[facility] OT- No driving due to recent report. He verbalized understanding.

## 2020-06-06 NOTE — Telephone Encounter (Signed)
 Duplicate request - Eliquis 5 mg #60 was sent to [facility] on 05/27/2020.     E-Prescribing Status: Receipt confirmed by pharmacy (05/27/2020 11:15 AM EDT)    Requested Prescriptions     Pending Prescriptions Disp Refills   . apixaban (Eliquis) 5 mg tablet 60 Tablet 0     Sig: TAKE 1 TABLET BY MOUTH TWICE A DAY

## 2020-06-13 ENCOUNTER — Ambulatory Visit: Attending: Family | Primary: Family

## 2020-06-13 ENCOUNTER — Ambulatory Visit: Admit: 2020-06-13 | Payer: MEDICARE | Attending: Family | Primary: Family

## 2020-06-13 DIAGNOSIS — Z0000 Encounter for general adult medical examination without abnormal findings: Secondary | ICD-10-CM

## 2020-06-13 LAB — COMPREHENSIVE METABOLIC PANEL
ALT: 21 U/L (ref 12–78)
AST: 16 U/L (ref 15–37)
Albumin/Globulin Ratio: 1.4 (ref 1.1–2.2)
Albumin: 4.1 g/dL (ref 3.5–5.0)
Alkaline Phosphatase: 87 U/L (ref 45–117)
Anion Gap: 2 mmol/L — ABNORMAL LOW (ref 5–15)
BUN: 19 MG/DL (ref 6–20)
Bun/Cre Ratio: 15 (ref 12–20)
CO2: 31 mmol/L (ref 21–32)
Calcium: 9.3 MG/DL (ref 8.5–10.1)
Chloride: 111 mmol/L — ABNORMAL HIGH (ref 97–108)
Creatinine: 1.29 MG/DL (ref 0.70–1.30)
EGFR IF NonAfrican American: 53 mL/min/{1.73_m2} — ABNORMAL LOW (ref >60)
GFR African American: >60 mL/min/{1.73_m2} (ref >60)
Globulin: 2.9 g/dL (ref 2.0–4.0)
Glucose: 94 mg/dL (ref 65–100)
Potassium: 4.6 mmol/L (ref 3.5–5.1)
Sodium: 144 mmol/L (ref 136–145)
Total Bilirubin: 1 MG/DL (ref 0.2–1.0)
Total Protein: 7 g/dL (ref 6.4–8.2)

## 2020-06-13 LAB — LIPID PANEL
CHOL/HDL Ratio: 2.5 (ref 0.0–5.0)
Chol/HDL Ratio: 2.5 (ref 0.0–5.0)
Cholesterol, Total: 121 MG/DL (ref <200)
Cholesterol, total: 121 MG/DL (ref <200)
HDL Cholesterol: 49 MG/DL
HDL: 49 MG/DL
LDL Calculated: 62.4 MG/DL (ref 0–100)
LDL, calculated: 62.4 MG/DL (ref 0–100)
Triglyceride: 48 MG/DL (ref <150)
Triglycerides: 48 MG/DL (ref <150)
VLDL Cholesterol Calculated: 9.6 MG/DL
VLDL, calculated: 9.6 MG/DL

## 2020-06-13 LAB — T4, FREE
T4 Free: 1 NG/DL (ref 0.8–1.5)
T4, Free: 1 NG/DL (ref 0.8–1.5)

## 2020-06-13 LAB — TSH 3RD GENERATION
TSH: 3.66 u[IU]/mL (ref 0.36–3.74)
TSH: 3.66 u[IU]/mL (ref 0.36–3.74)

## 2020-06-13 LAB — METABOLIC PANEL, COMPREHENSIVE
A-G Ratio: 1.4 (ref 1.1–2.2)
ALT (SGPT): 21 U/L (ref 12–78)
AST (SGOT): 16 U/L (ref 15–37)
Albumin: 4.1 g/dL (ref 3.5–5.0)
Alk. phosphatase: 87 U/L (ref 45–117)
Anion gap: 2 mmol/L — ABNORMAL LOW (ref 5–15)
BUN/Creatinine ratio: 15 (ref 12–20)
BUN: 19 MG/DL (ref 6–20)
Bilirubin, total: 1 MG/DL (ref 0.2–1.0)
CO2: 31 mmol/L (ref 21–32)
Calcium: 9.3 MG/DL (ref 8.5–10.1)
Chloride: 111 mmol/L — ABNORMAL HIGH (ref 97–108)
Creatinine: 1.29 MG/DL (ref 0.70–1.30)
GFR est AA: >60 mL/min/{1.73_m2} (ref >60)
GFR est non-AA: 53 mL/min/{1.73_m2} — ABNORMAL LOW (ref >60)
Globulin: 2.9 g/dL (ref 2.0–4.0)
Glucose: 94 mg/dL (ref 65–100)
Potassium: 4.6 mmol/L (ref 3.5–5.1)
Protein, total: 7 g/dL (ref 6.4–8.2)
Sodium: 144 mmol/L (ref 136–145)

## 2020-06-13 NOTE — Progress Notes (Signed)
[facility] Note  Subjective:      [name] is a 86 y.o. male who presents for follow up on chronic problems, he has history of hyperlipidemia, hypothyroidism, dementia he is followed by a neurologist-- and pacemaker,  He is followed by cardiologist. Today he is accompanied by his care taker. She goes to his house three days a week. He lives alone.   He is due for medicare physical    Past Medical History:   Diagnosis Date   ??? Arrhythmia    ??? Arthritis    ??? Atrial fibrillation (HCC)    ??? Chronic pain     RIGHT HIP   ??? Dementia (HCC)    ??? Hypercholesterolemia    ??? Insomnia    ??? Long term current use of anticoagulant therapy    ??? Pacemaker    ??? Stroke [facility])      Past Surgical History:   Procedure Laterality Date   ??? HX PACEMAKER     ??? PR ABDOMEN SURGERY PROC UNLISTED         Current Outpatient Medications   Medication Sig Dispense Refill   ??? apixaban (Eliquis) 5 mg tablet TAKE 1 TABLET BY MOUTH TWICE A DAY 60 Tablet 0   ??? levothyroxine (SYNTHROID) 50 mcg tablet TAKE 1 TABLET BY MOUTH EVERY DAY BEFORE BREAKFAST 15 Tablet 0   ??? atorvastatin (LIPITOR) 20 mg tablet TAKE 1 TABLET BY MOUTH EVERY DAY 90 Tab 0   ??? donepeziL (ARICEPT) 10 mg tablet Take 5mg  qhs x 1 week, then increase to 10mg  qhs 90 Tab 1   ??? memantine ER (Namenda XR) 28 mg capsule Take 1 Cap by mouth daily. 90 Cap 1   ??? diphenhydrAMINE (Benadryl Allergy) 25 mg tablet Take 25 mg by mouth as needed.     ??? fludrocortisone (FLORINEF) 0.1 mg tablet Take 0.5 Tabs by mouth daily. 90 Tab 3   ??? acetaminophen (TYLENOL) 325 mg tablet Take 2 Tabs by mouth every six (6) hours as needed.       Allergies   Allergen Reactions   ??? Flu Vaccine Qs2014-15(37mo,Up) Rash     States not an allergy 11/23/19       ROS:   Complete review of systems was reviewed with pertinent information listed in HPI.  Review of Systems   Constitutional: Negative.    HENT: Negative.    Respiratory: Negative.    Cardiovascular: Negative.    Gastrointestinal: Negative.     Genitourinary: Negative.    Musculoskeletal: Negative.    Psychiatric/Behavioral: The patient has insomnia.        Objective:     Visit Vitals  BP 129/79 (BP 1 Location: Right arm, BP Patient Position: Sitting, BP Cuff Size: Adult)   Pulse 87   Temp 98.1 ??F (36.7 ??C) (Temporal)   Resp 16   Ht 6' (1.829 m)   Wt 183 lb 3.2 oz (83.1 kg)   SpO2 98%   BMI 24.85 kg/m??       Vitals and Nurse Documentation reviewed.     Physical Exam  Constitutional:       Appearance: Normal appearance.   HENT:      Mouth/Throat:      Mouth: Mucous membranes are moist.   Cardiovascular:      Rate and Rhythm: Normal rate and regular rhythm.      Pulses: Normal pulses.      Heart sounds: Normal heart sounds. No murmur heard.  Pulmonary:      Effort: Pulmonary effort is normal.      Breath sounds: Normal breath sounds.   Abdominal:      General: Bowel sounds are normal.      Palpations: Abdomen is soft.   Musculoskeletal:      Cervical back: Normal range of motion and neck supple.   Skin:     Comments: Small bruising of skin on his left hand  Has a skin mole on his head, will see dermatologist in one month   Neurological:      Mental Status: He is alert and oriented to person, place, and time.   Psychiatric:         Mood and Affect: Mood normal.         Thought Content: Thought content normal.         Assessment/Plan:     Diagnoses and all orders for this visit:    1. Encounter for initial preventive physical examination covered by Medicare    2. Acquired hypothyroidism  -     T4, FREE; Future  -     TSH 3RD GENERATION; Future    3. Pure hyperglyceridemia  -     LIPID PANEL; Future  -     METABOLIC PANEL, COMPREHENSIVE; Future    4. Chronic diastolic (congestive) heart failure (HCC)    5. At high risk for falls    advised to be carefull walking in stairs       Pt expressed understanding with the diagnosis and plan        Discussed expected course/resolution/complications of diagnosis in detail with patient. ??  Medication  risks/benefits/costs/interactions/alternatives discussed with patient. ??  Pt was given an after visit summary which includes diagnoses, current medications & vitals. ??Pt expressed understanding with the diagnosis and plan  This is the Subsequent Medicare Annual Wellness Exam, performed 12 months or more after the Initial AWV or the last Subsequent AWV    I have reviewed the patient's medical history in detail and updated the computerized patient record.       Assessment/Plan   Education and counseling provided:  Are appropriate based on today's review and evaluation    1. Encounter for initial preventive physical examination covered by Medicare  2. Acquired hypothyroidism  -     T4, FREE; Future  -     TSH 3RD GENERATION; Future  3. Pure hyperglyceridemia  -     LIPID PANEL; Future  -     METABOLIC PANEL, COMPREHENSIVE; Future  4. Chronic diastolic (congestive) heart failure (HCC)  5. At high risk for falls       Depression Risk Factor Screening     3 most recent PHQ Screens 06/13/2020   Little interest or pleasure in doing things Not at all   Feeling down, depressed, irritable, or hopeless Not at all   Total Score PHQ 2 0       Alcohol Risk Screen    Do you average more than 1 drink per night or more than 7 drinks a week: No    In the past three months have you have had more than 4 drinks containing alcohol on one occasion: Yes        Functional Ability and Level of Safety    Hearing: Hearing is good.      Activities of Daily Living:  The home contains: handrails, grab bars, rugs and has steep steps per care taker  Patient needs help with:  transportation, shopping and housework      Ambulation: with mild difficulty     Fall Risk:  Fall Risk Assessment, last 12 mths 06/13/2020   Able to walk? Yes   Fall in past 12 months? 1   Do you feel unsteady? 1   Are you worried about falling 0   Is TUG test greater than 12 seconds? 0   Is the gait abnormal? 0   Number of falls in past 12 months 2   Fall with injury? 0       Abuse Screen:  Patient is not abused       Cognitive Screening    Has your family/caregiver stated any concerns about your memory: yes - dementia     Cognitive Screening: Abnormal - based on obeservation and interview    Health Maintenance Due     Health Maintenance Due   Topic Date Due   ??? COVID-19 Vaccine (1) Never done   ??? Shingrix Vaccine Age 32> (1 of 2) Never done       Patient Care Team   Patient Care Team:  Laurence SpatesSanderford, Lechelle Wrigley, NP as PCP - General (Family Medicine)  Laurence SpatesSanderford, Anjalee Cope, NP as PCP - [facility] Empaneled Provider  Thurston PoundsNgo, Matthew, MD (Cardiology)  Marcellus ScottBrowning, Christine M, MD as Physician (Cardiology)    History     Patient Active Problem List   Diagnosis Code   ??? Acute CVA (cerebrovascular accident) (HCC) I63.9   ??? Tachycardia-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   ??? Paroxysmal atrial fibrillation (HCC) I48.0   ??? Pacemaker Z95.0   ??? At high risk for falls Z91.81     Past Medical History:   Diagnosis Date   ??? Arrhythmia    ??? Arthritis    ??? Atrial fibrillation (HCC)    ??? Chronic pain     RIGHT HIP   ??? Dementia (HCC)    ??? Hypercholesterolemia    ??? Insomnia    ??? Long term current use of anticoagulant therapy    ??? Pacemaker    ??? Stroke [facility](HCC)       Past Surgical History:   Procedure Laterality Date   ??? HX PACEMAKER     ??? PR ABDOMEN SURGERY PROC UNLISTED       Current Outpatient Medications   Medication Sig Dispense Refill   ??? apixaban (Eliquis) 5 mg tablet TAKE 1 TABLET BY MOUTH TWICE A DAY 60 Tablet 0   ??? levothyroxine (SYNTHROID) 50 mcg tablet TAKE 1 TABLET BY MOUTH EVERY DAY BEFORE BREAKFAST 15 Tablet 0   ??? atorvastatin (LIPITOR) 20 mg tablet TAKE 1 TABLET BY MOUTH EVERY DAY 90 Tab 0   ??? donepeziL (ARICEPT) 10 mg tablet Take 5mg  qhs x 1 week, then increase to 10mg  qhs 90 Tab 1   ??? memantine ER (Namenda XR) 28 mg capsule Take 1 Cap by mouth daily. 90 Cap 1   ??? diphenhydrAMINE (Benadryl Allergy) 25 mg tablet Take 25 mg by mouth as needed.     ??? fludrocortisone (FLORINEF) 0.1 mg tablet Take 0.5 Tabs by mouth daily.  90 Tab 3   ??? acetaminophen (TYLENOL) 325 mg tablet Take 2 Tabs by mouth every six (6) hours as needed.       Allergies   Allergen Reactions   ??? Flu Vaccine Qs2014-15(1358mo,Up) Rash     States not an allergy 11/23/19       Family History   Problem Relation Age of Onset   ??? Cancer Mother  BREAST CA   ??? Heart Disease Father    ??? Heart Disease Brother      Social History     Tobacco Use   ??? Smoking status: Current Every Day Smoker     Types: Cigars   ??? Smokeless tobacco: Never Used   Substance Use Topics   ??? Alcohol use: Yes     Comment: 2/WEEK VODKA         Laurence Spates, NP

## 2020-06-13 NOTE — Progress Notes (Signed)
Chief Complaint   Patient presents with   . Other     spots on right side of head, back of right ear and left hand   . Annual Wellness Visit       1. Have you been to the ER, urgent care clinic since your last visit?  Hospitalized since your last visit?No    2. Have you seen or consulted any other health care providers outside of the [facility] since your last visit?  Include any pap smears or colon screening. No    3 most recent PHQ Screens 06/13/2020   Little interest or pleasure in doing things Not at all   Feeling down, depressed, irritable, or hopeless Not at all   Total Score PHQ 2 0     Abuse Screening Questionnaire 06/13/2020   Do you ever feel afraid of your partner? N   Are you in a relationship with someone who physically or mentally threatens you? N   Is it safe for you to go home? Y     ADL Assessment 06/13/2020   Feeding yourself No Help Needed   Getting from bed to chair No Help Needed   Getting dressed No Help Needed   Bathing or showering No Help Needed   Walk across the room (includes cane/walker) No Help Needed   Using the telphone No Help Needed   Taking your medications Help Needed   Preparing meals Help Needed   Managing money (expenses/bills) No Help Needed   Moderately strenuous housework (laundry) No Help Needed   Shopping for personal items (toiletries/medicines) No Help Needed   Shopping for groceries No Help Needed   Driving Help Needed   Climbing a flight of stairs No Help Needed   Getting to places beyond walking distances No Help Needed

## 2020-06-18 ENCOUNTER — Encounter: Payer: MEDICARE | Primary: Family

## 2020-06-20 ENCOUNTER — Encounter: Payer: MEDICARE | Primary: Family

## 2020-06-22 ENCOUNTER — Encounter

## 2020-06-22 MED ORDER — LEVOTHYROXINE 50 MCG TAB
50 mcg | ORAL_TABLET | ORAL | 0 refills | Status: DC
Start: 2020-06-22 — End: 2020-07-20

## 2020-06-27 ENCOUNTER — Encounter: Primary: Family

## 2020-07-06 ENCOUNTER — Ambulatory Visit: Primary: Family

## 2020-07-06 ENCOUNTER — Ambulatory Visit: Admit: 2020-07-06 | Discharge: 2020-07-06 | Payer: MEDICARE | Primary: Family

## 2020-07-06 DIAGNOSIS — Z950 Presence of cardiac pacemaker: Secondary | ICD-10-CM

## 2020-07-06 NOTE — Progress Notes (Signed)
See scanned Pacemaker Report in Chart Review.  Chargeable visit.

## 2020-07-09 NOTE — Telephone Encounter (Signed)
-----   Message from Floydene Flock sent at 07/09/2020  1:20 PM EDT -----  Regarding: Dr. Clide Dales  General Message/Vendor Calls    Caller's first and last name:   Alyson Locket, Sister-in-Law; Legal Caregiver      Reason for call:  What are the next steps, pt failed his 2nd Driving Evaluation Test today, 07/09/20 with [facility]      Callback required yes/no and why:  Yes, advise what will be the next step      Best contact number(s):  [phone]      Details to clarify the request:  Mrs. Scalia stated, pt was last seen on 04/03/20 at 2:30PM due to dementia      Juanita E Wells

## 2020-07-10 NOTE — Telephone Encounter (Signed)
Spoke with [name], informed her Dr.Donaldson wants patient to abstain from driving. She would like a form submitted to the [facility] and to put the address [address] [name] [phone] on the form so the letter from [facility] gets sent to her.

## 2020-07-11 NOTE — Telephone Encounter (Signed)
Spoke with [name], informed her that Dr.Donaldson was okay doing the [facility] medical review form to suspend patient's license but I need patient's license plate number and license number on license. She will get that information and call back tomorrow.

## 2020-07-11 NOTE — Telephone Encounter (Signed)
Pts wife calling stating she has information for jesse regarding her husband. Please call back

## 2020-07-20 ENCOUNTER — Encounter

## 2020-07-20 MED ORDER — LEVOTHYROXINE 50 MCG TAB
50 mcg | ORAL_TABLET | ORAL | 0 refills | Status: DC
Start: 2020-07-20 — End: 2020-07-27

## 2020-07-25 NOTE — Telephone Encounter (Signed)
[name], [name] [facility]) [phone]     Pt wife called and stated that refill of thyroid medication only had 15 pills. Please call back and verify how many pills are supposed to be in the medication refill.

## 2020-07-26 ENCOUNTER — Encounter

## 2020-07-27 ENCOUNTER — Encounter

## 2020-07-27 MED ORDER — ATORVASTATIN 20 MG TAB
20 mg | ORAL_TABLET | ORAL | 0 refills | Status: DC
Start: 2020-07-27 — End: 2021-01-15

## 2020-07-27 MED ORDER — LEVOTHYROXINE 50 MCG TAB
50 mcg | ORAL_TABLET | ORAL | 0 refills | Status: DC
Start: 2020-07-27 — End: 2020-10-21

## 2020-07-27 NOTE — Telephone Encounter (Signed)
Medication re sent, LVM for patient's wife.

## 2020-08-07 ENCOUNTER — Encounter

## 2020-08-07 ENCOUNTER — Ambulatory Visit: Attending: Clinical Cardiac Electrophysiology | Primary: Family

## 2020-08-07 ENCOUNTER — Ambulatory Visit
Admit: 2020-08-07 | Discharge: 2020-08-07 | Payer: MEDICARE | Attending: Clinical Cardiac Electrophysiology | Primary: Family

## 2020-08-07 ENCOUNTER — Institutional Professional Consult (permissible substitution): Admit: 2020-08-07 | Payer: MEDICARE | Primary: Family

## 2020-08-07 DIAGNOSIS — Z950 Presence of cardiac pacemaker: Secondary | ICD-10-CM

## 2020-08-07 NOTE — Progress Notes (Signed)
Cardiac Electrophysiology OFFICE Note   ??   Subjective:   ??   [name] is an 86 y.o.patient who presents for follow up of St. Jude dual chamber pacemaker (DOI 12/22/2017). ??     In 03/2020, had alert for HVR x 4 seconds (10 beats) that occurred during AF, not a new finding.     He denies chest pain, palpitations, PND, orthopnea, syncope, or edema. ??Occasional dizziness with walking or standing. ??  Able to walk for exercise.     Anticoagulated with Eliquis, denies bleeding issues.   He has 2 caregivers at home 7 days a week for 4 to 6 hours a day  Occasionally has bruises on his face or forearms and the caregiver thinks that he may fall.  He denies it  His sister-in-law lives 2 hours away and usually in contact with the caregivers    Previous:   Feels better with LRL 70 bpm.     Frequent PVCs.     Admitted 12/19/2017 with slurred speech, dizziness, & gait abnormalities for months, then acutely worsened & accompanied by right-sided facial droop. ??Head CT showed possible acute infarction in posterior left frontal lobe. MRI brain showed acute small vessel infarcts. New AF noted during admission.     Dr. Dahlia Client is primary cardiologist.   ??????   Problem List      At high risk for falls ICD-10-CM: Z91.81   ICD-9-CM: V15.88 06/13/2020     Tachycardia-bradycardia syndrome [facility]) ICD-10-CM: I49.5   ICD-9-CM: 427.81 12/21/2017     Paroxysmal atrial fibrillation (HCC) ICD-10-CM: I48.0   ICD-9-CM: 427.31 12/21/2017     Pacemaker ICD-10-CM: Z95.0   ICD-9-CM: V45.01 12/21/2017   Overview Signed 12/21/2017 ??6:34 PM by Thurston Pounds, MD     12/22/2017 St Jude dual chamber         Acute CVA (cerebrovascular accident) [facility]) ICD-10-CM: I63.9   ICD-9-CM: 434.91 12/19/2017               Current Outpatient Medications   Medication Sig Dispense Refill   ?? atorvastatin (LIPITOR) 20 mg tablet TAKE 1 TABLET BY MOUTH EVERY DAY 90 Tablet 0   ?? levothyroxine (SYNTHROID) 50 mcg tablet Take I pill 30 minutes before breakfast 90 Tablet 0   ?? apixaban  (Eliquis) 5 mg tablet TAKE 1 TABLET BY MOUTH TWICE A DAY 60 Tablet 0   ?? donepeziL (ARICEPT) 10 mg tablet Take 5mg  qhs x 1 week, then increase to 10mg  qhs 90 Tab 1   ?? memantine ER (Namenda XR) 28 mg capsule Take 1 Cap by mouth daily. 90 Cap 1   ?? diphenhydrAMINE (Benadryl Allergy) 25 mg tablet Take 25 mg by mouth as needed.   ?? fludrocortisone (FLORINEF) 0.1 mg tablet Take 0.5 Tabs by mouth daily. 90 Tab 3     Allergies   Allergen Reactions   ?? Flu Vaccine Qs2014-15(31mo,Up) Rash   States not an allergy 11/23/19     Past Medical History:   Diagnosis Date   ?? Arrhythmia   ?? Arthritis   ?? Atrial fibrillation (HCC)   ?? Chronic pain   RIGHT HIP   ?? Dementia (HCC)   ?? Hypercholesterolemia   ?? Insomnia   ?? Long term current use of anticoagulant therapy   ?? Pacemaker   ?? Stroke [facility])     Past Surgical History:   Procedure Laterality Date   ?? HX PACEMAKER   ?? PR ABDOMEN SURGERY PROC UNLISTED  Family History   Problem Relation Age of Onset   ?? Cancer Mother   ?? ??BREAST CA   ?? Heart Disease Father   ?? Heart Disease Brother     Social History     Tobacco Use   ?? Smoking status: Current Every Day Smoker   Types: Cigars   ?? Smokeless tobacco: Never Used   Substance Use Topics   ?? Alcohol use: Yes   Comment: 2/WEEK VODKA   ??     Review of Systems: All other review of systems otherwise negative.   Constitutional: Negative for fever, chills, weight loss, malaise/fatigue.   HEENT: Negative for nosebleeds, vision changes.   Respiratory: Negative for cough, hemoptysis   Cardiovascular: Negative for chest pain, palpitations, orthopnea, claudication, leg swelling, syncope, and PND. + occasional lightheadedness with position changes.   Gastrointestinal: Negative for nausea, vomiting, diarrhea, blood in stool and melena.   Genitourinary: Negative for dysuria, and hematuria.   Musculoskeletal: Negative for myalgias, arthralgia.   Skin: Negative for rash.   Heme: Does not bleed or bruise easily.   Neurological: Negative for speech change  and focal weakness   ??   Objective:   Visit Vitals  BP 120/80   Pulse 70   Resp 16   Ht 6' (1.829 m)   Wt 186 lb (84.4 kg)   SpO2 96%   BMI 25.23 kg/m??       Physical Exam:   Constitutional: Well-developed and well-nourished. No respiratory distress.   Head: Normocephalic and atraumatic.   Eyes: Pupils are equal, round.   ENT: Hearing grossly normal.   Neck: Supple. No JVD present.   Cardiovascular: Normal rate, regular rhythm. Exam reveals no gallop and no friction rub. No murmur heard.   Pulmonary/Chest: Effort normal and breath sounds normal. No wheezes.   Abdominal: Soft, no tenderness.   Musculoskeletal: Moves extremities independently. ??Normal gait.   Vasc/lymphatic: No edema.   Neurological: Alert,oriented.   Skin: Skin is warm and dry. ??Left chest pacemaker site well healed.   Psychiatric: Normal mood and affect. Behavior is normal. Judgment and thought content normal. ??       Assessment/Plan:     Imaging/Studies:   Nuclear stress (05/07/2018): LVEF 53%, negative myocardial perfusion imaging.     Echo (12/21/2017): LVEF 65-70%, no RWMA. ??RV mod dilated. ??RA dilated. ??Mild MR. ??Mild to mod TR.   ??   MRI brain (12/19/2017): Punctate foci of diffusion restriction in left frontal lobe compatible with acute small vessel infarcts. No evidence of intracranial hemorrhage or mass.     CTA head & neck (12/19/2017): No evidence large vessel occlusion. Small branch post MCA in sylvian fissure on series 2 image 429, gradually tapers to occlusion.         ICD-10-CM ICD-9-CM    1. Cardiac pacemaker in situ  Z95.0 V45.01    2. NSVT (nonsustained ventricular tachycardia) (HCC)  I47.2 427.1    3. Persistent atrial fibrillation (HCC)  I48.19 427.31    4. Chronic anticoagulation  Z79.01 V58.61    5. Hx of completed stroke  Z86.73 V12.54        St. Jude dual chamber pacemaker (12/22/2017): Device check today shows proper lead & generator function. ??Adequate generator longevity. ??RA <1%, chronic RV pacing.     Chronic AF: Rate is  controlled.     PVCs/NSVT: Asymptomatic PVCs. ??Brief NSVT occasionally noted via pacemaker. ??Nuclear stress test in 2019 showed no ischemia.     Anticoagulation: Continue Eliquis  5 mg po bid. ??Age is >80, but doesn't qualify for reduced dose based on Cr or weight. ??No significant bleeding issues.       Remote pacer checks q 3 months. ??Follow up with EP NP Costella Hatcher) in 6 months, with me in 1 year.     Future Appointments   Date Time Provider Department Center   08/14/2020  8:30 AM Laurence Spates, NP [facility] BS AMB   11/01/2020  1:40 PM Hulan Saas, DO [facility] BS AMB   11/19/2020  2:15 PM REMOTE1, [facility] AMB   02/08/2021 11:00 AM Costella Hatcher B, NP [facility] AMB   02/25/2021  1:45 PM REMOTE1, [facility] AMB   06/03/2021 10:00 AM REMOTE1, [facility] AMB   09/10/2021  3:00 PM PACEMAKER3, [facility] BS AMB   09/10/2021  3:20 PM Thurston Pounds, MD [facility] AMB       Thank you for involving me in this patient's care and please call with further concerns or questions.       Juliet Rude, M.D.   Electrophysiology/Cardiology   [facility]   [address] [place] [address] ?? ?? ?? ?? ?? ?? ?? ?? ?? ?? ??[address]   [place], [place] [address] ?? ?? ?? ?? ?? ?? ?? ?? ?? ?? ?? ?? ?? ?? [place] [address]   [phone] ?? ?? ?? ?? ?? ?? ?? ?? ?? ?? ?? ?? ?? ?? ?? ?? ?? ?? ?? ??[phone]

## 2020-08-07 NOTE — Progress Notes (Signed)
See scanned Pacemaker Report in Chart Review.  Chargeable visit.

## 2020-08-13 NOTE — Telephone Encounter (Signed)
-----   Message from [facility] sent at 08/11/2020  1:43 PM EDT -----  Regarding: Daleen Snook [place]/Telephone  Patient return call    Caller's first and last name and relationship (if not the patient): Chales Abrahams Mcclenathan(Sister-In-Law)      Best contact number(s): [phone]      Whose call is being returned: na      Details to clarify the request: returned call/confirmed appt scheduled 08/14/2020 8:30 am pt will be coming with his caregiver      Baldomero Lamy

## 2020-08-14 ENCOUNTER — Ambulatory Visit: Attending: Family | Primary: Family

## 2020-08-14 ENCOUNTER — Ambulatory Visit: Admit: 2020-08-14 | Payer: MEDICARE | Attending: Family | Primary: Family

## 2020-08-14 DIAGNOSIS — S90211A Contusion of right great toe with damage to nail, initial encounter: Secondary | ICD-10-CM

## 2020-08-14 DIAGNOSIS — N289 Disorder of kidney and ureter, unspecified: Secondary | ICD-10-CM

## 2020-08-14 NOTE — Progress Notes (Signed)
[facility] Note  Subjective:      [name] is a 86 y.o. male who presents for injury to right great toe nail that fell off. The care taker has been cleaning with hydrogen peroxide and applying neosporin.   His kidney function was low, will repeat the lab work today.    Past Medical History:   Diagnosis Date   ??? Arrhythmia    ??? Arthritis    ??? Atrial fibrillation (HCC)    ??? Chronic pain     RIGHT HIP   ??? Dementia (HCC)    ??? Hypercholesterolemia    ??? Insomnia    ??? Long term current use of anticoagulant therapy    ??? Pacemaker    ??? Stroke [facility])      Past Surgical History:   Procedure Laterality Date   ??? HX PACEMAKER     ??? PR ABDOMEN SURGERY PROC UNLISTED         Current Outpatient Medications   Medication Sig Dispense Refill   ??? atorvastatin (LIPITOR) 20 mg tablet TAKE 1 TABLET BY MOUTH EVERY DAY 90 Tablet 0   ??? levothyroxine (SYNTHROID) 50 mcg tablet Take I pill 30 minutes before breakfast 90 Tablet 0   ??? apixaban (Eliquis) 5 mg tablet TAKE 1 TABLET BY MOUTH TWICE A DAY 60 Tablet 0   ??? donepeziL (ARICEPT) 10 mg tablet Take 5mg  qhs x 1 week, then increase to 10mg  qhs 90 Tab 1   ??? memantine ER (Namenda XR) 28 mg capsule Take 1 Cap by mouth daily. 90 Cap 1   ??? diphenhydrAMINE (Benadryl Allergy) 25 mg tablet Take 25 mg by mouth as needed.     ??? fludrocortisone (FLORINEF) 0.1 mg tablet Take 0.5 Tabs by mouth daily. 90 Tab 3     Allergies   Allergen Reactions   ??? Flu Vaccine Qs2014-15(43mo,Up) Rash     States not an allergy 11/23/19       ROS:   Complete review of systems was reviewed with pertinent information listed in HPI.  Review of Systems   Constitutional: Negative.    HENT: Negative.    Respiratory: Negative.    Cardiovascular: Negative.    Gastrointestinal: Negative.    Genitourinary: Negative.        Objective:     Visit Vitals  BP 127/78 (BP 1 Location: Left upper arm, BP Patient Position: Sitting, BP Cuff Size: Adult)   Pulse 76   Temp 97.7 ??F (36.5 ??C) (Temporal)   Resp 16   Ht 6'  (1.829 m)   Wt 188 lb 3.2 oz (85.4 kg)   SpO2 97%   BMI 25.52 kg/m??       Vitals and Nurse Documentation reviewed.     Physical Exam  Constitutional:       Appearance: Normal appearance.   HENT:      Mouth/Throat:      Mouth: Mucous membranes are moist.   Cardiovascular:      Rate and Rhythm: Normal rate and regular rhythm.      Pulses: Normal pulses.      Heart sounds: Normal heart sounds. No murmur heard.     Pulmonary:      Effort: Pulmonary effort is normal.      Breath sounds: Normal breath sounds.   Abdominal:      General: Bowel sounds are normal.      Palpations: Abdomen is soft.   Musculoskeletal:  Cervical back: Normal range of motion and neck supple.      Comments: Rigth great toe nail is missing. Wound is healed  No sign of inflammation or infection   Neurological:      Mental Status: He is alert.   Psychiatric:         Mood and Affect: Mood normal.         Thought Content: Thought content normal.         Assessment/Plan:     Diagnoses and all orders for this visit:    1. Contusion of right great toe with damage to nail, initial encounter     continue with neosporin     Avoid further injury     2. Low kidney function  -     METABOLIC PANEL, BASIC; Future    Follow up in November      Pt expressed understanding with the diagnosis and plan        Discussed expected course/resolution/complications of diagnosis in detail with patient. ??  Medication risks/benefits/costs/interactions/alternatives discussed with patient. ??  Pt was given an after visit summary which includes diagnoses, current medications & vitals. ??Pt expressed understanding with the diagnosis and plan

## 2020-08-14 NOTE — Progress Notes (Signed)
 Chief Complaint   Patient presents with   . Toe Injury     toe nail missing         1. Have you been to the ER, urgent care clinic since your last visit?  Hospitalized since your last visit?No    2. Have you seen or consulted any other health care providers outside of the [facility] since your last visit?  Include any pap smears or colon screening. No    3 most recent PHQ Screens 08/07/2020   Little interest or pleasure in doing things Not at all   Feeling down, depressed, irritable, or hopeless Not at all   Total Score PHQ 2 0

## 2020-09-04 NOTE — Telephone Encounter (Signed)
Received alert for HVR on 09/02/2020, NSVT during AF lasting 4 seconds (13 beats) with max V rate 186 pm.    He has had similar alerts previously.    Nuclear stress test in 2019 showed normal myocardial perfusion, LVEF 53%.    Last seen in clinic in 07/2020.    Continue to monitor.  Should episodes of NSVT increase in frequency or duration, would consider medication change or testing.  Cardiac cath at his age would be unlikely, & he has dementia as well.

## 2020-09-14 ENCOUNTER — Encounter

## 2020-09-14 MED ORDER — APIXABAN 5 MG TABLET
5 mg | ORAL_TABLET | ORAL | 0 refills | Status: DC
Start: 2020-09-14 — End: 2020-10-14

## 2020-09-28 MED ORDER — DONEPEZIL 10 MG TAB
10 mg | ORAL_TABLET | ORAL | 1 refills | Status: DC
Start: 2020-09-28 — End: 2021-02-13

## 2020-09-28 MED ORDER — MEMANTINE 28 MG SPRINKLE ER 24HR CAPSULE
28 mg | ORAL_CAPSULE | ORAL | 1 refills | Status: DC
Start: 2020-09-28 — End: 2021-03-25

## 2020-10-05 ENCOUNTER — Encounter: Attending: Neurology | Primary: Family

## 2020-10-08 NOTE — Telephone Encounter (Signed)
P/T sister wants to be contacted about dementia counseling for her brother.

## 2020-10-13 ENCOUNTER — Encounter

## 2020-10-14 MED ORDER — APIXABAN 5 MG TABLET
5 mg | ORAL_TABLET | ORAL | 0 refills | Status: DC
Start: 2020-10-14 — End: 2020-11-27

## 2020-10-16 ENCOUNTER — Ambulatory Visit: Attending: Family | Primary: Family

## 2020-10-16 ENCOUNTER — Ambulatory Visit: Admit: 2020-10-16 | Payer: MEDICARE | Attending: Family | Primary: Family

## 2020-10-16 ENCOUNTER — Encounter: Attending: Family | Primary: Family

## 2020-10-16 DIAGNOSIS — E781 Pure hyperglyceridemia: Secondary | ICD-10-CM

## 2020-10-16 LAB — COMPREHENSIVE METABOLIC PANEL
ALT: 20 U/L (ref 12–78)
AST: 17 U/L (ref 15–37)
Albumin/Globulin Ratio: 1.4 (ref 1.1–2.2)
Albumin: 3.7 g/dL (ref 3.5–5.0)
Alkaline Phosphatase: 75 U/L (ref 45–117)
Anion Gap: 2 mmol/L — ABNORMAL LOW (ref 5–15)
BUN: 18 MG/DL (ref 6–20)
Bun/Cre Ratio: 14 (ref 12–20)
CO2: 32 mmol/L (ref 21–32)
Calcium: 9.4 MG/DL (ref 8.5–10.1)
Chloride: 108 mmol/L (ref 97–108)
Creatinine: 1.25 MG/DL (ref 0.70–1.30)
EGFR IF NonAfrican American: 55 mL/min/{1.73_m2} — ABNORMAL LOW (ref >60)
GFR African American: >60 mL/min/{1.73_m2} (ref >60)
Globulin: 2.7 g/dL (ref 2.0–4.0)
Glucose: 138 mg/dL — ABNORMAL HIGH (ref 65–100)
Potassium: 4.2 mmol/L (ref 3.5–5.1)
Sodium: 142 mmol/L (ref 136–145)
Total Bilirubin: 0.9 MG/DL (ref 0.2–1.0)
Total Protein: 6.4 g/dL (ref 6.4–8.2)

## 2020-10-16 LAB — CBC
Hematocrit: 43.1 % (ref 36.6–50.3)
Hemoglobin: 14.1 g/dL (ref 12.1–17.0)
MCH: 32.9 PG (ref 26.0–34.0)
MCHC: 32.7 g/dL (ref 30.0–36.5)
MCV: 100.7 FL — ABNORMAL HIGH (ref 80.0–99.0)
MPV: 11.7 FL (ref 8.9–12.9)
NRBC Absolute: 0 10*3/uL (ref 0.00–0.01)
Nucleated RBCs: 0 PER 100 WBC
Platelets: 94 10*3/uL — ABNORMAL LOW (ref 150–400)
RBC: 4.28 M/uL (ref 4.10–5.70)
RDW: 13.3 % (ref 11.5–14.5)
WBC: 5.6 10*3/uL (ref 4.1–11.1)

## 2020-10-16 LAB — TSH 3RD GENERATION
TSH: 1.42 u[IU]/mL (ref 0.36–3.74)
TSH: 1.42 u[IU]/mL (ref 0.36–3.74)

## 2020-10-16 LAB — LIPID PANEL
CHOL/HDL Ratio: 2.8 (ref 0.0–5.0)
Chol/HDL Ratio: 2.8 (ref 0.0–5.0)
Cholesterol, Total: 132 MG/DL (ref <200)
Cholesterol, total: 132 MG/DL (ref <200)
HDL Cholesterol: 47 MG/DL
HDL: 47 MG/DL
LDL Calculated: 65.2 MG/DL (ref 0–100)
LDL, calculated: 65.2 MG/DL (ref 0–100)
Triglyceride: 99 MG/DL (ref <150)
Triglycerides: 99 MG/DL (ref <150)
VLDL Cholesterol Calculated: 19.8 MG/DL
VLDL, calculated: 19.8 MG/DL

## 2020-10-16 LAB — T4, FREE
T4 Free: 1 NG/DL (ref 0.8–1.5)
T4, Free: 1 NG/DL (ref 0.8–1.5)

## 2020-10-16 LAB — METABOLIC PANEL, COMPREHENSIVE
A-G Ratio: 1.4 (ref 1.1–2.2)
ALT (SGPT): 20 U/L (ref 12–78)
AST (SGOT): 17 U/L (ref 15–37)
Albumin: 3.7 g/dL (ref 3.5–5.0)
Alk. phosphatase: 75 U/L (ref 45–117)
Anion gap: 2 mmol/L — ABNORMAL LOW (ref 5–15)
BUN/Creatinine ratio: 14 (ref 12–20)
BUN: 18 MG/DL (ref 6–20)
Bilirubin, total: 0.9 MG/DL (ref 0.2–1.0)
CO2: 32 mmol/L (ref 21–32)
Calcium: 9.4 MG/DL (ref 8.5–10.1)
Chloride: 108 mmol/L (ref 97–108)
Creatinine: 1.25 MG/DL (ref 0.70–1.30)
GFR est AA: >60 mL/min/{1.73_m2} (ref >60)
GFR est non-AA: 55 mL/min/{1.73_m2} — ABNORMAL LOW (ref >60)
Globulin: 2.7 g/dL (ref 2.0–4.0)
Glucose: 138 mg/dL — ABNORMAL HIGH (ref 65–100)
Potassium: 4.2 mmol/L (ref 3.5–5.1)
Protein, total: 6.4 g/dL (ref 6.4–8.2)
Sodium: 142 mmol/L (ref 136–145)

## 2020-10-16 LAB — CBC W/O DIFF
ABSOLUTE NRBC: 0 10*3/uL (ref 0.00–0.01)
HCT: 43.1 % (ref 36.6–50.3)
HGB: 14.1 g/dL (ref 12.1–17.0)
MCH: 32.9 PG (ref 26.0–34.0)
MCHC: 32.7 g/dL (ref 30.0–36.5)
MCV: 100.7 FL — ABNORMAL HIGH (ref 80.0–99.0)
MPV: 11.7 FL (ref 8.9–12.9)
NRBC: 0 PER 100 WBC
PLATELET: 94 10*3/uL — ABNORMAL LOW (ref 150–400)
RBC: 4.28 M/uL (ref 4.10–5.70)
RDW: 13.3 % (ref 11.5–14.5)
WBC: 5.6 10*3/uL (ref 4.1–11.1)

## 2020-10-16 NOTE — Progress Notes (Signed)
[name] is a 86 y.o. male    Chief Complaint   Patient presents with   . Follow-up     HTN; Labs;        1. Have you been to the ER, urgent care clinic since your last visit?  Hospitalized since your last visit?No    2. Have you seen or consulted any other health care providers outside of the [facility] since your last visit?  Include any pap smears or colon screening. No    Visit Vitals  BP 127/74 (BP 1 Location: Left upper arm, BP Patient Position: Sitting)   Pulse 69   Temp 97.3 F (36.3 C) (Temporal)   Ht 6\' 1"  (1.854 m)   Wt 186 lb (84.4 kg)   SpO2 96%   BMI 24.54 kg/m

## 2020-10-16 NOTE — Progress Notes (Signed)
[facility] Note  Subjective:      [name] is a 86 y.o. male who presents for follow up on chronic problems. He has history of hyperlipidemia, hypothyroidism and dementia. He is followed by a neurologist.  Today he is complaining of ingrown toe nail and care take requesting referral to podiatrist .    Past Medical History:   Diagnosis Date   ??? Arrhythmia    ??? Arthritis    ??? Atrial fibrillation (HCC)    ??? Chronic pain     RIGHT HIP   ??? Dementia (HCC)    ??? Hypercholesterolemia    ??? Insomnia    ??? Long term current use of anticoagulant therapy    ??? Pacemaker    ??? Stroke [facility])      Past Surgical History:   Procedure Laterality Date   ??? HX PACEMAKER     ??? PR ABDOMEN SURGERY PROC UNLISTED         Current Outpatient Medications   Medication Sig Dispense Refill   ??? apixaban (Eliquis) 5 mg tablet TAKE 1 TABLET BY MOUTH TWICE A DAY(MUST MAKE APPT W/MD FOR FURTHER FILLS) 60 Tablet 0   ??? memantine ER (NAMENDA XR) 28 mg capsule TAKE 1 CAPSULE BY MOUTH EVERY DAY 90 Capsule 1   ??? donepeziL (ARICEPT) 10 mg tablet TAKE 1/2 TAB BY MOUTH AT BEDTIME FOR 1 WEEK THEN INCREASE TO 1 TAB BY MOUTH AT BEDTIME 90 Tablet 1   ??? atorvastatin (LIPITOR) 20 mg tablet TAKE 1 TABLET BY MOUTH EVERY DAY 90 Tablet 0   ??? levothyroxine (SYNTHROID) 50 mcg tablet Take I pill 30 minutes before breakfast 90 Tablet 0   ??? diphenhydrAMINE (Benadryl Allergy) 25 mg tablet Take 25 mg by mouth as needed.     ??? fludrocortisone (FLORINEF) 0.1 mg tablet Take 0.5 Tabs by mouth daily. 90 Tab 3     Allergies   Allergen Reactions   ??? Flu Vaccine Qs2014-15(27mo,Up) Rash     States not an allergy 11/23/19       ROS:   Complete review of systems was reviewed with pertinent information listed in HPI.  Review of Systems   Constitutional: Negative.    HENT: Negative.    Respiratory: Negative.    Cardiovascular: Negative.    Gastrointestinal: Negative.    Genitourinary: Negative.        Objective:     Visit Vitals  BP 127/74 (BP 1 Location: Left  upper arm, BP Patient Position: Sitting)   Pulse 69   Temp 97.3 ??F (36.3 ??C) (Temporal)   Ht 6\' 1"  (1.854 m)   Wt 186 lb (84.4 kg)   SpO2 96%   BMI 24.54 kg/m??       Vitals and Nurse Documentation reviewed.     Physical Exam  Constitutional:       Appearance: Normal appearance.   HENT:      Mouth/Throat:      Mouth: Mucous membranes are moist.   Cardiovascular:      Rate and Rhythm: Normal rate and regular rhythm.      Pulses: Normal pulses.      Heart sounds: Normal heart sounds. No murmur heard.     Pulmonary:      Effort: Pulmonary effort is normal.      Breath sounds: Normal breath sounds.   Abdominal:      General: Bowel sounds are normal.      Palpations: Abdomen is soft.  Musculoskeletal:      Cervical back: Normal range of motion and neck supple.   Skin:     Comments: Thick, ingrown toe nails   Neurological:      Mental Status: He is alert.   Psychiatric:         Mood and Affect: Mood normal.         Thought Content: Thought content normal.         Assessment/Plan:     Diagnoses and all orders for this visit:    1. Pure hyperglyceridemia  -     LIPID PANEL; Future  -     METABOLIC PANEL, COMPREHENSIVE; Future    2. Acquired hypothyroidism  -     T4, FREE; Future  -     TSH 3RD GENERATION; Future    3. Abnormal CBC  -     CBC W/O DIFF; Future    4. IGTN (ingrowing toe nail)  -     REFERRAL TO PODIATRY          Pt expressed understanding with the diagnosis and plan        Discussed expected course/resolution/complications of diagnosis in detail with patient. ??  Medication risks/benefits/costs/interactions/alternatives discussed with patient. ??  Pt was given an after visit summary which includes diagnoses, current medications & vitals. ??Pt expressed understanding with the diagnosis and plan

## 2020-10-21 ENCOUNTER — Encounter

## 2020-10-22 MED ORDER — LEVOTHYROXINE 50 MCG TAB
50 mcg | ORAL_TABLET | ORAL | 0 refills | Status: DC
Start: 2020-10-22 — End: 2021-01-15

## 2020-11-01 ENCOUNTER — Ambulatory Visit: Attending: Neurology | Primary: Family

## 2020-11-01 ENCOUNTER — Ambulatory Visit: Admit: 2020-11-01 | Payer: MEDICARE | Attending: Neurology | Primary: Family

## 2020-11-01 DIAGNOSIS — G301 Alzheimer's disease with late onset: Secondary | ICD-10-CM

## 2020-11-01 NOTE — Progress Notes (Signed)
Neurology Clinic Follow up Note    Patient ID:  [name]  [id number]  86 y.o.  02/12/1932      [name] is here for follow up today of stroke       Last Appointment With Me:  04/03/2020    Interval History:   Patient is here for f/u of stroke.  Here for f/u of L MCA small territory infarct, L M2/3 occlusion not amenable to endovascular intervention. ??Mechanism likely cardioembolic in setting of Afib. S/P PPM last hospitalization.    No new deficits/focal weakness/numbness/vision loss.  He remains on Eliquis for Afib/stroke prevention.  No reported side effects/hemorrhage.    He reports memory is stable/unchanged from last visit.  His caregiver reports he is having some increasing difficulty pertaining to the following:  -managing financial accounts-he has assistance with finances currently  -difficulty using his phone  -loses credit cards  -repeating himself often  -needs assistance with medication administration    Denies falls but reports some imbalance.     He appears to be tolerating Aricept and Namenda well without side effects.   He has caregivers daily in the mornings only.   He is not driving.    No reported behavioral concerns.     PMHx/ PSHx/ FHx/ SHx:  Reviewed and unchanged previous visit.   Past Medical History:   Diagnosis Date   ??? Arrhythmia    ??? Arthritis    ??? Atrial fibrillation ([place])    ??? Chronic pain     RIGHT HIP   ??? Dementia ([place])    ??? Hypercholesterolemia    ??? Insomnia    ??? Long term current use of anticoagulant therapy    ??? Pacemaker    ??? Stroke ([place])          ROS:  Comprehensive review of systems negative except for as noted above.       Objective:       Meds:  Current Outpatient Medications   Medication Sig Dispense Refill   ??? levothyroxine (SYNTHROID) 50 mcg tablet TAKE 1 TABLET BY MOUTH 30 MINUTES BEFORE BREAKFAST 90 Tablet 0   ??? apixaban (Eliquis) 5 mg tablet TAKE 1 TABLET BY MOUTH TWICE A DAY(MUST MAKE APPT W/MD FOR FURTHER FILLS) 60 Tablet 0   ??? memantine ER (NAMENDA XR) 28 mg  capsule TAKE 1 CAPSULE BY MOUTH EVERY DAY 90 Capsule 1   ??? donepeziL (ARICEPT) 10 mg tablet TAKE 1/2 TAB BY MOUTH AT BEDTIME FOR 1 WEEK THEN INCREASE TO 1 TAB BY MOUTH AT BEDTIME 90 Tablet 1   ??? atorvastatin (LIPITOR) 20 mg tablet TAKE 1 TABLET BY MOUTH EVERY DAY 90 Tablet 0   ??? diphenhydrAMINE (Benadryl Allergy) 25 mg tablet Take 25 mg by mouth as needed.     ??? fludrocortisone (FLORINEF) 0.1 mg tablet Take 0.5 Tabs by mouth daily. 90 Tab 3       Exam:  Visit Vitals  BP 120/78   Pulse 72   Resp 18   Ht '6\' 1"'  (1.854 m)   Wt 190 lb 9.6 oz (86.5 kg)   SpO2 98%   BMI 25.15 kg/m??      NEUROLOGICAL EXAM:  General: Awake, alert, speech fluent. Oriented to self, place, month/year not date. Serial 7's intact. Delayed recall impaired.   CN: PERRL, EOMI without nystagmus, VFF to confrontation, facial sensation and strength are normal and symmetric, hearing is intact to finger rub bilaterally, palate and tongue movements are intact and symmetric.  Motor: Normal tone, bulk and strength bilaterally.  Reflexes: Deferred  Coordination: FNF, RAM, HTS intact.  Sensation: LT intact throughout.  Gait: Slightly unsteady    LABS  Results for orders placed or performed in visit on 10/16/20   CBC W/O DIFF   Result Value Ref Range    WBC 5.6 4.1 - 11.1 K/uL    RBC 4.28 4.10 - 5.70 M/uL    HGB 14.1 12.1 - 17.0 g/dL    HCT 43.1 36.6 - 50.3 %    MCV 100.7 (H) 80.0 - 99.0 FL    MCH 32.9 26.0 - 34.0 PG    MCHC 32.7 30.0 - 36.5 g/dL    RDW 13.3 11.5 - 14.5 %    PLATELET 94 (L) 150 - 400 K/uL    MPV 11.7 8.9 - 12.9 FL    NRBC 0.0 0 PER 100 WBC    ABSOLUTE NRBC 0.00 0.00 - 0.01 K/uL   TSH 3RD GENERATION   Result Value Ref Range    TSH 1.42 0.36 - 3.74 uIU/mL   T4, FREE   Result Value Ref Range    T4, Free 1.0 0.8 - 1.5 NG/DL   METABOLIC PANEL, COMPREHENSIVE   Result Value Ref Range    Sodium 142 136 - 145 mmol/L    Potassium 4.2 3.5 - 5.1 mmol/L    Chloride 108 97 - 108 mmol/L    CO2 32 21 - 32 mmol/L    Anion gap 2 (L) 5 - 15 mmol/L    Glucose 138  (H) 65 - 100 mg/dL    BUN 18 6 - 20 MG/DL    Creatinine 1.25 0.70 - 1.30 MG/DL    BUN/Creatinine ratio 14 12 - 20      GFR est AA >60 >60 ml/min/1.62m    GFR est non-AA 55 (L) >60 ml/min/1.730m   Calcium 9.4 8.5 - 10.1 MG/DL    Bilirubin, total 0.9 0.2 - 1.0 MG/DL    ALT (SGPT) 20 12 - 78 U/L    AST (SGOT) 17 15 - 37 U/L    Alk. phosphatase 75 45 - 117 U/L    Protein, total 6.4 6.4 - 8.2 g/dL    Albumin 3.7 3.5 - 5.0 g/dL    Globulin 2.7 2.0 - 4.0 g/dL    A-G Ratio 1.4 1.1 - 2.2     LIPID PANEL   Result Value Ref Range    Cholesterol, total 132 <200 MG/DL    Triglyceride 99 <150 MG/DL    HDL Cholesterol 47 MG/DL    LDL, calculated 65.2 0 - 100 MG/DL    VLDL, calculated 19.8 MG/DL    CHOL/HDL Ratio 2.8 0.0 - 5.0         IMAGING:  MRI Results (most recent):  Results from Hospital Encounter encounter on 12/19/17   MRI BRAIN WO CONT    Narrative *PRELIMINARY REPORT*    Punctate foci of diffusion restriction in the left frontal lobe are compatible  with acute small vessel infarcts. No evidence of intracranial hemorrhage or  mass. Mild periventricular and subcortical white matter disease, compatible with  chronic small vessel ischemia.    Preliminary report was provided by Dr. VaSharol Rousselthe on-call radiologist, at 1:30  PM.    Final report to follow.    *END PRELIMINARY REPORT*    EXAM:  MRI BRAIN WO CONT  History: Dysarthria  INDICATION:  Dysarthria    COMPARISON: CTA 12/19/2017.    CONTRAST:  None.  TECHNIQUE: Sagittal T1, axial FLAIR, T2,T1 and gradient echo images as well as  coronal T2 weighted images and axial diffusion weighted images of the head were  obtained.    FINDINGS:      Small less than 5 mm foci of cortically based infarction in the left frontal  lobe. Anterior division MCA vascular territory. No other foci of infarction. No  Chiari or syrinx. Pituitary infundibulum unremarkable. Scattered foci of  increased T2 signal intensity corona radiata and centrum semiovale. Small remote  lacunar infarctions in the  cerebral white matter on the right and on the left.  Mucoperiosteal thickening in the left maxillary sinus. Left frontal sinus  disease. Sphenoid sinus disease as well. Trace fluid in the vascular cells.  There is no evidence of mass, hemorrhage or abnormal extra-axial fluid  collection.  Normal appearing flow-voids are present in the vertebral, basilar  and carotid artery systems. The craniocervical junction is normal.     Impression IMPRESSION:     Small acute cortical infarction left MCA M2 anterior division segment.  Mild paranasal sinus disease. Greater on the left.  Mild chronic microvascular ischemic change with additional scattered remote  lacunar infarctions in the cerebral white matter. Mild cerebral atrophy.  Correlates with CTA findings of occluded small left MCA territory segmental  branch.                 Assessment:     Encounter Diagnoses     ICD-10-CM ICD-9-CM   1. Late onset Alzheimer's dementia without behavioral disturbance (HCC)  G30.1 331.0    F02.80 294.10   2. Cerebrovascular accident (CVA), unspecified mechanism ([place])  I63.9 434.91   3. Paroxysmal atrial fibrillation (HCC)  I48.0 427.31   4. Gait instability  R26.81 65.58      86 year old RHM here for f/u of L MCA embolic appearing small territory infarct, L M2/3 occlusion not amenable to endovascular intervention in the setting of atrial fibrillation diagnosed on prior admission.  He is doing well post discharge from a stroke perspective without residual focal deficits.  Remains on NOAC/Eliquis and statin therapy.      Regarding his memory, caregivers have noted a decline over the past year in terms of executive functioning and recall. He is having more difficulty maintaining his finances. Previous [place] 17/30 with more prominent executive dysfunction, disorientation. We discussed need for assistance with finances and executive decisions moving forward due to evidence of moderate dementia on examination. Suspect AD subtype. Will continue  titration of Aricept to assist with memory deficits and continue Namenda as prescribed, which he appears to be tolerating well. We discussed that he may benefit from more frequent visits to assist with medication administration. He was advised to abstain from driving following recent OT driving evaluation. Lastly, discussed home PT/OT services due to gait instability to prevent falls.   Plan:   Increase Aricept to 68m qhs, continue Namenda XR 267mdaily  Cont. Eliquis for stroke prevention  Cont. Statin therapy  Goal SBP<140, LDL<70   Home health/memory care services  Recommend caregivers daily to assist with medication administration  Recommend assistance with finances/executive decisions        Follow-up and Dispositions    ?? Return in about 6 months (around 05/01/2021).         Signed:  RaOrson ApeDO  11/01/2020

## 2020-11-01 NOTE — Progress Notes (Signed)
 KINAN SAFLEY is a 86 y.o. male  HIPAA verified by two patient identifiers.   Health Maintenance Due   Topic   . COVID-19 Vaccine (1)   . Shingrix Vaccine Age 35> (1 of 2)     Chief Complaint   Patient presents with   . Memory Loss     F/U     Visit Vitals  BP 120/78   Pulse 72   Resp 18   Ht 6' 1 (1.854 m)   Wt 190 lb 9.6 oz (86.5 kg)   SpO2 98%   BMI 25.15 kg/m       Pain Scale: 0 - No pain/10  Pain Location:   1. Have you been to the ER, urgent care clinic since your last visit?  Hospitalized since your last visit?No    2. Have you seen or consulted any other health care providers outside of the [facility] since your last visit?  Include any pap smears or colon screening. No

## 2020-11-02 NOTE — Telephone Encounter (Signed)
Amber w/ [facility] health calling stating she received a referral for patient and she needs verbal order to start on 11/22. Please call

## 2020-11-05 NOTE — Telephone Encounter (Signed)
-----   Message from Nelva Nay III sent at 11/05/2020  9:02 AM EST -----  Regarding: Dr.Donaldson/telephone  General Message/Vendor Calls    Caller's first and last name: [facility]       Reason for call: Care Date       Callback required yes/no and why: Yes to Confirm       Best contact number(s): [phone]      Details to clarify the request: Pt needs a Start of Care Date 11/24 Call back With a Verbal  please       Nelva Nay III

## 2020-11-05 NOTE — Telephone Encounter (Signed)
Amber calling back wanting the Start date to be 11/13/2020 instead of 11/07/2020. Please call back with verbal.

## 2020-11-05 NOTE — Telephone Encounter (Signed)
Spoke with [facility], she is requesting the start date be 11/24 instead of today. Informed her Dr.Donaldson stated that was okay

## 2020-11-05 NOTE — Telephone Encounter (Signed)
Spoke with [facility], informed her 11/30 was okay to start home health.

## 2020-11-13 ENCOUNTER — Encounter: Admit: 2020-11-13 | Discharge: 2020-11-13 | Payer: MEDICARE | Primary: Family

## 2020-11-13 ENCOUNTER — Encounter: Primary: Family

## 2020-11-14 ENCOUNTER — Encounter: Admit: 2020-11-14 | Discharge: 2020-11-14 | Payer: MEDICARE | Primary: Family

## 2020-11-16 ENCOUNTER — Encounter: Admit: 2020-11-16 | Discharge: 2020-11-16 | Payer: MEDICARE | Primary: Family

## 2020-11-19 ENCOUNTER — Ambulatory Visit: Primary: Family

## 2020-11-19 ENCOUNTER — Ambulatory Visit: Admit: 2020-11-19 | Discharge: 2020-11-19 | Payer: MEDICARE | Primary: Family

## 2020-11-19 DIAGNOSIS — Z950 Presence of cardiac pacemaker: Secondary | ICD-10-CM

## 2020-11-19 NOTE — Progress Notes (Signed)
See scanned Pacemaker Report in Chart Review.  Chargeable visit.

## 2020-11-20 ENCOUNTER — Telehealth

## 2020-11-20 ENCOUNTER — Encounter: Admit: 2020-11-20 | Discharge: 2020-11-20 | Payer: MEDICARE | Primary: Family

## 2020-11-20 NOTE — Telephone Encounter (Signed)
Verified patient with two types of identifiers. Spoke with patient's sister in law, Denyse Dago, verified on PHI. Notified of device findings and MD recommendations. SIL requested echo be after Christmas. Scheduled Echocardiogram. Patient's SIL verbalized understanding and will call with any other questions.      Future Appointments   Date Time Provider Department Center   11/20/2020  3:00 PM Elonda Husky [facility]   11/22/2020 To Be Determined Elonda Husky [facility]   11/27/2020 To Be Determined Elonda Husky [facility]   11/29/2020 To Be Determined Elonda Husky [facility]   12/03/2020 To Be Determined Elonda Husky [facility]   12/05/2020 To Be Determined Elonda Husky [facility]   12/19/2020 10:00 AM VASCULAR, REYNOLDS [facility]   02/08/2021 11:00 AM French Ana, NP [facility]   02/25/2021  1:45 PM REMOTE1, [facility]   04/16/2021  9:30 AM Laurence Spates, NP [facility]   05/02/2021 10:40 AM Hulan Saas, DO [facility]   06/03/2021 10:00 AM REMOTE1, [facility]   09/10/2021  3:00 PM PACEMAKER3, REYNOLDS [facility]   09/10/2021  3:20 PM Thurston Pounds, MD [facility]

## 2020-11-20 NOTE — Telephone Encounter (Signed)
 Per Dr. Jiles Pacemaker check on November 19, 2020 showed episodes of nonsustained ventricular tachycardia and atrial fibrillation.  Patient is on Eliquis.  Last stress test in 2019 with normal ejection fraction and no ischemia.  Patient is 97% RV pacing.  Recommend 2D echocardiogram for left ventricular function    Attempted to reach patient by telephone. A message was left for return call.

## 2020-11-20 NOTE — Progress Notes (Signed)
Pacemaker check on November 19, 2020 showed episodes of nonsustained ventricular tachycardia and atrial fibrillation.  Patient is on Eliquis.  Last stress test in 2019 with normal ejection fraction and no ischemia.  Patient is 97% RV pacing.  Recommend 2D echocardiogram for left ventricular function

## 2020-11-21 ENCOUNTER — Encounter: Attending: Internal Medicine | Primary: Family

## 2020-11-22 ENCOUNTER — Encounter: Admit: 2020-11-22 | Discharge: 2020-11-22 | Payer: MEDICARE | Primary: Family

## 2020-11-26 ENCOUNTER — Encounter: Admit: 2020-11-26 | Discharge: 2020-11-26 | Payer: MEDICARE | Primary: Family

## 2020-11-26 ENCOUNTER — Encounter

## 2020-11-27 ENCOUNTER — Encounter: Primary: Family

## 2020-11-27 MED ORDER — APIXABAN 5 MG TABLET
5 mg | ORAL_TABLET | ORAL | 0 refills | Status: DC
Start: 2020-11-27 — End: 2020-12-26

## 2020-11-29 ENCOUNTER — Encounter: Admit: 2020-11-29 | Discharge: 2020-11-29 | Payer: MEDICARE | Primary: Family

## 2020-12-03 ENCOUNTER — Encounter: Primary: Family

## 2020-12-04 ENCOUNTER — Encounter: Primary: Family

## 2020-12-04 ENCOUNTER — Encounter: Admit: 2020-12-04 | Discharge: 2020-12-04 | Payer: MEDICARE | Primary: Family

## 2020-12-05 ENCOUNTER — Encounter: Primary: Family

## 2020-12-11 ENCOUNTER — Encounter: Primary: Family

## 2020-12-19 ENCOUNTER — Ambulatory Visit

## 2020-12-19 ENCOUNTER — Ambulatory Visit: Admit: 2020-12-19 | Discharge: 2020-12-19 | Payer: MEDICARE | Primary: Family

## 2020-12-19 ENCOUNTER — Encounter: Payer: MEDICARE | Attending: Family | Primary: Family

## 2020-12-19 DIAGNOSIS — I472 Ventricular tachycardia: Secondary | ICD-10-CM

## 2020-12-19 NOTE — Progress Notes (Signed)
Slight decrease in LVEF but not yet need biv pacer upgrade

## 2020-12-21 LAB — TRANSTHORACIC ECHOCARDIOGRAM (TTE) COMPLETE (CONTRAST/BUBBLE/3D PRN)
AV Area by Peak Velocity: 2 cm2
AV Area by Peak Velocity: 2 cm2
AV Area by VTI: 2.1 cm2
AV Area by VTI: 2.1 cm2
AV Mean Gradient: 2 mmHg
AV Mean Velocity: 0.6 m/s
AV Peak Gradient: 3 mmHg
AV Peak Velocity: 0.9 m/s
AV VTI: 17.4 cm
AV Velocity Ratio: 0.67
Ao Root Index: 1.71 cm/m2
Aortic Root: 3.6 cm
Ascending Aorta Index: 1.94 cm/m2
Ascending Aorta: 4.1 cm
EF BP: 50 % — AB (ref 55–100)
Fractional Shortening 2D: 33 % (ref 28–44)
IVSd: 1.4 cm — AB (ref 0.6–1)
LA Diameter: 4.7 cm
LA Size Index: 2.23 cm/m2
LA Volume 2C: 85 mL — AB (ref 18–58)
LA Volume 4C: 84 mL — AB (ref 18–58)
LA Volume A/L: 100 mL
LA Volume BP: 91 mL — AB (ref 18–58)
LA Volume Index 2C: 40 mL/m2 — AB (ref 16–34)
LA Volume Index 4C: 40 mL/m2 — AB (ref 16–34)
LA Volume Index A/L: 47 mL/m2 (ref 16–34)
LA Volume Index BP: 43 ml/m2 — AB (ref 16–34)
LA/AO Root Ratio: 1.31
LV E' Septal Velocity: 6 cm/s
LV EDV A2C: 75 mL
LV EDV A4C: 104 mL
LV EDV BP: 89 mL (ref 67–155)
LV EDV Index A2C: 36 mL/m2
LV EDV Index A4C: 49 mL/m2
LV EDV Index BP: 42 mL/m2
LV ESV A2C: 42 mL
LV ESV A4C: 48 mL
LV ESV BP: 45 mL (ref 22–58)
LV ESV Index A2C: 20 mL/m2
LV ESV Index A4C: 23 mL/m2
LV ESV Index BP: 21 mL/m2
LV Ejection Fraction A2C: 43 %
LV Ejection Fraction A4C: 54 %
LV Mass 2D Index: 117.7 g/m2 — AB (ref 49–115)
LV Mass 2D: 248.4 g — AB (ref 88–224)
LV RWT Ratio: 0.62
LVIDd Index: 2.13 cm/m2
LVIDd: 4.5 cm (ref 4.2–5.9)
LVIDs Index: 1.42 cm/m2
LVIDs: 3 cm
LVOT Area: 3.1 cm2
LVOT Diameter: 2 cm
LVOT Mean Gradient: 1 mmHg
LVOT Peak Gradient: 1 mmHg
LVOT Peak Velocity: 0.6 m/s
LVOT SV: 37.4 ml
LVOT Stroke Volume Index: 17.7 mL/m2
LVOT VTI: 11.9 cm
LVOT:AV VTI Index: 0.68
LVPWd: 1.4 cm — AB (ref 0.6–1)
Left Ventricular Ejection Fraction: 53
PASP: 30 mmHg
RVIDd: 4.5 cm
TAPSE: 2.4 cm — AB (ref 1.5–2)
TR Max Velocity: 2.46 m/s
TR Peak Gradient: 24 mmHg

## 2020-12-21 LAB — ECHO ADULT COMPLETE
AV Area by Peak Velocity: 2 cm2
AV Area by Peak Velocity: 2 cm2
AV Area by VTI: 2.1 cm2
AV Area by VTI: 2.1 cm2
AV Mean Gradient: 2 mmHg
AV Mean Velocity: 0.6 m/s
AV Peak Gradient: 3 mmHg
AV Peak Velocity: 0.9 m/s
AV VTI: 17.4 cm
AV Velocity Ratio: 0.67
Ao Root Index: 1.71 cm/m2
Aortic Root: 3.6 cm
Ascending Aorta Index: 1.94 cm/m2
Ascending Aorta: 4.1 cm
EF BP: 50 % — AB (ref 55–100)
Fractional Shortening 2D: 33 % (ref 28–44)
IVSd: 1.4 cm — AB (ref 0.6–1.0)
LA Diameter: 4.7 cm
LA Size Index: 2.23 cm/m2
LA Volume 2C: 85 mL — AB (ref 18–58)
LA Volume 4C: 84 mL — AB (ref 18–58)
LA Volume A/L: 100 mL
LA Volume BP: 91 mL — AB (ref 18–58)
LA Volume Index 2C: 40 mL/m2 — AB (ref 16–34)
LA Volume Index 4C: 40 mL/m2 — AB (ref 16–34)
LA Volume Index A/L: 47 mL/m2 (ref 16–34)
LA Volume Index BP: 43 ml/m2 — AB (ref 16–34)
LA/AO Root Ratio: 1.31
LV E' Septal Velocity: 6 cm/s
LV EDV A2C: 75 mL
LV EDV A4C: 104 mL
LV EDV BP: 89 mL (ref 67–155)
LV EDV Index A2C: 36 mL/m2
LV EDV Index A4C: 49 mL/m2
LV EDV Index BP: 42 mL/m2
LV ESV A2C: 42 mL
LV ESV A4C: 48 mL
LV ESV BP: 45 mL (ref 22–58)
LV ESV Index A2C: 20 mL/m2
LV ESV Index A4C: 23 mL/m2
LV ESV Index BP: 21 mL/m2
LV Ejection Fraction A2C: 43 %
LV Ejection Fraction A4C: 54 %
LV Mass 2D Index: 117.7 g/m2 — AB (ref 49–115)
LV Mass 2D: 248.4 g — AB (ref 88–224)
LV RWT Ratio: 0.62
LVIDd Index: 2.13 cm/m2
LVIDd: 4.5 cm (ref 4.2–5.9)
LVIDs Index: 1.42 cm/m2
LVIDs: 3 cm
LVOT Area: 3.1 cm2
LVOT Diameter: 2 cm
LVOT Mean Gradient: 1 mmHg
LVOT Peak Gradient: 1 mmHg
LVOT Peak Velocity: 0.6 m/s
LVOT SV: 37.4 ml
LVOT Stroke Volume Index: 17.7 mL/m2
LVOT VTI: 11.9 cm
LVOT:AV VTI Index: 0.68
LVPWd: 1.4 cm — AB (ref 0.6–1.0)
PASP: 30 mmHg
RVIDd: 4.5 cm
TAPSE: 2.4 cm — AB (ref 1.5–2.0)
TR Max Velocity: 2.46 m/s
TR Peak Gradient: 24 mmHg

## 2020-12-24 NOTE — Telephone Encounter (Signed)
-----   Message from Thurston Pounds, MD sent at 12/24/2020 12:57 PM EST -----  Slight decrease in LVEF but not yet need biv pacer upgrade

## 2020-12-24 NOTE — Telephone Encounter (Signed)
Verified patient with two types of identifiers.  Spoke with Delorise Shiner, listed on PHI Release.  Provided echo result and recommendation of Dr. Loma Newton.  Ms. Kepner verbalized understanding and will call with any other questions.

## 2020-12-26 ENCOUNTER — Ambulatory Visit: Attending: Family | Primary: Family

## 2020-12-26 ENCOUNTER — Ambulatory Visit: Admit: 2020-12-26 | Payer: MEDICARE | Attending: Family | Primary: Family

## 2020-12-26 DIAGNOSIS — Z950 Presence of cardiac pacemaker: Secondary | ICD-10-CM

## 2020-12-26 DIAGNOSIS — I480 Paroxysmal atrial fibrillation: Secondary | ICD-10-CM

## 2020-12-26 MED ORDER — APIXABAN 5 MG TABLET
5 mg | ORAL_TABLET | ORAL | 3 refills | Status: DC
Start: 2020-12-26 — End: 2021-05-07

## 2020-12-26 NOTE — Progress Notes (Signed)
[facility] Note  Subjective:      [name] is a 87 y.o. male who presents for refill medication, he has history of dementia,Atrial fibrillation, Pace maker and long term use of anticoagulant. Taking medications as prescribed, no medication side effects reported. He is accompanied by his care taker    Past Medical History:   Diagnosis Date   ??? Arrhythmia    ??? Arthritis    ??? Atrial fibrillation (HCC)    ??? Chronic pain     RIGHT HIP   ??? Dementia (HCC)    ??? Hypercholesterolemia    ??? Insomnia    ??? Long term current use of anticoagulant therapy    ??? Pacemaker    ??? Stroke [facility])      Past Surgical History:   Procedure Laterality Date   ??? HX PACEMAKER     ??? PR ABDOMEN SURGERY PROC UNLISTED         Current Outpatient Medications   Medication Sig Dispense Refill   ??? apixaban (Eliquis) 5 mg tablet TAKE 1 TABLET BY MOUTH TWICE A DAY(MUST MAKE APPT W/MD FOR FURTHER FILLS) 60 Tablet 3   ??? levothyroxine (SYNTHROID) 50 mcg tablet TAKE 1 TABLET BY MOUTH 30 MINUTES BEFORE BREAKFAST 90 Tablet 0   ??? memantine ER (NAMENDA XR) 28 mg capsule TAKE 1 CAPSULE BY MOUTH EVERY DAY 90 Capsule 1   ??? donepeziL (ARICEPT) 10 mg tablet TAKE 1/2 TAB BY MOUTH AT BEDTIME FOR 1 WEEK THEN INCREASE TO 1 TAB BY MOUTH AT BEDTIME 90 Tablet 1   ??? atorvastatin (LIPITOR) 20 mg tablet TAKE 1 TABLET BY MOUTH EVERY DAY 90 Tablet 0   ??? diphenhydrAMINE (Benadryl Allergy) 25 mg tablet Take 25 mg by mouth as needed for Allergies.     ??? fludrocortisone (FLORINEF) 0.1 mg tablet Take 0.5 Tabs by mouth daily. 90 Tab 3     Allergies   Allergen Reactions   ??? Flu Vaccine [id number](32mo,Up) Rash     States not an allergy 11/23/19       ROS:   Complete review of systems was reviewed with pertinent information listed in HPI.  Review of Systems   Constitutional: Negative.    HENT: Negative.    Respiratory: Negative.    Cardiovascular: Negative.    Gastrointestinal: Negative.    Genitourinary: Negative.        Objective:     Visit Vitals  BP 123/74  (BP 1 Location: Left upper arm, BP Patient Position: Sitting, BP Cuff Size: Adult)   Pulse 70   Temp 97.4 ??F (36.3 ??C) (Temporal)   Resp 16   Ht 6\' 1"  (1.854 m)   Wt 186 lb 9.6 oz (84.6 kg)   SpO2 98%   BMI 24.62 kg/m??       Vitals and Nurse Documentation reviewed.     Physical Exam  Constitutional:       Appearance: Normal appearance.   HENT:      Mouth/Throat:      Mouth: Mucous membranes are moist.   Cardiovascular:      Rate and Rhythm: Normal rate and regular rhythm.      Pulses: Normal pulses.      Heart sounds: Normal heart sounds. No murmur heard.      Pulmonary:      Effort: Pulmonary effort is normal.      Breath sounds: Normal breath sounds.   Abdominal:      Palpations: Abdomen is  soft.   Musculoskeletal:      Cervical back: Normal range of motion and neck supple.   Neurological:      Mental Status: He is alert.   Psychiatric:         Mood and Affect: Mood normal.         Thought Content: Thought content normal.         Assessment/Plan:     Diagnoses and all orders for this visit:    1. Pacemaker    2. Late onset Alzheimer's dementia without behavioral disturbance (HCC)    3. Chronic diastolic (congestive) heart failure (HCC)    4. Paroxysmal atrial fibrillation (HCC)  -     apixaban (Eliquis) 5 mg tablet; TAKE 1 TABLET BY MOUTH TWICE A DAY(MUST MAKE APPT W/MD FOR FURTHER FILLS)    follow up in May 2022      Pt expressed understanding with the diagnosis and plan        Discussed expected course/resolution/complications of diagnosis in detail with patient. ??  Medication risks/benefits/costs/interactions/alternatives discussed with patient. ??  Pt was given an after visit summary which includes diagnoses, current medications & vitals. ??Pt expressed understanding with the diagnosis and plan

## 2020-12-26 NOTE — Progress Notes (Signed)
 Chief Complaint   Patient presents with   . Hypertension   . Medication Refill       1. Have you been to the ER, urgent care clinic since your last visit?  Hospitalized since your last visit? No    2. Have you seen or consulted any other health care providers outside of the [facility] since your last visit? Yes When: 12/21 Where: podiatrist Reason for visit: check up    12/2020, Dr. Jiles, - echocardiogram     3. For patients over 45: Has the patient had a colonoscopy? Yes, HM satisfied with blue hyperlink     If the patient is male:    4. For patients over 40: Has the patient had a mammogram? NA based on age or sex    60. For patients over 21: Has the patient had a pap smear? NA based on age or sex    3 most recent PHQ Screens 12/26/2020   Little interest or pleasure in doing things Not at all   Feeling down, depressed, irritable, or hopeless Not at all   Total Score PHQ 2 0

## 2021-01-01 MED ORDER — FLUDROCORTISONE 0.1 MG TAB
0.1 mg | ORAL_TABLET | ORAL | 7 refills | Status: AC
Start: 2021-01-01 — End: ?

## 2021-01-01 NOTE — Telephone Encounter (Signed)
Received refill request for fludrocortisone 0.1 mg po tabs.  Last Cr & Na WNL.  Refill authorized.    Future Appointments   Date Time Provider Department Center   02/08/2021 11:00 AM Costella Hatcher B, NP [facility]   02/25/2021  1:45 PM REMOTE1, [facility]   04/16/2021  9:30 AM Laurence Spates, NP [facility]   04/26/2021  9:30 AM Laurence Spates, NP [facility] AM[facility]   05/02/2021 10:40 AM Hulan Saas, DO [facility]   06/03/2021 10:00 AM REMOTE1, [facility]   09/10/2021  3:00 PM PACEMAKER3, [facility]   09/10/2021  3:20 PM Thurston Pounds, MD [facility]

## 2021-01-14 ENCOUNTER — Encounter

## 2021-01-15 MED ORDER — LEVOTHYROXINE 50 MCG TAB
50 mcg | ORAL_TABLET | ORAL | 0 refills | Status: DC
Start: 2021-01-15 — End: 2021-02-12

## 2021-01-15 MED ORDER — ATORVASTATIN 20 MG TAB
20 mg | ORAL_TABLET | ORAL | 0 refills | Status: DC
Start: 2021-01-15 — End: 2021-02-12

## 2021-02-08 ENCOUNTER — Ambulatory Visit: Attending: Nurse Practitioner | Primary: Family

## 2021-02-08 ENCOUNTER — Ambulatory Visit: Admit: 2021-02-08 | Discharge: 2021-02-08 | Payer: MEDICARE | Attending: Nurse Practitioner | Primary: Family

## 2021-02-08 DIAGNOSIS — I472 Ventricular tachycardia, unspecified: Secondary | ICD-10-CM

## 2021-02-08 NOTE — Progress Notes (Signed)
Progress Notes by French AnaSherman, Alphonse Asbridge B, NP at 02/08/21 1100                Author: French AnaSherman, Laquentin Loudermilk B, NP  Service: --  Author Type: Nurse Practitioner       Filed: 02/08/21 1207  Encounter Date: 02/08/2021  Status: Signed          Editor: French AnaSherman, Colby Catanese B, NP (Nurse Practitioner)                         Cardiac Electrophysiology OFFICE Note          Subjective:         [name] is a 87 y.o. patient  who presents for follow up of St. Jude dual chamber pacemaker (DOI 12/22/2017).      Pacer check in 11/2020 had shown NSVT & chronic AF.      Echo in 12/2020 showed LVEF 50-55% with dilated RV, biatrial enlargement, mild MR & TR.      Lexiscan cardiolite stress test in 2019 showed no ischemia.      Denies chest pain, palpitations, PND, orthopnea, syncope, or edema. ??His caregiver does note that he  has dyspnea on exertion, may have to sit for a few minutes with walking.      BP controlled.    Anticoagulated with Eliquis, denies bleeding issues.         Previous:    2 caregivers at home 7 days a week for 4 to 6 hours a day.  Occasionally has bruises on his face or forearms and the caregiver thinks  that he may fall, but he denies it.  Sister-in-law lives 2 hours away and usually in contact with the caregivers      Feels better with LRL 70 bpm.       St. Jude dual chamber pacemaker (DOI 12/22/2017).    Frequent PVCs.     Admitted 12/19/2017 with slurred speech, dizziness,  & gait abnormalities for months, then acutely worsened & accompanied by right-sided facial droop. ??Head CT showed possible acute infarction in posterior left frontal lobe. MRI brain showed acute small vessel infarcts. New AF noted during admission.      Had been followed by Dr. [name].                  Problem List   Date Reviewed:  02/08/2021                        Codes  Class  Noted             At high risk for falls  ICD-10-CM: Z91.81   ICD-9-CM: V15.88    06/13/2020                       Tachycardia-bradycardia syndrome (HCC)   ICD-10-CM: I49.5   ICD-9-CM: 427.81    12/21/2017                       Paroxysmal atrial fibrillation (HCC)  ICD-10-CM: I48.0   ICD-9-CM: 427.31    12/21/2017                       Pacemaker  ICD-10-CM: Z95.0   ICD-9-CM: V45.01    12/21/2017          Overview Signed 12/21/2017  6:34 PM by Thurston PoundsNgo, Matthew, MD  12/22/2017 St Jude dual chamber                                   Acute CVA (cerebrovascular accident) [facility])  ICD-10-CM: I63.9   ICD-9-CM: 434.91    12/19/2017                            Current Outpatient Medications          Medication  Sig  Dispense  Refill           ?  levothyroxine (SYNTHROID) 50 mcg tablet  TAKE 1 TABLET BY MOUTH 30 MINUTES BEFORE BREAKFAST  90 Tablet  0     ?  atorvastatin (LIPITOR) 20 mg tablet  TAKE 1 TABLET BY MOUTH EVERY DAY  90 Tablet  0     ?  fludrocortisone (FLORINEF) 0.1 mg tablet  TAKE 1/2 TABLET BY MOUTH EVERY DAY  45 Tablet  7     ?  apixaban (Eliquis) 5 mg tablet  TAKE 1 TABLET BY MOUTH TWICE A DAY(MUST MAKE APPT W/MD FOR FURTHER FILLS)  60 Tablet  3     ?  memantine ER (NAMENDA XR) 28 mg capsule  TAKE 1 CAPSULE BY MOUTH EVERY DAY  90 Capsule  1     ?  diphenhydrAMINE (Benadryl Allergy) 25 mg tablet  Take 25 mg by mouth as needed for Allergies.               ?  donepeziL (ARICEPT) 10 mg tablet  TAKE 1/2 TAB BY MOUTH AT BEDTIME FOR 1 WEEK THEN INCREASE TO 1 TAB BY MOUTH AT BEDTIME (Patient taking differently: Take 10 mg by mouth nightly.)  90 Tablet  1          Allergies        Allergen  Reactions         ?  Flu Vaccine Qs2014-15(54mo,Up)  Rash             States not an allergy 11/23/19          Past Medical History:        Diagnosis  Date         ?  Arrhythmia       ?  Arthritis       ?  Atrial fibrillation (HCC)       ?  Chronic pain            RIGHT HIP         ?  Dementia (HCC)       ?  Hypercholesterolemia       ?  Insomnia       ?  Long term current use of anticoagulant therapy       ?  Pacemaker           ?  Stroke [facility])            Past Surgical History:         Procedure   Laterality  Date          ?  HX PACEMAKER              ?  PR ABDOMEN SURGERY PROC UNLISTED              Family History         Problem  Relation  Age  of Onset          ?  Cancer  Mother                BREAST CA          ?  Heart Disease  Father            ?  Heart Disease  Brother            Social History          Tobacco Use         ?  Smoking status:  Current Every Day Smoker              Types:  Cigars         ?  Smokeless tobacco:  Never Used        ?  Tobacco comment: occasional       Substance Use Topics         ?  Alcohol use:  Yes             Comment: 2/WEEK VODKA            Review of Systems: Review of all other systems otherwise negative.   Constitutional: Negative for fever, chills, weight loss, malaise/fatigue.    HEENT: Negative for nosebleeds, vision changes.    Respiratory: Negative for cough, hemoptysis.   Cardiovascular: Negative for chest pain, palpitations, orthopnea, claudication, leg swelling, syncope, and PND. + DOE   Gastrointestinal: Negative for nausea, vomiting, diarrhea, blood in stool and melena.    Genitourinary: Negative for dysuria, and hematuria.    Musculoskeletal: Negative for myalgias, arthralgia.    Skin: Negative for rash.    Heme: Does not bleed or bruise easily.    Neurological: Negative for speech change and focal weakness.         Objective:        Visit Vitals      BP  122/72 (BP 1 Location: Right arm, BP Patient Position: Sitting, BP Cuff Size: Adult)     Pulse  73     Resp  16     Ht  6\' 1"  (1.854 m)     Wt  188 lb (85.3 kg)     SpO2  98%        BMI  24.80 kg/m??            Physical Exam:    Constitutional: Well-developed and well-nourished. No respiratory  distress.    Head: Normocephalic and atraumatic.    Eyes: Pupils are equal, round.   ENT: Hearing grossly normal.   Neck: Supple. No JVD present.    Cardiovascular: Normal rate, regular rhythm. Exam reveals no gallop and no friction rub. No murmur heard.   Pulmonary/Chest: Effort normal and breath sounds normal. No  wheezes.    Abdominal: Soft, no tenderness.   Musculoskeletal: Moves extremities independently.  Normal gait.   Vasc/lymphatic: No edema.   Neurological: Alert,oriented.    Skin: Skin is warm and dry.  Left chest pacer site well healed.  Psychiatric: Normal mood and affect. Behavior is normal. Judgment and thought content normal.             Assessment/Plan:        Imaging/Studies:   Echo (12/19/2020): LVEF 50-55%,  mild concentric LVH.  RV mildly dilated.  Mod BAE.  Mild MR.  Mild sclerosis of aortic valve cusps, mild AR.  Mildly dilated ascending aorta.      Nuclear  stress (05/07/2018): LVEF 53%, negative myocardial perfusion imaging.   ??   MRI brain (12/19/2017): Punctate foci of diffusion restriction in left frontal lobe compatible with acute small vessel infarcts. No evidence of intracranial  hemorrhage or mass.     CTA head & neck (12/19/2017): No evidence large vessel occlusion. Small branch post MCA in sylvian fissure on series 2 image 429, gradually tapers to occlusion.                   ICD-10-CM  ICD-9-CM             1.  NSVT (nonsustained ventricular tachycardia) (HCC)   I47.2  427.1  NUCLEAR CARDIAC STRESS TEST     2.  Dyspnea on exertion   R06.00  786.09  NUCLEAR CARDIAC STRESS TEST     3.  Pacemaker   Z95.0  V45.01       4.  SSS (sick sinus syndrome) (HCC)   I49.5  427.81             5.  Persistent atrial fibrillation (HCC)   I48.19  427.31             St. Jude dual chamber pacemaker (12/22/2017): Device  check on 11/19/2020 showed proper lead & generator function.  Generator longevity estimated 6 yrs, 5 mos.  RA <1%, RV 97%.  Atrial burden >99% since implant.  Since 08/07/2020, 2 HVR episodes noted.  HVR in 08/2020 shows NSVT during AF x 4 seconds (13  beats) with max V rate 186 bpm.  HVR in 10/2020 shows episode x 4 seconds (20 beats) with max V rate 164 bpm.     Chronic AF: Rate is controlled.      PVCs/NSVT: Asymptomatic PVCs. ??Brief NSVT occasionally noted via pacemaker. ??LVEF 50-55% in 12/2020.   Nuclear stress test in 2019 showed no ischemia; given increased DOE recently,  will repeat Lexiscan cardiolite stress test.      Anticoagulation: Continue Eliquis 5 mg po bid. ??Age  is >80, but doesn't qualify for reduced dose based on Cr or weight. ??No significant bleeding issues.  No anemia noted on last labs in 10/2020.         Remote pacer checks q 3 months.  EP clinic follow up in 1 year.     Follow-up and Dispositions      Routing History               Future Appointments           Date  Time  Provider  Department  Center           02/15/2021   9:00 AM  NUCLEAR, REYNOLDS  [facility]  [facility]     02/25/2021   1:45 PM  REMOTE1, [facility]YN[facility]DS  [facility]  [facility]     04/16/2021   9:30 AM  Sanderford, Daleen Snook, NP  [facility]  [facility]     04/26/2021   9:30 AM  Laurence Spates, NP  [facility]  [facility]     05/02/2021  10:40 AM  Hulan Saas, DO  [facility]  [facility]     06/03/2021  10:00 AM  REMOTE1, REYNOLDS  [facility]  [facility]     09/10/2021   3:00 PM  PACEMAKER3, REYNOLDS  [facility]  [facility]           09/10/2021   3:20 PM  Thurston Pounds, MD  [facility]  [facility]  Thank you for involving me in this patient's care and please call with further concerns or questions.      Costella Hatcher, FNP-C   [facility]   02/08/21

## 2021-02-11 ENCOUNTER — Encounter

## 2021-02-12 MED ORDER — ATORVASTATIN 20 MG TAB
20 mg | ORAL_TABLET | ORAL | 0 refills | Status: DC
Start: 2021-02-12 — End: 2021-06-25

## 2021-02-12 MED ORDER — LEVOTHYROXINE 50 MCG TAB
50 mcg | ORAL_TABLET | ORAL | 0 refills | Status: DC
Start: 2021-02-12 — End: 2021-06-25

## 2021-02-13 MED ORDER — DONEPEZIL 10 MG TAB
10 mg | ORAL_TABLET | Freq: Every evening | ORAL | 1 refills | Status: DC
Start: 2021-02-13 — End: 2021-06-26

## 2021-02-15 ENCOUNTER — Ambulatory Visit

## 2021-02-15 ENCOUNTER — Encounter: Primary: Family

## 2021-02-15 ENCOUNTER — Ambulatory Visit: Admit: 2021-02-15 | Discharge: 2021-02-15 | Payer: MEDICARE | Primary: Family

## 2021-02-15 DIAGNOSIS — I472 Ventricular tachycardia: Secondary | ICD-10-CM

## 2021-02-15 MED ORDER — TECHNETIUM TC-99M SESTAMIBI (CARDIOLITE) INJECTION WITH DILUTION KIT
Freq: Once | INTRAVENOUS | Status: AC
Start: 2021-02-15 — End: 2021-02-15
  Administered 2021-02-15: 14:00:00 via INTRAVENOUS

## 2021-02-15 MED ORDER — TECHNETIUM TC-99M SESTAMIBI (CARDIOLITE) INJECTION WITH DILUTION KIT
Freq: Once | INTRAVENOUS | Status: AC
Start: 2021-02-15 — End: 2021-02-15
  Administered 2021-02-15: 16:00:00 via INTRAVENOUS

## 2021-02-15 MED ORDER — REGADENOSON 0.4 MG/5 ML IV SYRINGE
0.4 mg/5 mL | Freq: Once | INTRAVENOUS | Status: AC
Start: 2021-02-15 — End: 2021-02-15
  Administered 2021-02-15: 16:00:00 via INTRAVENOUS

## 2021-02-18 LAB — NM STRESS TEST WITH MYOCARDIAL PERFUSION
Baseline Diastolic BP: 82 mmHg
Baseline HR: 70 {beats}/min
Baseline O2 Sat: 99 %
Baseline ST Depression: 0 mm
Baseline Systolic BP: 138 mmHg
Nuc Rest EF: 54 %
Nuc Stress EF: 54 %
Stress Diastolic BP: 70 mmHg
Stress O2 Sat: 100 %
Stress Peak HR: 75 {beats}/min
Stress Percent HR Achieved: 56 %
Stress Rate Pressure Product: 9150 BPM*mmHg
Stress ST Depression: 0 mm
Stress Systolic BP: 122 mmHg
Stress Target HR: 134 {beats}/min
TID: 1.04

## 2021-02-18 LAB — NUCLEAR CARDIAC STRESS TEST
Baseline Diastolic BP: 82 mmHg
Baseline HR: 70 {beats}/min
Baseline O2 Sat: 99 %
Baseline ST Depression: 0 mm
Baseline Systolic BP: 138 mmHg
Nuc Rest EF: 54 %
Nuc Stress EF: 54 %
Stress Diastolic BP: 70 mmHg
Stress O2 Sat: 100 %
Stress Peak HR: 75 {beats}/min
Stress Percent HR Achieved: 56 %
Stress Rate Pressure Product: 9150 BPM*mmHg
Stress ST Depression: 0 mm
Stress Systolic BP: 122 mmHg
Stress Target HR: 134 {beats}/min
TID: 1.04

## 2021-02-18 NOTE — Telephone Encounter (Signed)
Verified patient with two types of identifiers. Spoke with [place], verified on PHI. Notified of results and MD recommendations. Patient verbalized understanding and will call with any other questions.      Future Appointments   Date Time Provider Department Center   02/25/2021  1:45 PM Lorel Monaco [facility]   04/16/2021  9:30 AM Laurence Spates, NP [facility]   04/26/2021  9:30 AM Laurence Spates, NP [facility]   05/02/2021 10:40 AM Hulan Saas, DO [facility]   06/03/2021 10:00 AM REMOTE1, [facility]   09/10/2021  3:00 PM PACEMAKER3, RE[facility]DS [facility]   09/10/2021  3:20 PM Thurston Pounds, MD [facility]

## 2021-02-18 NOTE — Telephone Encounter (Signed)
-----   Message from Thurston Pounds, MD sent at 02/18/2021  7:45 AM EST -----  Nuc stress test normal

## 2021-02-25 ENCOUNTER — Ambulatory Visit: Admit: 2021-02-26 | Payer: MEDICARE | Primary: Family

## 2021-02-25 DIAGNOSIS — Z950 Presence of cardiac pacemaker: Secondary | ICD-10-CM

## 2021-02-25 NOTE — Progress Notes (Signed)
Progress  Notes by Jonna Munro, Huntley Dec at 02/25/21 1345                Author: Jonna Munro, Huntley Dec  Service: --  Chartered loss adjuster Type: Technician       Filed: 02/26/21 0926  Encounter Date: 02/25/2021  Status: Signed          Editor: Jonna Munro, Huntley Dec (Technician)               See scanned Pacemaker Report in Chart Review.   Chargeable visit.

## 2021-02-26 ENCOUNTER — Ambulatory Visit: Primary: Family

## 2021-02-26 DIAGNOSIS — Z950 Presence of cardiac pacemaker: Secondary | ICD-10-CM

## 2021-03-25 MED ORDER — MEMANTINE 28 MG SPRINKLE ER 24HR CAPSULE
28 mg | ORAL_CAPSULE | ORAL | 0 refills | Status: DC
Start: 2021-03-25 — End: 2021-06-26

## 2021-04-02 NOTE — Telephone Encounter (Signed)
Writer left generic message to have pt callback the office,pt need's to re-schedule his 04/26/21 appt due to the provider being out of the office.Please re-schedule accordingly.

## 2021-04-16 ENCOUNTER — Encounter: Attending: Family | Primary: Family

## 2021-04-19 NOTE — Telephone Encounter (Signed)
Received alert for HVR on 04/18/2021 for NSVT during AF at 9:28 AM lasting 4 seconds (13 beats) with max V rate of 162 bpm.    He has had similar noted previously.       Lexiscan cardiolite stress (02/15/2021): Normal.    Echo (12/19/2020): LVEF 50-55%, mild concentric LVH.  RV mildly dilated.  Mod BAE.  Mild MR.  Mild sclerosis of aortic valve cusps, mild AR.  Mildly dilated ascending aorta.  ??    No change in treatment plan based on findings.

## 2021-04-26 ENCOUNTER — Encounter: Attending: Family | Primary: Family

## 2021-05-01 ENCOUNTER — Ambulatory Visit: Attending: Family | Primary: Family

## 2021-05-01 ENCOUNTER — Ambulatory Visit: Admit: 2021-05-01 | Discharge: 2021-05-01 | Payer: MEDICARE | Attending: Family | Primary: Family

## 2021-05-01 ENCOUNTER — Encounter: Payer: MEDICARE | Attending: Family | Primary: Family

## 2021-05-01 DIAGNOSIS — E781 Pure hyperglyceridemia: Secondary | ICD-10-CM

## 2021-05-01 LAB — COMPREHENSIVE METABOLIC PANEL
ALT: 15 U/L (ref 12–78)
AST: 16 U/L (ref 15–37)
Albumin/Globulin Ratio: 1.3 (ref 1.1–2.2)
Albumin: 3.7 g/dL (ref 3.5–5.0)
Alkaline Phosphatase: 80 U/L (ref 45–117)
Anion Gap: 2 mmol/L — ABNORMAL LOW (ref 5–15)
BUN: 22 MG/DL — ABNORMAL HIGH (ref 6–20)
Bun/Cre Ratio: 17 (ref 12–20)
CO2: 30 mmol/L (ref 21–32)
Calcium: 9.4 MG/DL (ref 8.5–10.1)
Chloride: 111 mmol/L — ABNORMAL HIGH (ref 97–108)
Creatinine: 1.32 MG/DL — ABNORMAL HIGH (ref 0.70–1.30)
EGFR IF NonAfrican American: 51 mL/min/{1.73_m2} — ABNORMAL LOW (ref >60)
GFR African American: >60 mL/min/{1.73_m2} (ref >60)
Globulin: 2.9 g/dL (ref 2.0–4.0)
Glucose: 118 mg/dL — ABNORMAL HIGH (ref 65–100)
Potassium: 4.2 mmol/L (ref 3.5–5.1)
Sodium: 143 mmol/L (ref 136–145)
Total Bilirubin: 0.9 MG/DL (ref 0.2–1.0)
Total Protein: 6.6 g/dL (ref 6.4–8.2)

## 2021-05-01 LAB — LIPID PANEL
CHOL/HDL Ratio: 2.4 (ref 0.0–5.0)
Chol/HDL Ratio: 2.4 (ref 0.0–5.0)
Cholesterol, Total: 116 MG/DL (ref <200)
Cholesterol, total: 116 MG/DL (ref <200)
HDL Cholesterol: 49 MG/DL
HDL: 49 MG/DL
LDL Calculated: 51.8 MG/DL (ref 0–100)
LDL, calculated: 51.8 MG/DL (ref 0–100)
Triglyceride: 76 MG/DL (ref <150)
Triglycerides: 76 MG/DL (ref <150)
VLDL Cholesterol Calculated: 15.2 MG/DL
VLDL, calculated: 15.2 MG/DL

## 2021-05-01 LAB — CBC
Hematocrit: 44.8 % (ref 36.6–50.3)
Hemoglobin: 14.7 g/dL (ref 12.1–17.0)
MCH: 33.3 PG (ref 26.0–34.0)
MCHC: 32.8 g/dL (ref 30.0–36.5)
MCV: 101.6 FL — ABNORMAL HIGH (ref 80.0–99.0)
MPV: 10.9 FL (ref 8.9–12.9)
NRBC Absolute: 0 10*3/uL (ref 0.00–0.01)
Nucleated RBCs: 0 PER 100 WBC
Platelets: 101 10*3/uL — ABNORMAL LOW (ref 150–400)
RBC: 4.41 M/uL (ref 4.10–5.70)
RDW: 13.1 % (ref 11.5–14.5)
WBC: 5.2 10*3/uL (ref 4.1–11.1)

## 2021-05-01 LAB — T4, FREE
T4 Free: 1 NG/DL (ref 0.8–1.5)
T4, Free: 1 NG/DL (ref 0.8–1.5)

## 2021-05-01 LAB — TSH 3RD GENERATION
TSH: 2.79 u[IU]/mL (ref 0.36–3.74)
TSH: 2.79 u[IU]/mL (ref 0.36–3.74)

## 2021-05-01 LAB — CBC W/O DIFF
ABSOLUTE NRBC: 0 10*3/uL (ref 0.00–0.01)
HCT: 44.8 % (ref 36.6–50.3)
HGB: 14.7 g/dL (ref 12.1–17.0)
MCH: 33.3 PG (ref 26.0–34.0)
MCHC: 32.8 g/dL (ref 30.0–36.5)
MCV: 101.6 FL — ABNORMAL HIGH (ref 80.0–99.0)
MPV: 10.9 FL (ref 8.9–12.9)
NRBC: 0 PER 100 WBC
PLATELET: 101 10*3/uL — ABNORMAL LOW (ref 150–400)
RBC: 4.41 M/uL (ref 4.10–5.70)
RDW: 13.1 % (ref 11.5–14.5)
WBC: 5.2 10*3/uL (ref 4.1–11.1)

## 2021-05-01 LAB — METABOLIC PANEL, COMPREHENSIVE
A-G Ratio: 1.3 (ref 1.1–2.2)
ALT (SGPT): 15 U/L (ref 12–78)
AST (SGOT): 16 U/L (ref 15–37)
Albumin: 3.7 g/dL (ref 3.5–5.0)
Alk. phosphatase: 80 U/L (ref 45–117)
Anion gap: 2 mmol/L — ABNORMAL LOW (ref 5–15)
BUN/Creatinine ratio: 17 (ref 12–20)
BUN: 22 MG/DL — ABNORMAL HIGH (ref 6–20)
Bilirubin, total: 0.9 MG/DL (ref 0.2–1.0)
CO2: 30 mmol/L (ref 21–32)
Calcium: 9.4 MG/DL (ref 8.5–10.1)
Chloride: 111 mmol/L — ABNORMAL HIGH (ref 97–108)
Creatinine: 1.32 MG/DL — ABNORMAL HIGH (ref 0.70–1.30)
GFR est AA: >60 mL/min/{1.73_m2} (ref >60)
GFR est non-AA: 51 mL/min/{1.73_m2} — ABNORMAL LOW (ref >60)
Globulin: 2.9 g/dL (ref 2.0–4.0)
Glucose: 118 mg/dL — ABNORMAL HIGH (ref 65–100)
Potassium: 4.2 mmol/L (ref 3.5–5.1)
Protein, total: 6.6 g/dL (ref 6.4–8.2)
Sodium: 143 mmol/L (ref 136–145)

## 2021-05-01 NOTE — Progress Notes (Signed)
Chief Complaint   Patient presents with   . Labs   . Thyroid Problem     1. Have you been to the ER, urgent care clinic since your last visit?  Hospitalized since your last visit?No    2. Have you seen or consulted any other health care providers outside of the [facility] since your last visit?  Include any pap smears or colon screening. No

## 2021-05-01 NOTE — Progress Notes (Signed)
[facility] Note  Subjective:      [name] is a 87 y.o. male who presents for follow up on chronic problems, he has history of hyperlipidemia, hypothyroidism, chronic kidney disease, pacemaker, and dementia-- he is followed by a neurologist. Today he is accompanied by his care taker and is not fasting.   Past Medical History:   Diagnosis Date   ??? Arrhythmia    ??? Arthritis    ??? Atrial fibrillation (HCC)    ??? Chronic pain     RIGHT HIP   ??? Dementia (HCC)    ??? Hypercholesterolemia    ??? Insomnia    ??? Long term current use of anticoagulant therapy    ??? Pacemaker    ??? Stroke [facility])      Past Surgical History:   Procedure Laterality Date   ??? HX PACEMAKER     ??? PR ABDOMEN SURGERY PROC UNLISTED         Current Outpatient Medications   Medication Sig Dispense Refill   ??? memantine ER (NAMENDA XR) 28 mg capsule TAKE 1 CAPSULE BY MOUTH EVERY DAY 90 Capsule 0   ??? donepeziL (ARICEPT) 10 mg tablet Take 1 Tablet by mouth nightly. 90 Tablet 1   ??? atorvastatin (LIPITOR) 20 mg tablet TAKE 1 TABLET BY MOUTH EVERY DAY 90 Tablet 0   ??? levothyroxine (SYNTHROID) 50 mcg tablet TAKE 1 TABLET BY MOUTH 30 MINUTES BEFORE BREAKFAST 90 Tablet 0   ??? apixaban (Eliquis) 5 mg tablet TAKE 1 TABLET BY MOUTH TWICE A DAY(MUST MAKE APPT W/MD FOR FURTHER FILLS) 60 Tablet 3   ??? diphenhydrAMINE (Benadryl Allergy) 25 mg tablet Take 25 mg by mouth as needed for Allergies.     ??? fludrocortisone (FLORINEF) 0.1 mg tablet TAKE 1/2 TABLET BY MOUTH EVERY DAY (Patient not taking: Reported on 05/01/2021) 45 Tablet 7     Allergies   Allergen Reactions   ??? Flu Vaccine Qs2014-15(69mo,Up) Rash     States not an allergy 11/23/19       ROS:   Complete review of systems was reviewed with pertinent information listed in HPI.  Review of Systems   Constitutional: Negative.    HENT: Negative.    Respiratory: Negative.    Cardiovascular: Negative.    Gastrointestinal: Negative.    Genitourinary: Negative.    Musculoskeletal: Negative.         Objective:     Visit Vitals  BP 107/63 (BP 1 Location: Right arm, BP Patient Position: Sitting, BP Cuff Size: Adult)   Pulse 71   Temp 97.4 ??F (36.3 ??C) (Temporal)   Resp 18   Ht 6\' 1"  (1.854 m)   Wt 192 lb (87.1 kg)   SpO2 98%   BMI 25.33 kg/m??       Vitals and Nurse Documentation reviewed.     Physical Exam  Constitutional:       Appearance: Normal appearance.   HENT:      Mouth/Throat:      Mouth: Mucous membranes are moist.   Cardiovascular:      Rate and Rhythm: Normal rate and regular rhythm.      Pulses: Normal pulses.      Heart sounds: Normal heart sounds. No murmur heard.      Pulmonary:      Effort: Pulmonary effort is normal.      Breath sounds: Normal breath sounds.   Abdominal:      General: Bowel sounds are normal.  Palpations: Abdomen is soft.   Musculoskeletal:      Cervical back: Normal range of motion and neck supple.   Neurological:      Mental Status: He is alert.   Psychiatric:         Mood and Affect: Mood normal.         Behavior: Behavior normal.         Assessment/Plan:     Diagnoses and all orders for this visit:    1. Pure hyperglyceridemia  -     LIPID PANEL; Future  -     METABOLIC PANEL, COMPREHENSIVE; Future    2. Acquired hypothyroidism  -     TSH 3RD GENERATION; Future  -     T4, FREE; Futur    3. Stage 3 chronic kidney disease, unspecified whether stage 3a or 3b CKD (HCC)  -     CBC W/O DIFF; Future  Await labs  Follow up in November          Pt expressed understanding with the diagnosis and plan        Discussed expected course/resolution/complications of diagnosis in detail with patient. ??  Medication risks/benefits/costs/interactions/alternatives discussed with patient. ??  Pt was given an after visit summary which includes diagnoses, current medications & vitals. ??Pt expressed understanding with the diagnosis and plan

## 2021-05-02 ENCOUNTER — Encounter: Attending: Neurology | Primary: Family

## 2021-05-07 ENCOUNTER — Encounter

## 2021-05-07 MED ORDER — APIXABAN 5 MG TABLET
5 mg | ORAL_TABLET | ORAL | 3 refills | Status: DC
Start: 2021-05-07 — End: 2021-09-17

## 2021-05-16 ENCOUNTER — Encounter: Attending: Neurology | Primary: Family

## 2021-05-28 ENCOUNTER — Ambulatory Visit: Attending: Family | Primary: Family

## 2021-05-28 ENCOUNTER — Ambulatory Visit: Admit: 2021-05-28 | Payer: MEDICARE | Attending: Family | Primary: Family

## 2021-05-28 DIAGNOSIS — S70362A Insect bite (nonvenomous), left thigh, initial encounter: Secondary | ICD-10-CM

## 2021-05-28 DIAGNOSIS — N183 Chronic kidney disease, stage 3 unspecified: Secondary | ICD-10-CM

## 2021-05-28 MED ORDER — DOXYCYCLINE 100 MG TAB
100 mg | ORAL_TABLET | Freq: Two times a day (BID) | ORAL | 0 refills | Status: AC
Start: 2021-05-28 — End: 2021-06-07

## 2021-05-28 NOTE — Progress Notes (Signed)
 Chief Complaint   Patient presents with   . Insect Bite     on left thigh and right leg     1. Have you been to the ER, urgent care clinic since your last visit?  Hospitalized since your last visit?No    2. Have you seen or consulted any other health care providers outside of the [facility] since your last visit?  Include any pap smears or colon screening. No

## 2021-05-28 NOTE — Progress Notes (Signed)
[facility] Note  Subjective:      TORIAN QUINTERO is a 87 y.o. male who presents for inset bite on his left thigh. His right lower legs is red due to dry skin and scratching. He is accompanied by his care taker.  `  Past Medical History:   Diagnosis Date   ??? Arrhythmia    ??? Arthritis    ??? Atrial fibrillation (HCC)    ??? Chronic pain     RIGHT HIP   ??? Dementia (HCC)    ??? Hypercholesterolemia    ??? Insomnia    ??? Long term current use of anticoagulant therapy    ??? Pacemaker    ??? Stroke [facility])      Past Surgical History:   Procedure Laterality Date   ??? HX PACEMAKER     ??? PR ABDOMEN SURGERY PROC UNLISTED         Current Outpatient Medications   Medication Sig Dispense Refill   ??? ammonium lactate (AMLACTIN) 12 % topical cream APPLY 1 APPLICATION ONCE A DAY FOR 30 DAY(S) TO BOTH FEET     ??? doxycycline (ADOXA) 100 mg tablet Take 1 Tablet by mouth two (2) times a day for 10 days. 20 Tablet 0   ??? apixaban (Eliquis) 5 mg tablet TAKE 1 TABLET BY MOUTH TWICE A DAY(MUST MAKE APPT W/MD FOR FURTHER FILLS) 60 Tablet 3   ??? memantine ER (NAMENDA XR) 28 mg capsule TAKE 1 CAPSULE BY MOUTH EVERY DAY 90 Capsule 0   ??? donepeziL (ARICEPT) 10 mg tablet Take 1 Tablet by mouth nightly. 90 Tablet 1   ??? atorvastatin (LIPITOR) 20 mg tablet TAKE 1 TABLET BY MOUTH EVERY DAY 90 Tablet 0   ??? levothyroxine (SYNTHROID) 50 mcg tablet TAKE 1 TABLET BY MOUTH 30 MINUTES BEFORE BREAKFAST 90 Tablet 0   ??? diphenhydrAMINE (Benadryl Allergy) 25 mg tablet Take 25 mg by mouth as needed for Allergies.     ??? fludrocortisone (FLORINEF) 0.1 mg tablet TAKE 1/2 TABLET BY MOUTH EVERY DAY (Patient not taking: Reported on 05/01/2021) 45 Tablet 7     Allergies   Allergen Reactions   ??? Flu Vaccine Qs2014-15(20mo,Up) Rash     States not an allergy 11/23/19       ROS:   Complete review of systems was reviewed with pertinent information listed in HPI.  Review of Systems   Constitutional: Negative.    HENT: Negative.    Respiratory: Negative.     Cardiovascular: Negative.    Gastrointestinal: Negative.    Genitourinary: Negative.    Musculoskeletal: Negative.        Objective:     Visit Vitals  BP 130/68 (BP 1 Location: Left arm, BP Patient Position: Sitting, BP Cuff Size: Adult)   Pulse 76   Temp 97.3 ??F (36.3 ??C) (Temporal)   Resp 18   Ht 6\' 1"  (1.854 m)   Wt 191 lb 6.4 oz (86.8 kg)   SpO2 97%   BMI 25.25 kg/m??       Vitals and Nurse Documentation reviewed.     Physical Exam  Constitutional:       Appearance: Normal appearance.   HENT:      Mouth/Throat:      Mouth: Mucous membranes are moist.   Cardiovascular:      Rate and Rhythm: Normal rate and regular rhythm.      Pulses: Normal pulses.      Heart sounds: Normal heart sounds. No  murmur heard.      Pulmonary:      Effort: Pulmonary effort is normal.      Breath sounds: Normal breath sounds.   Abdominal:      General: Bowel sounds are normal.      Palpations: Abdomen is soft.   Musculoskeletal:      Cervical back: Normal range of motion and neck supple.   Skin:     Findings: Erythema present.      Comments: Erythematous area with injection site in left thigh  Right lower leg redness due to dry skin   Neurological:      Mental Status: He is alert.         Assessment/Plan:     Diagnoses and all orders for this visit:    1. Insect bite of left thigh, initial encounter  -     doxycycline (ADOXA) 100 mg tablet; Take 1 Tablet by mouth two (2) times a day for 10 days.        -     Advised to apply calamine lotion on dry skin    2. Stage 3 chronic kidney disease, unspecified whether stage 3a or 3b CKD (HCC)          Pt expressed understanding with the diagnosis and plan        Discussed expected course/resolution/complications of diagnosis in detail with patient. ??  Medication risks/benefits/costs/interactions/alternatives discussed with patient. ??  Pt was given an after visit summary which includes diagnoses, current medications & vitals. ??Pt expressed understanding with the diagnosis and plan

## 2021-06-03 ENCOUNTER — Ambulatory Visit: Admit: 2021-06-05 | Discharge: 2021-06-05 | Payer: MEDICARE | Primary: Family

## 2021-06-03 DIAGNOSIS — Z950 Presence of cardiac pacemaker: Secondary | ICD-10-CM

## 2021-06-03 NOTE — Progress Notes (Signed)
See scanned Pacemaker Report in Chart Review.  Chargeable visit.

## 2021-06-04 ENCOUNTER — Encounter: Attending: Neurology | Primary: Family

## 2021-06-05 ENCOUNTER — Ambulatory Visit: Primary: Family

## 2021-06-05 DIAGNOSIS — Z950 Presence of cardiac pacemaker: Secondary | ICD-10-CM

## 2021-06-24 ENCOUNTER — Encounter

## 2021-06-25 MED ORDER — LEVOTHYROXINE 50 MCG TAB
50 mcg | ORAL_TABLET | ORAL | 0 refills | Status: DC
Start: 2021-06-25 — End: 2021-09-17

## 2021-06-25 MED ORDER — ATORVASTATIN 20 MG TAB
20 mg | ORAL_TABLET | ORAL | 0 refills | Status: AC
Start: 2021-06-25 — End: ?

## 2021-06-26 ENCOUNTER — Ambulatory Visit: Attending: Neurology | Primary: Family

## 2021-06-26 ENCOUNTER — Ambulatory Visit: Admit: 2021-06-26 | Payer: MEDICARE | Attending: Neurology | Primary: Family

## 2021-06-26 DIAGNOSIS — G301 Alzheimer's disease with late onset: Secondary | ICD-10-CM

## 2021-06-26 DIAGNOSIS — F0280 Dementia in other diseases classified elsewhere without behavioral disturbance: Secondary | ICD-10-CM

## 2021-06-26 MED ORDER — MEMANTINE 28 MG SPRINKLE ER 24HR CAPSULE
28 mg | ORAL_CAPSULE | Freq: Every day | ORAL | 1 refills | Status: AC
Start: 2021-06-26 — End: ?

## 2021-06-26 MED ORDER — DONEPEZIL 10 MG TAB
10 mg | ORAL_TABLET | Freq: Every evening | ORAL | 1 refills | Status: AC
Start: 2021-06-26 — End: ?

## 2021-06-26 NOTE — Progress Notes (Signed)
 Chief Complaint   Patient presents with   . Follow-up     Memory loss     Visit Vitals  BP 110/74 (BP 1 Location: Left upper arm, BP Patient Position: Sitting)   Pulse 97   Resp 16   Ht 6' 1 (1.854 m)   Wt 178 lb (80.7 kg)   SpO2 98%   BMI 23.48 kg/m

## 2021-06-26 NOTE — Progress Notes (Signed)
Neurology Clinic Follow up Note    Patient ID:  [name]  [id number]  87 y.o.  07-12-1932      Mr. Lemming is here for follow up today of stroke       Last Appointment With Me:  11/01/2020      Interval History:   Patient is here for f/u of stroke.  Here for f/u of L MCA small territory infarct, L M2/3 occlusion not amenable to endovascular intervention. ??Mechanism likely cardioembolic in setting of Afib. S/P PPM last hospitalization.    No new deficits/focal weakness/numbness/vision loss.  He remains on Eliquis for Afib/stroke prevention.  No reported side effects/hemorrhage.    He is here with his nephew/POA today. Pt/family deny any significant change in memory since his last visit.   He is receiving appropriate assistance with medications/finances.   Denies falls but reports some imbalance. Not presently using an assist device.   He appears to be tolerating Aricept and Namenda well without side effects.   He has caregivers daily for ~8 hours.    He is not driving.    No reported behavioral concerns.     PMHx/ PSHx/ FHx/ SHx:  Reviewed and unchanged previous visit.   Past Medical History:   Diagnosis Date   ??? Arrhythmia    ??? Arthritis    ??? Atrial fibrillation ([place])    ??? Chronic pain     RIGHT HIP   ??? Dementia ([place])    ??? Hypercholesterolemia    ??? Insomnia    ??? Long term current use of anticoagulant therapy    ??? Pacemaker    ??? Stroke ([place])          ROS:  Comprehensive review of systems negative except for as noted above.       Objective:       Meds:  Current Outpatient Medications   Medication Sig Dispense Refill   ??? atorvastatin (LIPITOR) 20 mg tablet TAKE 1 TABLET BY MOUTH EVERY DAY 90 Tablet 0   ??? levothyroxine (SYNTHROID) 50 mcg tablet TAKE 1 TABLET BY MOUTH 30 MINUTES BEFORE BREAKFAST 90 Tablet 0   ??? ammonium lactate (AMLACTIN) 12 % topical cream APPLY 1 APPLICATION ONCE A DAY FOR 30 DAY(S) TO BOTH FEET     ??? apixaban (Eliquis) 5 mg tablet TAKE 1 TABLET BY MOUTH TWICE A DAY(MUST MAKE APPT W/MD FOR FURTHER  FILLS) 60 Tablet 3   ??? memantine ER (NAMENDA XR) 28 mg capsule TAKE 1 CAPSULE BY MOUTH EVERY DAY 90 Capsule 0   ??? donepeziL (ARICEPT) 10 mg tablet Take 1 Tablet by mouth nightly. 90 Tablet 1   ??? fludrocortisone (FLORINEF) 0.1 mg tablet TAKE 1/2 TABLET BY MOUTH EVERY DAY (Patient not taking: Reported on 05/01/2021) 45 Tablet 7   ??? diphenhydrAMINE (Benadryl Allergy) 25 mg tablet Take 25 mg by mouth as needed for Allergies.         Exam:  Visit Vitals  BP 110/74 (BP 1 Location: Left upper arm, BP Patient Position: Sitting)   Pulse 97   Resp 16   Ht '6\' 1"'  (1.854 m)   Wt 178 lb (80.7 kg)   SpO2 98%   BMI 23.48 kg/m??      NEUROLOGICAL EXAM:  General: Awake, alert, speech fluent. Oriented to self, place, year not month/date. Serial 7's intact. Delayed recall impaired.   CN: PERRL, EOMI without nystagmus, VFF to confrontation, facial sensation and strength are normal and symmetric, hearing is intact to finger  rub bilaterally, palate and tongue movements are intact and symmetric.  Motor: Normal tone, bulk and strength bilaterally.  Reflexes: Deferred  Coordination: FNF, RAM, HTS intact.  Sensation: LT intact throughout.  Gait: Slightly unsteady    LABS  Results for orders placed or performed in visit on 05/01/21   CBC W/O DIFF   Result Value Ref Range    WBC 5.2 4.1 - 11.1 K/uL    RBC 4.41 4.10 - 5.70 M/uL    HGB 14.7 12.1 - 17.0 g/dL    HCT 44.8 36.6 - 50.3 %    MCV 101.6 (H) 80.0 - 99.0 FL    MCH 33.3 26.0 - 34.0 PG    MCHC 32.8 30.0 - 36.5 g/dL    RDW 13.1 11.5 - 14.5 %    PLATELET 101 (L) 150 - 400 K/uL    MPV 10.9 8.9 - 12.9 FL    NRBC 0.0 0 PER 100 WBC    ABSOLUTE NRBC 0.00 0.00 - 0.01 K/uL   T4, FREE   Result Value Ref Range    T4, Free 1.0 0.8 - 1.5 NG/DL   TSH 3RD GENERATION   Result Value Ref Range    TSH 2.79 0.36 - 3.81 uIU/mL   METABOLIC PANEL, COMPREHENSIVE   Result Value Ref Range    Sodium 143 136 - 145 mmol/L    Potassium 4.2 3.5 - 5.1 mmol/L    Chloride 111 (H) 97 - 108 mmol/L    CO2 30 21 - 32 mmol/L     Anion gap 2 (L) 5 - 15 mmol/L    Glucose 118 (H) 65 - 100 mg/dL    BUN 22 (H) 6 - 20 MG/DL    Creatinine 1.32 (H) 0.70 - 1.30 MG/DL    BUN/Creatinine ratio 17 12 - 20      GFR est AA >60 >60 ml/min/1.50m    GFR est non-AA 51 (L) >60 ml/min/1.760m   Calcium 9.4 8.5 - 10.1 MG/DL    Bilirubin, total 0.9 0.2 - 1.0 MG/DL    ALT (SGPT) 15 12 - 78 U/L    AST (SGOT) 16 15 - 37 U/L    Alk. phosphatase 80 45 - 117 U/L    Protein, total 6.6 6.4 - 8.2 g/dL    Albumin 3.7 3.5 - 5.0 g/dL    Globulin 2.9 2.0 - 4.0 g/dL    A-G Ratio 1.3 1.1 - 2.2     LIPID PANEL   Result Value Ref Range    Cholesterol, total 116 <200 MG/DL    Triglyceride 76 <150 MG/DL    HDL Cholesterol 49 MG/DL    LDL, calculated 51.8 0 - 100 MG/DL    VLDL, calculated 15.2 MG/DL    CHOL/HDL Ratio 2.4 0.0 - 5.0         IMAGING:  MRI Results (most recent):  Results from Hospital Encounter encounter on 12/19/17   MRI BRAIN WO CONT    Narrative *PRELIMINARY REPORT*    Punctate foci of diffusion restriction in the left frontal lobe are compatible  with acute small vessel infarcts. No evidence of intracranial hemorrhage or  mass. Mild periventricular and subcortical white matter disease, compatible with  chronic small vessel ischemia.    Preliminary report was provided by Dr. VaSharol Rousselthe on-call radiologist, at 1:30  PM.    Final report to follow.    *END PRELIMINARY REPORT*    EXAM:  MRI BRAIN WO CONT  History: Dysarthria  INDICATION:  Dysarthria    COMPARISON: CTA 12/19/2017.    CONTRAST:  None.    TECHNIQUE: Sagittal T1, axial FLAIR, T2,T1 and gradient echo images as well as  coronal T2 weighted images and axial diffusion weighted images of the head were  obtained.    FINDINGS:      Small less than 5 mm foci of cortically based infarction in the left frontal  lobe. Anterior division MCA vascular territory. No other foci of infarction. No  Chiari or syrinx. Pituitary infundibulum unremarkable. Scattered foci of  increased T2 signal intensity corona radiata and  centrum semiovale. Small remote  lacunar infarctions in the cerebral white matter on the right and on the left.  Mucoperiosteal thickening in the left maxillary sinus. Left frontal sinus  disease. Sphenoid sinus disease as well. Trace fluid in the vascular cells.  There is no evidence of mass, hemorrhage or abnormal extra-axial fluid  collection.  Normal appearing flow-voids are present in the vertebral, basilar  and carotid artery systems. The craniocervical junction is normal.     Impression IMPRESSION:     Small acute cortical infarction left MCA M2 anterior division segment.  Mild paranasal sinus disease. Greater on the left.  Mild chronic microvascular ischemic change with additional scattered remote  lacunar infarctions in the cerebral white matter. Mild cerebral atrophy.  Correlates with CTA findings of occluded small left MCA territory segmental  branch.                 Assessment:     Encounter Diagnoses     ICD-10-CM ICD-9-CM   1. Late onset Alzheimer's dementia without behavioral disturbance (HCC)  G30.1 331.0    F02.80 294.10   2. Cerebrovascular accident (CVA), unspecified mechanism ([place])  I63.9 434.91   3. Paroxysmal atrial fibrillation (HCC)  I48.0 32.49      87 year old RHM here for f/u of L MCA embolic appearing small territory infarct, L M2/3 occlusion not amenable to endovascular intervention in the setting of atrial fibrillation diagnosed on prior admission.  He is doing well post discharge from a stroke perspective without residual focal deficits.  Remains on NOAC/Eliquis and statin therapy.    Patient/family do not report significant decline in memory since last visit. Prior [facility] 17/30 with more prominent executive dysfunction, disorientation. We discussed need for assistance with finances and executive decisions due to evidence of moderate dementia on examination. Suspect Alzheimer's dementia subtype. Will continue Aricept and Namenda as prescribed to assist with memory deficits, which he  appears to be tolerating well. Continue home health assistance daily. He was advised to abstain from driving following previous OT driving evaluation. Lastly, discussed use of an assist device at all times to prevent falls.   Plan:   Continue Aricept 61m qhs, Namenda XR 227mdaily  Cont. Eliquis for stroke prevention  Cont. Statin therapy  Goal SBP<140, LDL<70   Recommend caregivers daily to assist with medication administration  Recommend assistance with finances/executive decisions   Discussed use of a cane to assist with ambulation       Follow-up and Dispositions    ?? Return in about 6 months (around 12/27/2021).       Signed:  RaOrson ApeDO  06/26/2021

## 2021-06-28 ENCOUNTER — Emergency Department: Admit: 2021-06-29 | Payer: MEDICARE | Primary: Family

## 2021-06-28 ENCOUNTER — Inpatient Hospital Stay
Admit: 2021-06-28 | Discharge: 2021-06-29 | Disposition: A | Discharge status: Skilled Nursing Facility | Payer: MEDICARE | Attending: Emergency Medicine

## 2021-06-28 ENCOUNTER — Emergency Department: Payer: MEDICARE | Primary: Family

## 2021-06-28 DIAGNOSIS — S80212A Abrasion, left knee, initial encounter: Secondary | ICD-10-CM

## 2021-06-28 NOTE — ED Notes (Signed)
 Pt arrives via ems from nephews house. EMS reports the nephew reports bilateral leg weakness and increase falls. BS 129 and all other VSS. Negative stroke symptoms for EMS. Pt denies leg weakness. Pt c/o red spots on legs I thought it was poison ivy, but im not sure pt alert to self and place. Pt disoriented to time  (reports Sept 2022)

## 2021-06-28 NOTE — ED Provider Notes (Signed)
87 year old male with a history of atrial fibrillation, dementia, stroke presents with a chief complaint of bilateral leg weakness.  According to nursing staff EMS reported that the patient's nephew felt as though the patient was having increasing leg weakness and has had multiple falls recently.  Patient is demented and unable to provide a history or review of systems.  Spoke with the patient's nephew, Haldon Carley.  He informs me that the patient was found on the floor in his home today by his bed.  The patient's nephew was not able to get the patient to his house to stay for tonight.  The patient does live by himself at night but has a caretaker from 8 to 4 PM.           Past Medical History:   Diagnosis Date   ??? Arrhythmia    ??? Arthritis    ??? Atrial fibrillation (HCC)    ??? Chronic pain     RIGHT HIP   ??? Dementia (HCC)    ??? Hypercholesterolemia    ??? Insomnia    ??? Long term current use of anticoagulant therapy    ??? Pacemaker    ??? Stroke [facility])        Past Surgical History:   Procedure Laterality Date   ??? HX PACEMAKER     ??? PR ABDOMEN SURGERY PROC UNLISTED           Family History:   Problem Relation Age of Onset   ??? Cancer Mother         BREAST CA   ??? Heart Disease Father    ??? Heart Disease Brother        Social History     Socioeconomic History   ??? Marital status: WIDOWED     Spouse name: Not on file   ??? Number of children: Not on file   ??? Years of education: Not on file   ??? Highest education level: Not on file   Occupational History   ??? Not on file   Tobacco Use   ??? Smoking status: Current Every Day Smoker     Types: Cigars   ??? Smokeless tobacco: Never Used   ??? Tobacco comment: occasional   Vaping Use   ??? Vaping Use: Never used   Substance and Sexual Activity   ??? Alcohol use: Yes     Comment: 2/WEEK VODKA   ??? Drug use: No   ??? Sexual activity: Not Currently   Other Topics Concern   ??? Not on file   Social History Narrative   ??? Not on file     Social Determinants of Health     Financial Resource Strain:    ???  Difficulty of Paying Living Expenses: Not on file   Food Insecurity:    ??? Worried About Running Out of Food in the Last Year: Not on file   ??? Ran Out of Food in the Last Year: Not on file   Transportation Needs:    ??? Lack of Transportation (Medical): Not on file   ??? Lack of Transportation (Non-Medical): Not on file   Physical Activity:    ??? Days of Exercise per Week: Not on file   ??? Minutes of Exercise per Session: Not on file   Stress:    ??? Feeling of Stress : Not on file   Social Connections:    ??? Frequency of Communication with Friends and Family: Not on file   ??? Frequency of Social Gatherings  with Friends and Family: Not on file   ??? Attends Religious Services: Not on file   ??? Active Member of Clubs or Organizations: Not on file   ??? Attends Banker Meetings: Not on file   ??? Marital Status: Not on file   Intimate Partner Violence:    ??? Fear of Current or Ex-Partner: Not on file   ??? Emotionally Abused: Not on file   ??? Physically Abused: Not on file   ??? Sexually Abused: Not on file   Housing Stability:    ??? Unable to Pay for Housing in the Last Year: Not on file   ??? Number of Places Lived in the Last Year: Not on file   ??? Unstable Housing in the Last Year: Not on file         ALLERGIES: Flu vaccine qs2014-15(45mo,up)    Review of Systems   Unable to perform ROS: Dementia       Vitals:    06/28/21 1853   BP: 133/64   Pulse: 69   Resp: 18   Temp: 98.1 ??F (36.7 ??C)   SpO2: 96%            Physical Exam  Vitals and nursing note reviewed.   Constitutional:       General: He is not in acute distress.     Appearance: Normal appearance. He is not ill-appearing, toxic-appearing or diaphoretic.   HENT:      Head: Normocephalic.   Eyes:      Extraocular Movements: Extraocular movements intact.   Cardiovascular:      Rate and Rhythm: Normal rate.      Pulses: Normal pulses.      Heart sounds: Normal heart sounds.   Pulmonary:      Effort: Pulmonary effort is normal. No respiratory distress.      Breath sounds:  Normal breath sounds.   Abdominal:      General: There is no distension.   Musculoskeletal:         General: Normal range of motion.      Cervical back: Normal range of motion.   Skin:     General: Skin is dry.      Capillary Refill: Capillary refill takes less than 2 seconds.   Neurological:      Mental Status: He is alert. He is disoriented.   Psychiatric:         Mood and Affect: Mood normal.          MDM  Number of Diagnoses or Management Options  Diagnosis management comments:     87 year old male presents after a fall.  Plain film and CT imaging obtained to rule out traumatic injury.  Imaging unremarkable.  Labs unremarkable.  Spoke with the patient's relatives and he is not able to care for himself at home.  He will be held in the ED pending case management consult and placement.    Joaquim Nam: [phone]    7 AM  Change of shift.  Care of patient signed over to Dr. Dedra Skeens.  Bedside handoff complete. Awaiting placement.            Procedures

## 2021-06-28 NOTE — Progress Notes (Unsigned)
Critical care transport brought the patient into the emergency department as he was found wandering around the ER parking lot looking for his car.  Patient states "I am looking for my keys I would like to go home."  Informed that he was brought to the emergency department by EMS.  Patient rather insistent that he is going home via private vehicle.  States that he does live alone.  Charge nurse was made aware and patient was given a Pepsi to drink with crackers and sat next to the nurses station for closer monitoring.    Chari Manning, NP

## 2021-06-29 LAB — COMPREHENSIVE METABOLIC PANEL
ALT: 35 U/L (ref 12–78)
AST: 75 U/L — ABNORMAL HIGH (ref 15–37)
Albumin/Globulin Ratio: 1.2 (ref 1.1–2.2)
Albumin: 4.2 g/dL (ref 3.5–5.0)
Alkaline Phosphatase: 90 U/L (ref 45–117)
Anion Gap: 6 mmol/L (ref 5–15)
BUN: 27 MG/DL — ABNORMAL HIGH (ref 6–20)
Bun/Cre Ratio: 20 (ref 12–20)
CO2: 26 mmol/L (ref 21–32)
Calcium: 10 MG/DL (ref 8.5–10.1)
Chloride: 111 mmol/L — ABNORMAL HIGH (ref 97–108)
Creatinine: 1.33 MG/DL — ABNORMAL HIGH (ref 0.70–1.30)
EGFR IF NonAfrican American: 51 mL/min/{1.73_m2} — ABNORMAL LOW (ref >60)
GFR African American: >60 mL/min/{1.73_m2} (ref >60)
Globulin: 3.6 g/dL (ref 2.0–4.0)
Glucose: 121 mg/dL — ABNORMAL HIGH (ref 65–100)
Potassium: 4 mmol/L (ref 3.5–5.1)
Sodium: 143 mmol/L (ref 136–145)
Total Bilirubin: 1.4 MG/DL — ABNORMAL HIGH (ref 0.2–1.0)
Total Protein: 7.8 g/dL (ref 6.4–8.2)

## 2021-06-29 LAB — URINALYSIS WITH MICROSCOPIC
BACTERIA, URINE: NEGATIVE /hpf
Bilirubin, Urine: NEGATIVE
Glucose, Ur: NEGATIVE mg/dL
Leukocyte Esterase, Urine: NEGATIVE
Nitrite, Urine: NEGATIVE
Protein, UA: 30 mg/dL — AB
Specific Gravity, UA: 1.029 (ref 1.003–1.030)
Urobilinogen, UA, POCT: 0.2 EU/dL (ref 0.2–1.0)
pH, UA: 5 (ref 5.0–8.0)

## 2021-06-29 LAB — CBC WITH AUTO DIFFERENTIAL
Basophils %: 0 % (ref 0–1)
Basophils Absolute: 0 10*3/uL (ref 0.0–0.1)
Eosinophils %: 0 % (ref 0–7)
Eosinophils Absolute: 0 10*3/uL (ref 0.0–0.4)
Granulocyte Absolute Count: 0 10*3/uL (ref 0.00–0.04)
Hematocrit: 42.9 % (ref 36.6–50.3)
Hemoglobin: 14.3 g/dL (ref 12.1–17.0)
Immature Granulocytes: 0 % (ref 0.0–0.5)
Lymphocytes %: 12 % (ref 12–49)
Lymphocytes Absolute: 1.1 10*3/uL (ref 0.8–3.5)
MCH: 33.3 PG (ref 26.0–34.0)
MCHC: 33.3 g/dL (ref 30.0–36.5)
MCV: 99.8 FL — ABNORMAL HIGH (ref 80.0–99.0)
MPV: 11.1 FL (ref 8.9–12.9)
Monocytes %: 10 % (ref 5–13)
Monocytes Absolute: 0.9 10*3/uL (ref 0.0–1.0)
NRBC Absolute: 0 10*3/uL (ref 0.00–0.01)
Neutrophils %: 78 % — ABNORMAL HIGH (ref 32–75)
Neutrophils Absolute: 7.3 10*3/uL (ref 1.8–8.0)
Nucleated RBCs: 0 PER 100 WBC
Platelets: 90 10*3/uL — ABNORMAL LOW (ref 150–400)
RBC: 4.3 M/uL (ref 4.10–5.70)
RDW: 14 % (ref 11.5–14.5)
WBC: 9.3 10*3/uL (ref 4.1–11.1)

## 2021-06-29 LAB — CK
CK: 2572 U/L — ABNORMAL HIGH (ref 39–308)
Total CK: 2572 U/L — ABNORMAL HIGH (ref 39–308)

## 2021-06-29 LAB — URINALYSIS W/MICROSCOPIC
Bacteria: NEGATIVE /hpf
Bilirubin: NEGATIVE
Glucose: NEGATIVE mg/dL
Leukocyte Esterase: NEGATIVE
Nitrites: NEGATIVE
Protein: 30 mg/dL — AB
Specific gravity: 1.029 (ref 1.003–1.030)
Urobilinogen: 0.2 EU/dL (ref 0.2–1.0)
pH (UA): 5 (ref 5.0–8.0)

## 2021-06-29 LAB — SAMPLES BEING HELD

## 2021-06-29 LAB — CBC WITH AUTOMATED DIFF
ABS. BASOPHILS: 0 10*3/uL (ref 0.0–0.1)
ABS. EOSINOPHILS: 0 10*3/uL (ref 0.0–0.4)
ABS. IMM. GRANS.: 0 10*3/uL (ref 0.00–0.04)
ABS. LYMPHOCYTES: 1.1 10*3/uL (ref 0.8–3.5)
ABS. MONOCYTES: 0.9 10*3/uL (ref 0.0–1.0)
ABS. NEUTROPHILS: 7.3 10*3/uL (ref 1.8–8.0)
ABSOLUTE NRBC: 0 10*3/uL (ref 0.00–0.01)
BASOPHILS: 0 % (ref 0–1)
EOSINOPHILS: 0 % (ref 0–7)
HCT: 42.9 % (ref 36.6–50.3)
HGB: 14.3 g/dL (ref 12.1–17.0)
IMMATURE GRANULOCYTES: 0 % (ref 0.0–0.5)
LYMPHOCYTES: 12 % (ref 12–49)
MCH: 33.3 PG (ref 26.0–34.0)
MCHC: 33.3 g/dL (ref 30.0–36.5)
MCV: 99.8 FL — ABNORMAL HIGH (ref 80.0–99.0)
MONOCYTES: 10 % (ref 5–13)
MPV: 11.1 FL (ref 8.9–12.9)
NEUTROPHILS: 78 % — ABNORMAL HIGH (ref 32–75)
NRBC: 0 PER 100 WBC
PLATELET: 90 10*3/uL — ABNORMAL LOW (ref 150–400)
RBC: 4.3 M/uL (ref 4.10–5.70)
RDW: 14 % (ref 11.5–14.5)
WBC: 9.3 10*3/uL (ref 4.1–11.1)

## 2021-06-29 LAB — METABOLIC PANEL, COMPREHENSIVE
A-G Ratio: 1.2 (ref 1.1–2.2)
ALT (SGPT): 35 U/L (ref 12–78)
AST (SGOT): 75 U/L — ABNORMAL HIGH (ref 15–37)
Albumin: 4.2 g/dL (ref 3.5–5.0)
Alk. phosphatase: 90 U/L (ref 45–117)
Anion gap: 6 mmol/L (ref 5–15)
BUN/Creatinine ratio: 20 (ref 12–20)
BUN: 27 MG/DL — ABNORMAL HIGH (ref 6–20)
Bilirubin, total: 1.4 MG/DL — ABNORMAL HIGH (ref 0.2–1.0)
CO2: 26 mmol/L (ref 21–32)
Calcium: 10 MG/DL (ref 8.5–10.1)
Chloride: 111 mmol/L — ABNORMAL HIGH (ref 97–108)
Creatinine: 1.33 MG/DL — ABNORMAL HIGH (ref 0.70–1.30)
GFR est AA: >60 mL/min/{1.73_m2} (ref >60)
GFR est non-AA: 51 mL/min/{1.73_m2} — ABNORMAL LOW (ref >60)
Globulin: 3.6 g/dL (ref 2.0–4.0)
Glucose: 121 mg/dL — ABNORMAL HIGH (ref 65–100)
Potassium: 4 mmol/L (ref 3.5–5.1)
Protein, total: 7.8 g/dL (ref 6.4–8.2)
Sodium: 143 mmol/L (ref 136–145)

## 2021-06-29 LAB — URINE CULTURE HOLD SAMPLE

## 2021-06-29 MED ORDER — LORAZEPAM 0.5 MG TAB
0.5 mg | Freq: Once | ORAL | Status: AC
Start: 2021-06-29 — End: 2021-06-29
  Administered 2021-06-29: 09:00:00 via ORAL

## 2021-06-29 MED FILL — LORAZEPAM 0.5 MG TAB: 0.5 mg | ORAL | Qty: 1

## 2021-06-29 NOTE — Progress Notes (Signed)
 Problem: Mobility Impaired (Adult and Pediatric)  Goal: *Acute Goals and Plan of Care (Insert Text)  Description: FUNCTIONAL STATUS PRIOR TO ADMISSION: Patient was independent and active without use of DME. Patient was modified independent for basic and minimal assistance for instrumental ADLs.    HOME SUPPORT PRIOR TO ADMISSION: The patient lived alone with care aides 7 days/wk from 9-4 to provide assistance.    Physical Therapy Goals  Initiated 06/29/2021  1.  Patient will move from supine to sit and sit to supine  in bed with modified independence within 7 day(s).    2.  Patient will transfer from bed to chair and chair to bed with supervision/set-up using the least restrictive device within 7 day(s).  3.  Patient will perform sit to stand with supervision/set-up within 7 day(s).  4.  Patient will ambulate with supervision/set-up for 150 feet with the least restrictive device within 7 day(s).   5.  Patient will ascend/descend 2 stairs with one handrail(s) with supervision/set-up within 7 day(s).    Outcome: Progressing Towards Goal  PHYSICAL THERAPY EMERGENCY DEPARTMENT EVALUATION     Patient: [name] (87 y.o. male)  Date: 06/29/2021  Primary Diagnosis: No admission diagnoses are documented for this encounter.        Precautions:   Fall    ASSESSMENT  Based on the objective data described below, the patient presents with decreased activity tolerance, balance deficits, and weakness. Pt received supine in stretcher with legs dangling off end. Increased time and cues to readjust pt and transfer to sitting. Pt demonstrated posterior LOB upon standing requiring assistance to maintain balance. Pt initially requiring min A for ambulation progressing to intermittent CGA yet demonstrating increased lateral trunk sway. At this time, recommend SNF upon discharge as pt below baseline for mobility and would require assistance with all mobility.     Current Level of Function Impacting Discharge (mobility/balance): min A  transfers    Functional Outcome Measure:  The patient scored 50/100 on the Barthel Index outcome measure which is indicative of patient is partially dependent with self care.      Other factors to consider for discharge: fall risk, alone at night      Patient will benefit from continued physical therapy if admitted.     PLAN :  Recommendations and Planned Interventions: bed mobility training, transfer training, gait training, therapeutic exercises, neuromuscular re-education, patient and family training/education, and therapeutic activities      Frequency/Duration: Patient will be followed by physical therapy:  5 times a week to address goals.    Recommendation for discharge: (in order for the patient to meet his/her long term goals)  Therapy up to 5 days/week in SNF setting  If pt were to return home, recommend HHPT and pt would require assistance/supervision with all mobility as pt is a fall risk  This discharge recommendation:  Has not yet been discussed the attending provider and/or case management    Equipment recommendations for successful discharge (if) home: walker: rolling     SUBJECTIVE:   Patient stated "oh, where are we going."    OBJECTIVE DATA SUMMARY:   HISTORY:    Past Medical History:   Diagnosis Date    Arrhythmia     Arthritis     Atrial fibrillation (HCC)     Chronic pain     RIGHT HIP    Dementia (HCC)     Hypercholesterolemia     Insomnia     Long term current use of  anticoagulant therapy     Pacemaker     Stroke [facility])      Past Surgical History:   Procedure Laterality Date    HX PACEMAKER      PR ABDOMEN SURGERY PROC UNLISTED         Home Situation  Home Environment: Private residence  Living Alone: Yes  Support Systems: Child(ren),Caregiver/Home Care Staff  Current DME Used/Available at Home: None    EXAMINATION/PRESENTATION/DECISION MAKING:   Critical Behavior:              Hearing:     Skin:  intact  Range Of Motion:  AROM: Within functional limits           PROM: Within functional limits            Strength:    Strength: Generally decreased, functional       Coordination:  Coordination: Generally decreased, functional  Vision:          Functional Mobility:  Bed Mobility:     Supine to Sit: Minimum assistance  Sit to Supine: Minimum assistance     Transfers:  Sit to Stand: Minimum assistance;Adaptive equipment;Additional time  Stand to Sit: Minimum assistance;Adaptive equipment;Additional time       Balance:   Sitting: Impaired  Sitting - Static: Good (unsupported)  Sitting - Dynamic: Fair (occasional)  Standing: Impaired;With support  Standing - Static: Fair  Standing - Dynamic : Constant support;Fair  Ambulation/Gait Training:  Distance (ft): 100 Feet (ft) (+75)  Assistive Device: Gait belt;Walker, rolling  Ambulation - Level of Assistance: Minimal assistance;Adaptive equipment;Additional time;Contact guard assistance        Gait Abnormalities: Decreased step clearance;Trunk sway increased;Path deviations        Base of Support: Narrowed     Speed/Cadence: Pace decreased (<100 feet/min);Slow  Step Length: Left shortened;Right shortened    Functional Measure:  Barthel Index:    Bathing: 0  Bladder: 5  Bowels: 5  Grooming: 0  Dressing: 5  Feeding: 10  Mobility: 10  Stairs: 0  Toilet Use: 5  Transfer (Bed to Chair and Back): 10  Total: 50/100       The Barthel ADL Index: Guidelines  1. The index should be used as a record of what a patient does, not as a record of what a patient could do.  2. The main aim is to establish degree of independence from any help, physical or verbal, however minor and for whatever reason.  3. The need for supervision renders the patient not independent.  4. A patient's performance should be established using the best available evidence. Asking the patient, friends/relatives and nurses are the usual sources, but direct observation and common sense are also important. However direct testing is not needed.  5. Usually the patient's performance over the preceding 24-48 hours is  important, but occasionally longer periods will be relevant.  6. Middle categories imply that the patient supplies over 50 per cent of the effort.  7. Use of aids to be independent is allowed.    Score Interpretation (from Sinoff 1997)   80-100 Independent   60-79 Minimally independent   40-59 Partially dependent   20-39 Very dependent   <20 Totally dependent     -Mahoney, F.l., Barthel, D.W. (1965). Functional evaluation: the Barthel Index. [place] [address] [place] (14)2.  -Sinoff, G., Ore, L. (1997). The Barthel activities of daily living index: self-reporting versus actual performance in the old (> or = 75 years). Journal of American  Geriatric Society 45(7), [phone].   Marea cotton [place], J.J.M.F, Orman ROES., Oneita MERYL Sebastian DELORSE. (1999). Measuring the change in disability after inpatient rehabilitation; comparison of the responsiveness of the Barthel Index and Functional Independence Measure. Journal of Neurology, Neurosurgery, and Psychiatry, 66(4), [phone].  Marea [place], N.J.A, Scholte op [place],  W.J.M, & Koop[place]chap, M.A. (2004) Assessment of post-stroke quality of life in cost-effectiveness studies: The usefulness of the Barthel Index and the EuroQoL-5D. Quality of Life Research, 47, 572-56            Physical Therapy Evaluation Charge Determination   History Examination Presentation Decision-Making   HIGH Complexity :3+ comorbidities / personal factors will impact the outcome/ POC  LOW Complexity : 1-2 Standardized tests and measures addressing body structure, function, activity limitation and / or participation in recreation  MEDIUM Complexity : Evolving with changing characteristics  Other outcome measures barthel  MEDIUM      Based on the above components, the patient evaluation is determined to be of the following complexity level: LOW         Activity Tolerance:   Fair and requires rest breaks    After treatment patient left in no apparent distress:   Supine in bed and Call bell within  reach    COMMUNICATION/EDUCATION:   Communication/Collaboration:  Fall prevention education was provided and the patient/caregiver indicated understanding., Patient/family have participated as able in goal setting and plan of care., and Patient/family agree to work toward stated goals and plan of care.    Findings and recommendations were discussed with: MD physician and Registered nurse.     Thank you for this referral.  Annitta Fiedler, PT   Time Calculation: 17 mins

## 2021-06-29 NOTE — ED Notes (Signed)
Pt. Seen by case management. Pt. Was placed in [place] rehab and will be brought there today by family.      Verl Bangs, MD  2:05 PM

## 2021-06-29 NOTE — ED Notes (Signed)
PT evaluating pt.

## 2021-06-29 NOTE — Progress Notes (Signed)
10:12 AM  Date of previous inpatient admission/ ED visit?  12/19/2017-12/24/2017 [facility] INP-Acute CVA    What brought the patient back to ED? Chart reviewed: Pt arrives via ems from nephews house. EMS reports the nephew reports bilateral leg weakness and increase falls. BS 129 and all other VSS. Negative stroke symptoms for EMS. Pt denies leg weakness. Pt c/o red spots on legs "I thought it was poison ivy, but im not sure" pt alert to self and place. Pt disoriented to time  (reports Sept 2022)    Did patient decline recommended services during last admission/ ED visit (if yes, what)? No    Has patient seen a provider since their last inpatient admission/ED visit (if yes, when)? Yes, 05/28/21    PCP: YES First and Last name: Laurence Spates, NP   Name of Practice:  [facility]   Are you a current patient: Yes/No:  YES   Approximate date of last visit: 05/28/21    Patient is followed in the community by Cardiology (last seen-06/03/21) and Neurology (last seen-06/26/21)    CM Interventions:  From previous inpatient admission/ED visit: Patient transitioned to [facility] for skilled services.  Family Transported.    From current inpatient admission/ED visit:  Patient currently in the ED being evaluated and needs assessed.  CM consult received for placement.  PT/OT have been consulted.  CM currently awaiting review and recommendations.    Initial assessment completed with patient's nephew: Tandy Luckadoo [phone].  Other identified family support is patient's sister-in-law Rogue Diederich [phone].    87 year old male with hx of dementia, A-Fib, and stroke.  Resides alone in his own home with private care ([facility]) 7 days a week between the hours 9-4 pm.  Care providers assist patient with cooking, cleaning, laying out clothes, etc.  Patient has been independent with ADL's.  Recent decline with bilateral leg weakness.  Prior to coming into the hospital family was working to establish  overnight companion care through [employer].  No DME is utilized.  Receives Psychiatrist as source of income with no significant financial stressors or concerns.  Insurance verified: Medicare A&B/ [employer].  [facility].  Has utilized [facility] in the past for home health services.  Patient with ACP documentation on file as well with identified POA: Primary-Maryanne Tuley and [employer].    At this time family believes patient would benefit from a skilled rehab stay. Family in-route to hospital.  CM to provide a list of facilities for family to review.  Family is also interested in next level of care services for patient as well.  CM to also provide resources for [employer] and other agencies who help identify placement in the community.    Awaiting therapy review at this time.  Will continue to follow and assist with TOC needs as they arise.    1:50 PM  Therapy recommended Home Health vs Rehab.  Family would like to move forward with Rehab.  Referrals placed.    CM received notification that [facility] is able to accept and accommodate patient for skilled rehab.  Facility able to accommodate memory care unit.  Patient's family is in agreement.  Patient will be going to room 332 B.  RN to call report [phone] [facility].  Patient's family to transport.  Family will notify CM upon there arrival.    Alcario Drought, MSW/CRM  Care Management

## 2021-06-29 NOTE — Progress Notes (Signed)
Orders received, chart reviewed and patient evaluated by physical therapy. Pending progression with skilled acute physical therapy, recommend:  Therapy up to 5 days/week in SNF setting vs HH PT and assistance from family    Recommend with nursing OOB to chair 3x/day and walking daily with 1 assist and walker. Thank you for completing as able in order to maintain patient strength, endurance and independence.     Full evaluation to follow.

## 2021-06-29 NOTE — ED Notes (Signed)
Family at bedside. Pt assisted to wheelchair by ED staff. Pt taken to [place] by family members. Patient condition stable, respiratory status within normal limits, neuro status intact.

## 2021-06-29 NOTE — ED Notes (Signed)
Patient noted to be repeatedly exiting ED stretcher, difficulty to reorient. This RN and other RN assisted patient back to bed multiple times. Patient shows no evidence of learning related to fall risk. Patient has previously fallen in lobby today. Discussed high fall risk concern with Dr Sena Slate.

## 2021-07-05 NOTE — Telephone Encounter (Signed)
Alona from [facility] called wants to know if provider will follow home health for the patient.    Requesting a call back    Best call back #[phone]

## 2021-07-15 NOTE — Telephone Encounter (Signed)
pt wife said pt is on 24hr home care and cant make to appt 07/16/21. will call back to rescheule. 08/01/22TM

## 2021-07-16 ENCOUNTER — Encounter: Attending: Family Medicine | Primary: Family

## 2021-07-24 NOTE — Telephone Encounter (Signed)
Patient sister - in-law calling to update his current condition.    They would like to discuss getting him into a assist living facility.     Please call,.

## 2021-07-25 NOTE — Telephone Encounter (Signed)
07/25/2021 at 4:36 conversation with patients sister-in-law [place]) patient has had a stroke and is now with in home care

## 2021-07-25 NOTE — Telephone Encounter (Signed)
Spoke at length with his sister in law they have an apartment on hold at [facility].  The nephew and sister in law have power of attorney for him they can not care for him safely at home any more.  The family is asking  for Dr. Lurena Nida to also encourage him that is in needed for his safety. They have an appt. On the 18th.

## 2021-08-01 ENCOUNTER — Ambulatory Visit: Attending: Neurology | Primary: Family

## 2021-08-01 ENCOUNTER — Ambulatory Visit: Admit: 2021-08-01 | Payer: MEDICARE | Attending: Neurology | Primary: Family

## 2021-08-01 DIAGNOSIS — G301 Alzheimer's disease with late onset: Secondary | ICD-10-CM

## 2021-08-01 DIAGNOSIS — F0280 Dementia in other diseases classified elsewhere without behavioral disturbance: Secondary | ICD-10-CM

## 2021-08-01 NOTE — Progress Notes (Signed)
Neurology Clinic Follow up Note    Patient ID:  CRIMSON BEER  [id number]  87 y.o.  [birth year]      Mr. Schweitzer is here for follow up today of stroke, dementia.       Last Appointment With Me:  06/26/2021    Interval History:   Patient is here for f/u sooner follow up. Previously seen due to h/o L MCA small territory infarct, L M2/3 occlusion not amenable to endovascular intervention.  Mechanism likely cardioembolic in setting of Afib. S/P PPM last hospitalization.    No new deficits/focal weakness/numbness/vision loss.  He remains on Eliquis for Afib/stroke prevention.  No reported side effects/hemorrhage.    He is here with his nephew/POA today for sooner follow up. He has suffered a recent fall at home prompting ED evaluation. Head CT was performed showing chronic infarct R temporal region not previously visualized on imaging from 2019, no acute hemorrhage. He reports he is compliant with Eliquis.   Family is looking into transitioning him to an assisted living facility due to progressive cognitive decline and caregiver stress. His nephew is unable to care for him presently due to his own mother's health conditions. He typically assists with buying groceries, taking out trash among other household duties Mr. Ybarbo has difficulty performing. In addition, his current household has stairs which prohibit him from using much of the area and present a fall hazard. Patient prefers to stay within his home with 24h caregiver assistance. Family feels this is not a viable option.   He is using his walker to assist with ambulation.   He appears to be tolerating Aricept and Namenda well without side effects.   No reported behavioral concerns.     PMHx/ PSHx/ FHx/ SHx:  Reviewed and unchanged previous visit.   Past Medical History:   Diagnosis Date    Arrhythmia     Arthritis     Atrial fibrillation (HCC)     Chronic pain     RIGHT HIP    Dementia ([place])     Hypercholesterolemia     Insomnia     Long term current use of  anticoagulant therapy     Pacemaker     Stroke ([place])          ROS:  Comprehensive review of systems negative except for as noted above.       Objective:       Meds:  Current Outpatient Medications   Medication Sig Dispense Refill    donepeziL (ARICEPT) 10 mg tablet Take 1 Tablet by mouth nightly. 90 Tablet 1    atorvastatin (LIPITOR) 20 mg tablet TAKE 1 TABLET BY MOUTH EVERY DAY 90 Tablet 0    levothyroxine (SYNTHROID) 50 mcg tablet TAKE 1 TABLET BY MOUTH 30 MINUTES BEFORE BREAKFAST 90 Tablet 0    apixaban (Eliquis) 5 mg tablet TAKE 1 TABLET BY MOUTH TWICE A DAY(MUST MAKE APPT W/MD FOR FURTHER FILLS) 60 Tablet 3    fludrocortisone (FLORINEF) 0.1 mg tablet TAKE 1/2 TABLET BY MOUTH EVERY DAY 45 Tablet 7    diphenhydrAMINE (BENADRYL) 25 mg tablet Take 25 mg by mouth as needed for Allergies.      memantine ER (NAMENDA XR) 28 mg capsule Take 1 Capsule by mouth daily. 90 Capsule 1       Exam:  Visit Vitals  BP 132/68   Pulse 87   Ht '6\' 1"'  (1.854 m)   SpO2 98%   BMI 23.48 kg/m??  NEUROLOGICAL EXAM:  General: Awake, alert, speech fluent. Oriented to self, place, not date. Delayed recall impaired.   CN: PERRL, EOMI without nystagmus, VFF to confrontation, facial sensation and strength are normal and symmetric, hearing is intact to finger rub bilaterally, palate and tongue movements are intact and symmetric.  Motor: Normal tone, bulk and strength bilaterally.  Reflexes: Deferred  Coordination: FNF, RAM, HTS intact.  Sensation: LT intact throughout.  Gait: Slightly unsteady, uses walker for assistance.     LABS  Results for orders placed or performed during the hospital encounter of 06/28/21   URINE CULTURE HOLD SAMPLE    Specimen: Serum; Urine   Result Value Ref Range    Urine culture hold        Urine on hold in Microbiology dept for 2 days.  If unpreserved urine is submitted, it cannot be used for addtional testing after 24 hours, recollection will be required.   CBC WITH AUTOMATED DIFF   Result Value Ref Range    WBC  9.3 4.1 - 11.1 K/uL    RBC 4.30 4.10 - 5.70 M/uL    HGB 14.3 12.1 - 17.0 g/dL    HCT 42.9 36.6 - 50.3 %    MCV 99.8 (H) 80.0 - 99.0 FL    MCH 33.3 26.0 - 34.0 PG    MCHC 33.3 30.0 - 36.5 g/dL    RDW 14.0 11.5 - 14.5 %    PLATELET 90 (L) 150 - 400 K/uL    MPV 11.1 8.9 - 12.9 FL    NRBC 0.0 0 PER 100 WBC    ABSOLUTE NRBC 0.00 0.00 - 0.01 K/uL    NEUTROPHILS 78 (H) 32 - 75 %    LYMPHOCYTES 12 12 - 49 %    MONOCYTES 10 5 - 13 %    EOSINOPHILS 0 0 - 7 %    BASOPHILS 0 0 - 1 %    IMMATURE GRANULOCYTES 0 0.0 - 0.5 %    ABS. NEUTROPHILS 7.3 1.8 - 8.0 K/UL    ABS. LYMPHOCYTES 1.1 0.8 - 3.5 K/UL    ABS. MONOCYTES 0.9 0.0 - 1.0 K/UL    ABS. EOSINOPHILS 0.0 0.0 - 0.4 K/UL    ABS. BASOPHILS 0.0 0.0 - 0.1 K/UL    ABS. IMM. GRANS. 0.0 0.00 - 0.04 K/UL    DF SMEAR SCANNED      RBC COMMENTS NORMOCYTIC, NORMOCHROMIC     METABOLIC PANEL, COMPREHENSIVE   Result Value Ref Range    Sodium 143 136 - 145 mmol/L    Potassium 4.0 3.5 - 5.1 mmol/L    Chloride 111 (H) 97 - 108 mmol/L    CO2 26 21 - 32 mmol/L    Anion gap 6 5 - 15 mmol/L    Glucose 121 (H) 65 - 100 mg/dL    BUN 27 (H) 6 - 20 MG/DL    Creatinine 1.33 (H) 0.70 - 1.30 MG/DL    BUN/Creatinine ratio 20 12 - 20      GFR est AA >60 >60 ml/min/1.27m    GFR est non-AA 51 (L) >60 ml/min/1.760m   Calcium 10.0 8.5 - 10.1 MG/DL    Bilirubin, total 1.4 (H) 0.2 - 1.0 MG/DL    ALT (SGPT) 35 12 - 78 U/L    AST (SGOT) 75 (H) 15 - 37 U/L    Alk. phosphatase 90 45 - 117 U/L    Protein, total 7.8 6.4 - 8.2 g/dL    Albumin 4.2  3.5 - 5.0 g/dL    Globulin 3.6 2.0 - 4.0 g/dL    A-G Ratio 1.2 1.1 - 2.2     SAMPLES BEING HELD   Result Value Ref Range    SAMPLES BEING HELD 1RED,1BLUE     COMMENT        Add-on orders for these samples will be processed based on acceptable specimen integrity and analyte stability, which may vary by analyte.   CK   Result Value Ref Range    CK 2,572 (H) 39 - 308 U/L   URINALYSIS W/MICROSCOPIC   Result Value Ref Range    Color YELLOW/STRAW      Appearance CLEAR CLEAR       Specific gravity 1.029 1.003 - 1.030      pH (UA) 5.0 5.0 - 8.0      Protein 30 (A) NEG mg/dL    Glucose Negative NEG mg/dL    Ketone TRACE (A) NEG mg/dL    Bilirubin Negative NEG      Blood SMALL (A) NEG      Urobilinogen 0.2 0.2 - 1.0 EU/dL    Nitrites Negative NEG      Leukocyte Esterase Negative NEG      WBC 0-4 0 - 4 /hpf    RBC 0-5 0 - 5 /hpf    Epithelial cells FEW FEW /lpf    Bacteria Negative NEG /hpf       IMAGING:  MRI Results (most recent):  Results from Hospital Encounter encounter on 12/19/17   MRI BRAIN WO CONT    Narrative *PRELIMINARY REPORT*    Punctate foci of diffusion restriction in the left frontal lobe are compatible  with acute small vessel infarcts. No evidence of intracranial hemorrhage or  mass. Mild periventricular and subcortical white matter disease, compatible with  chronic small vessel ischemia.    Preliminary report was provided by Dr. Sharol Roussel, the on-call radiologist, at 1:30  PM.    Final report to follow.    *END PRELIMINARY REPORT*    EXAM:  MRI BRAIN WO CONT  History: Dysarthria  INDICATION:  Dysarthria    COMPARISON: CTA 12/19/2017.    CONTRAST:  None.    TECHNIQUE: Sagittal T1, axial FLAIR, T2,T1 and gradient echo images as well as  coronal T2 weighted images and axial diffusion weighted images of the head were  obtained.    FINDINGS:      Small less than 5 mm foci of cortically based infarction in the left frontal  lobe. Anterior division MCA vascular territory. No other foci of infarction. No  Chiari or syrinx. Pituitary infundibulum unremarkable. Scattered foci of  increased T2 signal intensity corona radiata and centrum semiovale. Small remote  lacunar infarctions in the cerebral white matter on the right and on the left.  Mucoperiosteal thickening in the left maxillary sinus. Left frontal sinus  disease. Sphenoid sinus disease as well. Trace fluid in the vascular cells.  There is no evidence of mass, hemorrhage or abnormal extra-axial fluid  collection.  Normal appearing  flow-voids are present in the vertebral, basilar  and carotid artery systems. The craniocervical junction is normal.     Impression IMPRESSION:     Small acute cortical infarction left MCA M2 anterior division segment.  Mild paranasal sinus disease. Greater on the left.  Mild chronic microvascular ischemic change with additional scattered remote  lacunar infarctions in the cerebral white matter. Mild cerebral atrophy.  Correlates with CTA findings of occluded small left MCA territory segmental  branch.                 Assessment:     Encounter Diagnoses     ICD-10-CM ICD-9-CM   1. Late onset Alzheimer's dementia without behavioral disturbance (HCC)  G30.1 331.0    F02.80 294.10   2. Cerebrovascular accident (CVA), unspecified mechanism ([place])  I63.9 434.91   3. Paroxysmal atrial fibrillation (HCC)  I48.0 85.8      87 year old RHM here for f/u of L MCA embolic appearing small territory infarct, L M2/3 occlusion not amenable to endovascular intervention in the setting of atrial fibrillation diagnosed on prior admission. Remains on NOAC/Eliquis and statin therapy. He denies new focal neurologic deficits, however did complete recent head CT due to a fall at home which revealed a territory of chronic R temporal ischemia not previously visualized on imaging from 2019. We discussed his risk factors for stroke and elect to continue with NOAC for stroke prevention. Defer escalation of therapy/addition of antiplatelet therapy due to bleeding risk.   In regards to his dementia, this is ongoing and progressive. Prior [facility] 17/30 with more prominent executive dysfunction, disorientation. We discussed need for assistance with finances and executive decisions due to evidence of moderate dementia on examination. Suspect Alzheimer's dementia subtype. Will continue Aricept and Namenda as prescribed to assist with memory deficits, which he appears to be tolerating well. Discussed use of an assist device at all times to prevent falls.  Long discussion regarding transition to assisted living versus memory care facility which would aid in overall safety as well as socialization.   Plan:   Continue Aricept 38m qhs, Namenda XR 247mdaily  Cont. Eliquis for stroke prevention  Cont. Statin therapy  Goal SBP<140, LDL<70   Agree with transition to ALF  Recommend assistance with finances/executive decisions   Continue use of a walker to assist with ambulation      Follow-up and Dispositions    Return in about 6 months (around 02/01/2022).         Signed:  RaOrson ApeDO  08/01/2021

## 2021-08-09 ENCOUNTER — Ambulatory Visit: Attending: Family | Primary: Family

## 2021-08-09 ENCOUNTER — Ambulatory Visit: Admit: 2021-08-09 | Payer: MEDICARE | Attending: Family | Primary: Family

## 2021-08-09 DIAGNOSIS — Z0000 Encounter for general adult medical examination without abnormal findings: Secondary | ICD-10-CM

## 2021-08-09 NOTE — Progress Notes (Signed)
 Chief Complaint   Patient presents with    Annual Wellness Visit     Follow up       1. Have you been to the ER, urgent care clinic since your last visit?  Hospitalized since your last visit? Yes When: 06/28/21 Where: [facility] Reason for visit: fall    2. Have you seen or consulted any other health care providers outside of the [facility] since your last visit? No     3. For patients aged 85-75: Has the patient had a colonoscopy / FIT/ Cologuard? NA - based on age      If the patient is male:    4. For patients aged 9-74: Has the patient had a mammogram within the past 2 years? NA - based on age or sex      29. For patients aged 21-65: Has the patient had a pap smear? NA - based on age or sex         3 most recent PHQ Screens 08/09/2021   Little interest or pleasure in doing things Not at all   Feeling down, depressed, irritable, or hopeless Not at all   Total Score PHQ 2 0       Abuse Screening Questionnaire 08/09/2021   Do you ever feel afraid of your partner? N   Are you in a relationship with someone who physically or mentally threatens you? N   Is it safe for you to go home? Y       ADL Assessment 08/09/2021   Feeding yourself Help Needed   Getting from bed to chair Help Needed   Getting dressed Help Needed   Bathing or showering Help Needed   Walk across the room (includes cane/walker) Help Needed   Using the telphone Help Needed   Taking your medications Help Needed   Preparing meals Help Needed   Managing money (expenses/bills) Help Needed   Moderately strenuous housework (laundry) Help Needed   Shopping for personal items (toiletries/medicines) Help Needed   Shopping for groceries Help Needed   Driving Help Needed   Climbing a flight of stairs Help Needed   Getting to places beyond walking distances Help Needed       Fall Risk Assessment, last 12 mths 08/09/2021   Able to walk? Yes   Fall in past 12 months? 1   Do you feel unsteady? 1   Are you worried about falling 0   Is TUG test greater than  12 seconds? 0   Is the gait abnormal? 0   Number of falls in past 12 months 2   Fall with injury? 0         Health Maintenance Due   Topic Date Due    Shingrix Vaccine Age 65> (1 of 2) Never done    COVID-19 Vaccine (4 - Booster for [employer] series) 03/24/2021    Medicare Yearly Exam  06/14/2021

## 2021-08-09 NOTE — Progress Notes (Signed)
[facility]  Clinic Note  Subjective:      [name] is a 87 y.o. male who presents for medicare physical and form completion for admission to assisted living . He needs TB test. He is accompanied by his nephew.  He has history of dementia, hyperlipidemia, pacemaker and hypothyroidism. His labs were done 3 months ago, will repeat in November      Past Medical History:   Diagnosis Date    Arrhythmia     Arthritis     Atrial fibrillation (HCC)     Chronic pain     RIGHT HIP    Dementia (HCC)     Hypercholesterolemia     Insomnia     Long term current use of anticoagulant therapy     Pacemaker     Stroke [facility](HCC)      Past Surgical History:   Procedure Laterality Date    HX PACEMAKER      PR ABDOMEN SURGERY PROC UNLISTED       Current Outpatient Medications   Medication Sig Dispense Refill    donepeziL (ARICEPT) 10 mg tablet Take 1 Tablet by mouth nightly. 90 Tablet 1    memantine ER (NAMENDA XR) 28 mg capsule Take 1 Capsule by mouth daily. 90 Capsule 1    atorvastatin (LIPITOR) 20 mg tablet TAKE 1 TABLET BY MOUTH EVERY DAY 90 Tablet 0    levothyroxine (SYNTHROID) 50 mcg tablet TAKE 1 TABLET BY MOUTH 30 MINUTES BEFORE BREAKFAST 90 Tablet 0    apixaban (Eliquis) 5 mg tablet TAKE 1 TABLET BY MOUTH TWICE A DAY(MUST MAKE APPT W/MD FOR FURTHER FILLS) 60 Tablet 3    fludrocortisone (FLORINEF) 0.1 mg tablet TAKE 1/2 TABLET BY MOUTH EVERY DAY 45 Tablet 7    diphenhydrAMINE (BENADRYL) 25 mg tablet Take 25 mg by mouth as needed for Allergies.       Allergies   Allergen Reactions    Flu Vaccine Qs2014-15(2892mo,Up) Rash     States not an allergy 11/23/19       ROS:   Complete review of systems was reviewed with pertinent information listed in HPI.  Review of Systems   Constitutional: Negative.    HENT: Negative.     Respiratory: Negative.     Cardiovascular: Negative.    Gastrointestinal: Negative.    Genitourinary: Negative.    Musculoskeletal: Negative.    Skin: Negative.    Neurological: Negative.     Psychiatric/Behavioral: Negative.       Objective:   Visit Vitals  BP 134/66 (BP Patient Position: Sitting)   Pulse 70   Temp 97.6 ??F (36.4 ??C) (Temporal)   Resp 16   Ht 6\' 1"  (1.854 m)   Wt 182 lb (82.6 kg)   SpO2 99%   BMI 24.01 kg/m??       Vitals and Nurse Documentation reviewed.     Physical Exam  Constitutional:       Appearance: Normal appearance.   HENT:      Head: Normocephalic and atraumatic.      Right Ear: Tympanic membrane normal.      Left Ear: Tympanic membrane normal.      Nose: Nose normal.      Mouth/Throat:      Mouth: Mucous membranes are moist.   Cardiovascular:      Rate and Rhythm: Normal rate and regular rhythm.      Pulses: Normal pulses.      Heart sounds: Normal heart sounds.  Pulmonary:      Effort: Pulmonary effort is normal.      Breath sounds: Normal breath sounds.   Abdominal:      General: Bowel sounds are normal.      Palpations: Abdomen is soft.   Musculoskeletal:      Cervical back: Normal range of motion and neck supple.      Comments: Walking with walker   Neurological:      Mental Status: He is alert and oriented to person, place, and time.       Assessment/Plan:     Diagnoses and all orders for this visit:    1. Medicare annual wellness visit, subsequent    2. Acquired hypothyroidism    3. Pure hyperglyceridemia    4. Stage 3 chronic kidney disease, unspecified whether stage 3a or 3b CKD (HCC)    5. 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 Encounter for completion of form with patient          Pt expressed understanding with the diagnosis and plan        Discussed expected course/resolution/complications of diagnosis in detail with patient.    Medication risks/benefits/costs/interactions/alternatives discussed with patient.    Pt was given an after visit summary which includes diagnoses, current medications & vitals.  Pt expressed understanding with the diagnosis and plan    This is the Subsequent Medicare Annual Wellness Exam, performed 12  months or more after the Initial AWV or the last Subsequent AWV    I have reviewed the patient's medical history in detail and updated the computerized patient record.       Assessment/Plan   Education and counseling provided:  Planning to go to long term care facility    1. Medicare annual wellness visit, subsequent  2. Acquired hypothyroidism  -     TSH 3RD GENERATION; Future  -     T4, FREE; Future  3. Pure hyperglyceridemia  -     LIPID PANEL; Future  -     METABOLIC PANEL, COMPREHENSIVE; Future  4. Stage 3 chronic kidney disease, unspecified whether stage 3a or 3b CKD (HCC)  5. Pacemaker       Depression Risk Factor Screening     3 most recent PHQ Screens 08/09/2021   Little interest or pleasure in doing things Not at all   Feeling down, depressed, irritable, or hopeless Not at all   Total Score PHQ 2 0       Alcohol & Drug Abuse Risk Screen    Do you average more than 1 drink per night or more than 7 drinks a week: No    In the past three months have you have had more than 4 drinks containing alcohol on one occasion: No          Functional Ability and Level of Safety    Hearing: Hearing is good.      Activities of Daily Living:  The home contains: no safety equipment.  Patient needs help with:  transportation, shopping, preparing meals, laundry, housework, managing medications, dressing, and bathing      Ambulation: with difficulty, can't walk further than *uses walker     Fall Risk:  Fall Risk Assessment, last 12 mths 08/09/2021   Able to walk? Yes   Fall in past 12 months? 1   Do you feel unsteady? 1   Are you worried about falling 0  Is TUG test greater than 12 seconds? 0   Is the gait abnormal? 0   Number of falls in past 12 months 2   Fall with injury? 0      Abuse Screen:  Patient is not abused       Cognitive Screening    Has your family/caregiver stated any concerns about your memory: no     Cognitive Screening: Abnormal - Verbal Fluency Test    Health Maintenance Due     Health Maintenance Due   Topic  Date Due    Shingrix Vaccine Age 96> (1 of 2) Never done    COVID-19 Vaccine (4 - Booster for Pfizer series) 03/24/2021    Medicare Yearly Exam  06/14/2021       Patient Care Team   Patient Care Team:  Laurence Spates, NP as PCP - General (Family Medicine)  Laurence Spates, NP as PCP - [facility] Empaneled Provider  Thurston Pounds, MD (Cardiovascular Disease Physician)  [name], MD as Physician (Cardiovascular Disease Physician)    History     Patient Active Problem List   Diagnosis Code    Acute CVA (cerebrovascular accident) (HCC) I63.9    Tachycardia-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    Paroxysmal atrial fibrillation (HCC) I48.0    Pacemaker Z95.0    At high risk for falls Z91.81    Chronic renal disease, stage III N18.30     Past Medical History:   Diagnosis Date    Arrhythmia     Arthritis     Atrial fibrillation (HCC)     Chronic pain     RIGHT HIP    Dementia (HCC)     Hypercholesterolemia     Insomnia     Long term current use of anticoagulant therapy     Pacemaker     Stroke [facility])       Past Surgical History:   Procedure Laterality Date    HX PACEMAKER      PR ABDOMEN SURGERY PROC UNLISTED       Current Outpatient Medications   Medication Sig Dispense Refill    donepeziL (ARICEPT) 10 mg tablet Take 1 Tablet by mouth nightly. 90 Tablet 1    memantine ER (NAMENDA XR) 28 mg capsule Take 1 Capsule by mouth daily. 90 Capsule 1    atorvastatin (LIPITOR) 20 mg tablet TAKE 1 TABLET BY MOUTH EVERY DAY 90 Tablet 0    levothyroxine (SYNTHROID) 50 mcg tablet TAKE 1 TABLET BY MOUTH 30 MINUTES BEFORE BREAKFAST 90 Tablet 0    apixaban (Eliquis) 5 mg tablet TAKE 1 TABLET BY MOUTH TWICE A DAY(MUST MAKE APPT W/MD FOR FURTHER FILLS) 60 Tablet 3    fludrocortisone (FLORINEF) 0.1 mg tablet TAKE 1/2 TABLET BY MOUTH EVERY DAY 45 Tablet 7    diphenhydrAMINE (BENADRYL) 25 mg tablet Take 25 mg by mouth as needed for Allergies.       Allergies   Allergen Reactions    Flu Vaccine Qs2014-15(62mo,Up) Rash     States not an  allergy 11/23/19       Family History   Problem Relation Age of Onset    Cancer Mother         BREAST CA    Heart Disease Father     Heart Disease Brother      Social History     Tobacco Use    Smoking status: Every Day     Types: Cigars    Smokeless tobacco: Never  Tobacco comments:     occasional   Substance Use Topics    Alcohol use: Yes     Comment: occasionaly         Laurence Spates, NP

## 2021-08-20 NOTE — Telephone Encounter (Signed)
Called [facility] to check on Quantiferon-TB Gold test results. Results to be faxed within 15 mins.

## 2021-08-22 NOTE — Telephone Encounter (Signed)
Patient's nephew called checking status of the paperwork he left for provider to fill out for the patient to live in a assistant living.    Requesting a call back    Best call back [phone]#

## 2021-08-22 NOTE — Telephone Encounter (Signed)
Called nephew, Fabricio Endsley, to inform him that TB test was over incubated and the labs will have to be redrawn. Patient is scheduled for Tuesday at 10:15am to have blood drawn for quantiferon.

## 2021-08-23 ENCOUNTER — Encounter

## 2021-08-27 ENCOUNTER — Ambulatory Visit: Admit: 2021-08-27 | Payer: MEDICARE | Primary: Family

## 2021-08-31 LAB — QUANTIFERON-TB PLUS(CLIENT INCUB.)
QUANTIFERON PLUS: NEGATIVE
QUANTIFERON TB1 AG: 0.1 IU/mL
QUANTIFERON TB2 AG: 0.1 IU/mL
QuantiFERON Mitogen Value: >10 IU/mL
QuantiFERON Mitogen Value: >10 IU/mL
QuantiFERON Nil Value: 0.02 IU/mL
QuantiFERON Nil Value: 0.02 IU/mL
QuantiFERON Plus: NEGATIVE
QuantiFERON TB1 Ag: 0.1 IU/mL
QuantiFERON TB2 Ag: 0.1 IU/mL

## 2021-09-06 NOTE — Telephone Encounter (Signed)
[name] called and said the Pt is moving in a transitional home next week, and they need an observation and assessment order.     BCB# [phone]  FAX# [phone]

## 2021-09-10 ENCOUNTER — Encounter: Attending: Clinical Cardiac Electrophysiology | Primary: Family

## 2021-09-10 ENCOUNTER — Encounter: Primary: Family

## 2021-09-10 NOTE — Telephone Encounter (Signed)
[name] called back from [facility] to check on her letter that was sent in.  She noted that she needed an order for nursing and physical theapy, and would like a call back if she is able to get one.    Phone- [phone]  Fax- [phone]

## 2021-09-11 ENCOUNTER — Encounter

## 2021-09-11 NOTE — Telephone Encounter (Signed)
Called Elease Hashimoto regarding previous note. She states that patient needs a HH referral for eval for skilled nursing/PT. Referral is pended. Informed Elease Hashimoto that when provider signs referral, the referral will be faxed over.     Good fax #: [phone]

## 2021-09-11 NOTE — Telephone Encounter (Signed)
Referral was faxed and confirmed. Attempted to call to get another fax number. Could not leave voicemail, was not set up.

## 2021-09-11 NOTE — Telephone Encounter (Signed)
Someone called stating that they were on the phone earlier with [facility] nurse saying that she collected the patient PT and OT, but still has not received th Nursing NPT would like a call back or a fax of an NBT    Best call back number  [phone]

## 2021-09-13 NOTE — Telephone Encounter (Signed)
Victorino Dike from [employer] called in regards to referral they received.    BCB# [phone]

## 2021-09-13 NOTE — Telephone Encounter (Signed)
 Already heard back from [place] about this patient    Referral received, excepted, we have him on the schedule for this weekend. Thank you!    Marinda Fanning  Account Executive   [facility]  [phone]      Close Enc    Thanks  Powell RAMAN.  Referral Specialist  Jakie Mulligan [facility]

## 2021-09-17 ENCOUNTER — Encounter

## 2021-09-17 MED ORDER — LEVOTHYROXINE 50 MCG TAB
50 mcg | ORAL_TABLET | ORAL | 0 refills | Status: AC
Start: 2021-09-17 — End: ?

## 2021-09-17 MED ORDER — APIXABAN 5 MG TABLET
5 mg | ORAL_TABLET | ORAL | 3 refills | Status: AC
Start: 2021-09-17 — End: ?

## 2021-10-04 ENCOUNTER — Encounter: Payer: MEDICARE | Primary: Family

## 2021-10-04 ENCOUNTER — Encounter: Payer: MEDICARE | Attending: Nurse Practitioner | Primary: Family

## 2021-11-01 ENCOUNTER — Encounter: Attending: Family | Primary: Family

## 2021-11-18 ENCOUNTER — Encounter: Payer: MEDICARE | Primary: Family

## 2021-11-18 ENCOUNTER — Encounter: Payer: MEDICARE | Attending: Nurse Practitioner | Primary: Family

## 2021-12-11 ENCOUNTER — Encounter: Primary: Family

## 2021-12-16 ENCOUNTER — Encounter: Payer: MEDICARE | Primary: Family

## 2021-12-27 ENCOUNTER — Encounter: Attending: Neurology | Primary: Family

## 2022-01-10 ENCOUNTER — Encounter: Attending: Neurology | Primary: Family

## 2022-01-24 ENCOUNTER — Encounter: Payer: MEDICARE | Primary: Family

## 2022-01-24 ENCOUNTER — Encounter: Payer: MEDICARE | Attending: Nurse Practitioner | Primary: Family

## 2022-02-04 ENCOUNTER — Encounter: Attending: Neurology | Primary: Family

## 2022-05-05 ENCOUNTER — Encounter: Primary: Family

## 2022-10-27 NOTE — Telephone Encounter (Signed)
Patient nephew called requesting an appt with Dr. Raelene Bott would like callback    Nephew# [phone]

## 2022-10-28 NOTE — Telephone Encounter (Signed)
Attempted to reach patient. Left voicemail requesting return call.

## 2022-11-02 ENCOUNTER — Emergency Department: Payer: MEDICARE | Primary: Family

## 2022-11-02 ENCOUNTER — Inpatient Hospital Stay
Admission: EM | Admit: 2022-11-02 | Discharge: 2022-11-04 | Disposition: A | Discharge status: Hospice | Payer: Medicare Other | Source: Skilled Nursing Facility | Admitting: Family Medicine

## 2022-11-02 ENCOUNTER — Emergency Department: Admit: 2022-11-02 | Payer: MEDICARE | Primary: Family

## 2022-11-02 DIAGNOSIS — I619 Nontraumatic intracerebral hemorrhage, unspecified: Secondary | ICD-10-CM

## 2022-11-02 DIAGNOSIS — Z515 Encounter for palliative care: Secondary | ICD-10-CM

## 2022-11-02 LAB — CBC WITH AUTO DIFFERENTIAL
Absolute Immature Granulocyte: 0 10*3/uL (ref 0.00–0.04)
Basophils %: 1 % (ref 0–1)
Basophils Absolute: 0 10*3/uL (ref 0.0–0.1)
Eosinophils %: 5 % (ref 0–7)
Eosinophils Absolute: 0.3 10*3/uL (ref 0.0–0.4)
Hematocrit: 44.4 % (ref 36.6–50.3)
Hemoglobin: 15.1 g/dL (ref 12.1–17.0)
Immature Granulocytes: 0 % (ref 0.0–0.5)
Lymphocytes %: 31 % (ref 12–49)
Lymphocytes Absolute: 2.2 10*3/uL (ref 0.8–3.5)
MCH: 32.6 PG (ref 26.0–34.0)
MCHC: 34 g/dL (ref 30.0–36.5)
MCV: 95.9 FL (ref 80.0–99.0)
MPV: 11.4 FL (ref 8.9–12.9)
Monocytes %: 8 % (ref 5–13)
Monocytes Absolute: 0.6 10*3/uL (ref 0.0–1.0)
Neutrophils %: 55 % (ref 32–75)
Neutrophils Absolute: 3.8 10*3/uL (ref 1.8–8.0)
Nucleated RBCs: 0 PER 100 WBC
Platelets: 112 10*3/uL — ABNORMAL LOW (ref 150–400)
RBC: 4.63 M/uL (ref 4.10–5.70)
RDW: 14.4 % (ref 11.5–14.5)
WBC: 7 10*3/uL (ref 4.1–11.1)
nRBC: 0 10*3/uL (ref 0.00–0.01)

## 2022-11-02 LAB — POCT BLOOD GAS & ELECTROLYTES
Anion Gap, POC: 12 (ref 10–20)
Base Excess: 1.9 mmol/L
HCO3, Arterial: 25 mmol/L
PCO2, Venus, POC: 34 MMHG — ABNORMAL LOW (ref 41–51)
PH, VENOUS (POC): 7.47 — ABNORMAL HIGH (ref 7.32–7.42)
PO2, VENOUS (POC): 47 mmHg — ABNORMAL HIGH (ref 25–40)
POC Chloride: 109 MMOL/L — ABNORMAL HIGH (ref 100–108)
POC Creatinine: 0.8 MG/DL (ref 0.6–1.3)
POC Glucose: 108 MG/DL — ABNORMAL HIGH (ref 74–106)
POC Ionized Calcium: 1.13 mmol/L (ref 1.12–1.32)
POC Lactic Acid: 1.48 mmol/L (ref 0.40–2.00)
POC O2 SAT: 85 %
POC Potassium: 4.3 MMOL/L (ref 3.5–5.5)
POC Sodium: 146 MMOL/L — ABNORMAL HIGH (ref 136–145)
POC TCO2: 25 MMOL/L — ABNORMAL HIGH (ref 19–24)
eGFR, POC: >60 mL/min/{1.73_m2} (ref >60)

## 2022-11-02 LAB — MAGNESIUM: Magnesium: 2.2 mg/dL (ref 1.6–2.4)

## 2022-11-02 LAB — AMMONIA: Ammonia: 36 umol/L — ABNORMAL HIGH (ref <32)

## 2022-11-02 LAB — EXTRA TUBES HOLD

## 2022-11-02 NOTE — ED Notes (Signed)
Bedside and Verbal shift change report given to [place] (oncoming nurse) by Inocencio Homes (offgoing nurse). Report included the following information ED Encounter Summary, ED SBAR, MAR, and Recent Results.       Derinda Late, RN  11/02/22 1941

## 2022-11-02 NOTE — Progress Notes (Signed)
NIS Brief Plan of Care Note    Stroke Code paged overhead for Left Parietal Lobe ICH with mass effect and 6 mm or rightward midline shift. ICH Score 4. Family has decided on comfort care for patient.      Ruta Hinds, NP-BC

## 2022-11-02 NOTE — ED Triage Notes (Addendum)
Pt arrives from [facility] care via EMS with cc of AMS and irregular respirations.     EMS reports patient has only been responsive to pain which is not his baseline.EMS was bagging pt en route due ot irregular respirations    LKW "lunchtime".     Has hx of dementia and stroke.     BG 107

## 2022-11-02 NOTE — ED Provider Notes (Signed)
[facility] EMERGENCY DEP  EMERGENCY DEPARTMENT ENCOUNTER      Pt Name: [name]  MRN: [id number]  Birthdate 1932/09/07  Date of evaluation: 11/02/2022  Provider: Malissa Hippo, DO    CHIEF COMPLAINT       Chief Complaint   Patient presents with    Altered Mental Status       PMH   Past Medical History:   Diagnosis Date    Arrhythmia     Arthritis     Atrial fibrillation (HCC)     Chronic pain     RIGHT HIP    Dementia (HCC)     Hypercholesterolemia     Insomnia     Long term current use of anticoagulant therapy     Pacemaker     Stroke [facility])          MDM:   Vitals:    Vitals:    11/02/22 1830   BP: (!) 172/87   Pulse: 72   Resp: 17   Temp:    SpO2:            This is a 88 y.o. male with pmhx CVA, afib on eliquis, KD who presents today for cc of altered mental status.  Patient unable to history secondary to acuity of condition.  Per EMS the nursing home noted him to be in his bed and could not wake him up, he did not respond to painful stimuli.  He had intermittent apnea on transport and required bagging and nasal trumpet.    On arrival VS stable.   Physical Exam  General: Elderly, cachectic  HEENT: Normocephalic, atraumatic.  Eyes PERRLA 4 mm  Neck: ROM normal, supple  Cardio: Heart regular rate and rhythm, cap refill <2seconds  Lungs: Intermittent apnea periods of approximately 10 seconds followed by normal respirations, rhonchorous lung sounds bilaterally.  Abdomen: Soft, nondistended  MSK: No lower extremity edema, no injuries noted.  Skin: Warm, dry, no rash  Neuro: GCS [id number]     Patient evaluated and then sent to CT scan.  I did call and discuss patient's CODE STATUS with his POA, nephew, who confirmed that he is DNR.    CT shows massive intracranial hemorrhage.  Discussed with POA, per patient's prior wishes he would not like any invasive surgeries and at this time his family would like to make him comfort measures only.  Level One code stroke was called per protocol for intracranial bleed.     Patient  symptoms controlled with morphine and Ativan, will admit for expectant management.  CODE STATUS updated and chaplain paged.    Diagnosis is ICH, comfort measures. Disposition is Admission to medical floor. Workup and plan discussed with family , all questions answered and they are in agreement with the  plan .         Perfect [facility] Consult for Admission  9:36 PM    ED Room Number: ER08/08  Patient Name and age:  [name] 88 y.o.  male  Working Diagnosis:   1. Left-sided nontraumatic intracerebral hemorrhage, unspecified cerebral location (HCC)    2. Comfort measures only status        COVID-19 Suspicion: No  Sepsis present:  No  Reassessment needed: No  Code Status:  Do Not Resuscitate  Readmission: No  Isolation Requirements: no  Recommended Level of Care: med/surg  Department: [facility] Adult ED - [phone]              ED Course as  of 11/02/22 2136   Sun Nov 02, 2022   1827 EKG rate 72bpm, ventricular paced, no STEMI [MG]      ED Course User Index  [MG] Margarite Gouge, DO         Total critical care time (not including time spent performing separately reportable procedures): 45        Review of external notes and Independent historians utilized in decision making: Independent historian utilized due to [place] independently interpreted by me: EKG see ED course for interpretation    Discussions with other clinicians and healthcare agents:  Admitting team with plan to admit and Family for plan of care updates    Risks considered in patient's treatment plan: IV medications given (see MAR), Decision not to resuscitate due to poor prognosis, and Decision to de-escalate care due to poor prognosis         HISTORY OF PRESENT ILLNESS   (Location/Symptom, Timing/Onset, Context/Setting, Quality, Duration, Modifying Factors, Severity)  Note limiting factors.   See MDM    Nursing Notes were reviewed.    REVIEW OF SYSTEMS    (2-9 systems for level 4, 10 or more for level 5)   See MDM    PAST MEDICAL HISTORY      Past Medical History:   Diagnosis Date    Arrhythmia     Arthritis     Atrial fibrillation (HCC)     Chronic pain     RIGHT HIP    Dementia ([place])     Hypercholesterolemia     Insomnia     Long term current use of anticoagulant therapy     Pacemaker     Stroke [facility])        SURGICAL HISTORY       Past Surgical History:   Procedure Laterality Date    PACEMAKER      PR ABDOMEN SURGERY PROC UNLISTED         CURRENT MEDICATIONS       Previous Medications    APIXABAN (ELIQUIS) 5 MG TABS TABLET    TAKE 1 TABLET BY MOUTH TWICE A DAY(MUST MAKE APPT WITH MD FOR FURTHER FILLS)    ATORVASTATIN (LIPITOR) 20 MG TABLET    TAKE 1 TABLET BY MOUTH EVERY DAY    DIPHENHYDRAMINE ([place]) 25 MG TABLET    Take 25 mg by mouth as needed    DONEPEZIL (ARICEPT) 10 MG TABLET    Take 10 mg by mouth    FLUDROCORTISONE (FLORINEF) 0.1 MG TABLET    TAKE 1/2 TABLET BY MOUTH EVERY DAY    LEVOTHYROXINE (SYNTHROID) 50 MCG TABLET    TAKE 1 TABLET BY MOUTH 30 MINUTES BEFORE BREAKFAST    MEMANTINE ER (NAMENDA XR) 28 MG CP24 EXTENDED RELEASE CAPSULE    Take 28 mg by mouth daily       ALLERGIES     Fluarix quadrivalent [influenza vac split quad]    FAMILY HISTORY       Family History   Problem Relation Age of Onset    Heart Disease Brother     Heart Disease Father     Cancer Mother         BREAST CA          SOCIAL HISTORY       Social History     Socioeconomic History    Marital status: Widowed   Tobacco Use    Smoking status: Every Day    Smokeless tobacco:  Never   Substance and Sexual Activity    Alcohol use: Yes    Drug use: No       PHYSICAL EXAM    (up to 7 for level 4, 8 or more for level 5)     ED Triage Vitals [11/02/22 1815]   BP Temp Temp Source Pulse Respirations SpO2 Height Weight   (!) 172/87 98 F (36.7 C) Axillary (!) 102 10 100 % -- --       There is no height or weight on file to calculate BMI.    Physical Exam    See mdm    DIAGNOSTIC RESULTS     EKG: All EKG's are interpreted by the Emergency Department Physician who either signs or  Co-signs this chart in the absence of a cardiologist.        RADIOLOGY:   Non-plain film images such as CT, Ultrasound and MRI are read by the radiologist.    Interpretation per the Radiologist below, if available at the time of this note:    CT HEAD WO CONTRAST   Final Result   1. Large acute parenchymal hematoma in the left parietal lobe, with mass effect   and 6 mm of rightward midline shift.   2. Chronic right temporal lobe infarction.   3. Periventricular white matter disease is likely secondary to chronic small   vessel ischemic changes.      The findings were communicated to Dr. Vassie Loll by perfect serve on 11/02/2022 at   7:14 PM by Dr. Michaelle Birks.  789               LABS:  Labs Reviewed   COMPREHENSIVE METABOLIC PANEL - Abnormal; Notable for the following components:       Result Value    Chloride 109 (*)     Glucose 110 (*)     Total Bilirubin 1.4 (*)     All other components within normal limits   CBC WITH AUTO DIFFERENTIAL - Abnormal; Notable for the following components:    Platelets 112 (*)     All other components within normal limits   AMMONIA - Abnormal; Notable for the following components:    Ammonia 36 (*)     All other components within normal limits   POCT BLOOD GAS & ELECTROLYTES - Abnormal; Notable for the following components:    PH, VENOUS (POC) 7.47 (*)     PCO2, Venus, POC 34.0 (*)     PO2, VENOUS (POC) 47 (*)     POC Sodium 146 (*)     POC Chloride 109 (*)     POC TCO2 25 (*)     POC Glucose 108 (*)     All other components within normal limits   EXTRA TUBES HOLD   MAGNESIUM   ETHANOL   URINALYSIS WITH MICROSCOPIC   EXTRA TUBE, BLOOD BANK   VENOUS BLOOD GAS, POINT OF CARE   POCT LACTIC ACID       All other labs were within normal range or not returned as of this dictation.        PROCEDURES:  Unless otherwise noted below, none     Procedures    See MDM for documentation of critical care time.       FINAL IMPRESSION      1. Left-sided nontraumatic intracerebral hemorrhage, unspecified  cerebral location (HCC)    2. Comfort measures only status          DISPOSITION/PLAN  DISPOSITION        PATIENT REFERRED TO:  No follow-up provider specified.    DISCHARGE MEDICATIONS:  New Prescriptions    No medications on file         (Please note that portions of this note were completed with a voice recognition program.  Efforts were made to edit the dictations but occasionally words are mis-transcribed.)    Malissa Hippo, DO (electronically signed)  Emergency Attending Physician / Physician Assistant / Nurse Practitioner             Malissa Hippo, DO  11/02/22 2136

## 2022-11-03 LAB — COMPREHENSIVE METABOLIC PANEL
ALT: 15 U/L (ref 12–78)
AST: 23 U/L (ref 15–37)
Albumin/Globulin Ratio: 1.2 (ref 1.1–2.2)
Albumin: 3.7 g/dL (ref 3.5–5.0)
Alk Phosphatase: 98 U/L (ref 45–117)
Anion Gap: 6 mmol/L (ref 5–15)
BUN: 13 MG/DL (ref 6–20)
Bun/Cre Ratio: 13 (ref 12–20)
CO2: 25 mmol/L (ref 21–32)
Calcium: 9.1 MG/DL (ref 8.5–10.1)
Chloride: 109 mmol/L — ABNORMAL HIGH (ref 97–108)
Creatinine: 0.98 MG/DL (ref 0.70–1.30)
Est, Glom Filt Rate: >60 mL/min/{1.73_m2} (ref >60)
Globulin: 3 g/dL (ref 2.0–4.0)
Glucose: 110 mg/dL — ABNORMAL HIGH (ref 65–100)
Potassium: 4.3 mmol/L (ref 3.5–5.1)
Sodium: 140 mmol/L (ref 136–145)
Total Bilirubin: 1.4 MG/DL — ABNORMAL HIGH (ref 0.2–1.0)
Total Protein: 6.7 g/dL (ref 6.4–8.2)

## 2022-11-03 LAB — ETHANOL: Ethanol Lvl: <10 MG/DL (ref <10)

## 2022-11-03 MED ORDER — MORPHINE SULFATE (PF) 4 MG/ML IV SOLN
4 MG/ML | INTRAVENOUS | Status: DC | PRN
Start: 2022-11-03 — End: 2022-11-04

## 2022-11-03 MED ORDER — LORAZEPAM 2 MG/ML IJ SOLN
2 MG/ML | Freq: Once | INTRAMUSCULAR | Status: AC
Start: 2022-11-03 — End: 2022-11-02
  Administered 2022-11-03: 1 mg via INTRAVENOUS

## 2022-11-03 MED ORDER — LORAZEPAM 2 MG/ML IJ SOLN
2 MG/ML | Freq: Four times a day (QID) | INTRAMUSCULAR | Status: DC | PRN
Start: 2022-11-03 — End: 2022-11-04

## 2022-11-03 MED ORDER — MORPHINE SULFATE (PF) 4 MG/ML IV SOLN
4 MG/ML | Freq: Once | INTRAVENOUS | Status: AC
Start: 2022-11-03 — End: 2022-11-02
  Administered 2022-11-03: 4 mg via INTRAVENOUS

## 2022-11-03 MED ORDER — MORPHINE SULFATE 10 MG/ML IV SOLN
10 MG/ML | INTRAVENOUS | Status: DC
Start: 2022-11-03 — End: 2022-11-03

## 2022-11-03 MED ORDER — MORPHINE SULFATE (PF) 2 MG/ML IV SOLN
2 MG/ML | INTRAVENOUS | Status: DC | PRN
Start: 2022-11-03 — End: 2022-11-04
  Administered 2022-11-03 (×2): 2 mg via INTRAVENOUS

## 2022-11-03 MED ORDER — LORAZEPAM 2 MG/ML IJ SOLN
2 MG/ML | INTRAMUSCULAR | Status: DC | PRN
Start: 2022-11-03 — End: 2022-11-03
  Administered 2022-11-03 (×2): 1 mg via INTRAVENOUS

## 2022-11-03 MED ORDER — ONDANSETRON HCL 4 MG/2ML IJ SOLN
4 MG/2ML | Freq: Four times a day (QID) | INTRAMUSCULAR | Status: DC | PRN
Start: 2022-11-03 — End: 2022-11-04

## 2022-11-03 MED ORDER — ACETAMINOPHEN 650 MG RE SUPP
650 MG | RECTAL | Status: DC | PRN
Start: 2022-11-03 — End: 2022-11-04

## 2022-11-03 MED ORDER — SODIUM CHLORIDE 0.9 % IV SOLN
0.9 % | INTRAVENOUS | Status: DC
Start: 2022-11-03 — End: 2022-11-03

## 2022-11-03 MED ORDER — SCOPOLAMINE 1 MG/3DAYS TD PT72
1 MG/3DAYS | TRANSDERMAL | Status: DC
Start: 2022-11-03 — End: 2022-11-04
  Administered 2022-11-03: 09:00:00 1 via TRANSDERMAL

## 2022-11-03 MED ORDER — MORPHINE SULFATE (PF) 4 MG/ML IV SOLN
4 MG/ML | INTRAVENOUS | Status: AC | PRN
Start: 2022-11-03 — End: 2022-11-03
  Administered 2022-11-03 (×2): 4 mg via INTRAVENOUS

## 2022-11-03 MED FILL — MORPHINE SULFATE 50 MG/ML IV SOLN: 50 MG/ML | INTRAVENOUS | Qty: 2

## 2022-11-03 MED FILL — MORPHINE SULFATE 2 MG/ML IJ SOLN: 2 mg/mL | INTRAMUSCULAR | Qty: 1

## 2022-11-03 MED FILL — MORPHINE SULFATE 4 MG/ML IV SOLN: 4 mg/mL | INTRAVENOUS | Qty: 1

## 2022-11-03 MED FILL — SCOPOLAMINE 1 MG/3DAYS TD PT72: 1 MG/3DAYS | TRANSDERMAL | Qty: 1

## 2022-11-03 MED FILL — LORAZEPAM 2 MG/ML IJ SOLN: 2 MG/ML | INTRAMUSCULAR | Qty: 1

## 2022-11-03 MED FILL — MORPHINE SULFATE 10 MG/ML IV SOLN: 10 MG/ML | INTRAVENOUS | Qty: 10

## 2022-11-03 NOTE — Progress Notes (Addendum)
[place] [facility]  [phone]     Patient Name: [name]  Date of Birth: 10/29/1932  Age: 88 y.o.    [place] RN Note:  Hospice consult received, reviewing chart. Will follow up with Unit Nurse and Care Manager to discuss plan of care, patient status and discharge disposition within the hour.     [facility] has verified availability to provide hospice services for this patient and his family at his home address:   [place] [phone]    Thank you for the opportunity to be of service to this patient.     12:13: Spoke to Marnee Spring, patient's sister-in-law and a Co-MPOA on pt's ACP documentation. Second person on ACP documents is Steele Sizer who is patient's neighbor. Audrea Muscat states she lives in [place].  Velta Addison states that pt's nephew, Saron Tweed is bedside with [employer] son, Dutch Quint.    PLAN: Hospice team to visit patient bedside today, and call Velta Addison to review Hospice plan of care.    Will review patient with Dr. Marlaine Hind, Hospice medical director for hospice plan of care.    12:50: Bedside. Pt nephew, Kona Lover is bedside. Patient appears to be labored breathing. He does not respond.  [name] states his hesitation to enroll pt into hospice services, as he feels pt is actively dying and is being adequately medicated under comfort care.    Plan to call pt MPOA back and review hospice over the phone.    14:30: Phone call made back to patient's Co-MPOA, Lewanda Rife. Velta Addison states that she in interested in Hospice, however, does not want pt to have to move locations. Educated MPOA, Hospice services would begin at [facility] and likely pt would remain at [facility] until his death.    Maryann may not be able to come to [facility]. Reviewed second MPOA on ACP, MarySue Sanderlin who is patient's neighbor in [place]. Maryann said that [facility] has not been updated as to patient's status or that he is in the hospital.  Velta Addison plans to contact [facility] this evening, and would like to continue with Hospice discussion in the am, 11/21.     PLAN: Phone call to MPOA in the am to plan for [facility] inpatient hospice services.      Jobe Igo, RN, [place] Nurse Liaison  [phone] Mobile  [phone] Office  Available on Perfect Serve

## 2022-11-03 NOTE — Care Coordination-Inpatient (Signed)
CM noted that this pt has an order for hospice. CM sent a referral to [facility] per protocol. Bess Saltzman,BSW,ACM-SW

## 2022-11-03 NOTE — Consults (Signed)
Palliative    See from chart review that goals are clear for comfort measures only.  Hospice has been consulted.  Will hold off on palliative consult for now   Please call as needed [phone]

## 2022-11-03 NOTE — H&P (Signed)
[place]  HISTORY AND PHYSICAL    Name:  IZZY, COURVILLE  MR#:  [id number]  DOB:  May 01, 1932  ACCOUNT #:  [id number]  ADMIT DATE:  11/02/2022      The patient was seen, evaluated, and admitted by me on 11/02/2022.    PRIMARY CARE PROVIDER:  Sonnie Alamo, NP    SOURCE OF INFORMATION:  The patient's niece who was present at the bedside and review of ED electronic medical records.    CHIEF COMPLAINT:  Change in mental status.    HISTORY OF PRESENT ILLNESS:  This is an 88 year old man with a past medical history significant for dementia, paroxysmal atrial fibrillation on Eliquis, status post pacemaker insertion, dyslipidemia, and hypothyroidism, presented at the emergency room with change in mental status.  The patient resides at the memory unit at the assisted living facility.  The patient was noted to be in his bed and could not wake up.  The patient did not respond to painful stimuli.  EMS was called.  When the EMS arrived at the scene, it was reported that the patient has intermittent apnea and the patient was brought to the emergency room for further evaluation.  When the patient arrived at the emergency room, CT scan of the head shows large acute parenchymal hematoma in the left parietal lobe with mass effect.  The patient is not able to provide history.  History was obtained from the patient's niece, who was present at the bedside.  The niece was also the power of attorney.  The goal of care was discussed with the niece by the emergency room physician.  The patient's niece did not want any heroic measures and the patient has an active DNR status, which went into reset.  The patient was made comfort measures only status following the discussion between the emergency room physician and the patient's niece.  The patient received morphine in the emergency room and the patient was in the emergency room for couple of hours with expectation that the patient was going to expire.  When the patient  did not expire despite being on comfort measures only in the emergency room, the patient was referred to the hospitalist service for admission.  I had further discussion with the patient's niece at the bedside about the comfort measures only and his status categorized as that is the treatment goal that is wanted for the patient.  The patient is being admitted to the hospital for continuation of comfort measures only status started in the emergency room.  There was no history of fall at the nursing home reported.  It was just stated that the patient was found on the bed and he was not responsive.    PAST MEDICAL HISTORY:  1.  Dementia.  2.  Paroxysmal atrial fibrillation, on Eliquis for anticoagulation.  3.  Status post pacemaker insertion.  4.  Dyslipidemia.  5.  Hypothyroidism.    PAST SURGICAL HISTORY:  This is significant for abdominal surgery and pacemaker insertion.    ALLERGIES:  THE PATIENT IS ALLERGIC TO INFLUENZA VACCINE.    CURRENT MEDICATIONS:  1.  Eliquis 5 mg twice daily.  2.  Lipitor 20 mg daily.  3.  Aricept 10 mg daily.  4.  Florinef 0.1 mg half tablet daily.  5.  Synthroid 50 mcg daily.  6.  Namenda 28 mg daily.    SOCIAL HISTORY:  No history of alcohol or tobacco abuse reported.    FAMILY HISTORY:  This  was reviewed.  His mother had breast cancer.  His father had heart disease.    REVIEW OF SYSTEMS:  Unable to obtain because the patient's change in mental status.    PHYSICAL EXAMINATION:  GENERAL APPEARANCE:  The patient appeared ill, in critical condition.  VITAL SIGNS:  On arrival at the emergency room, temperature 98, respiratory rate 10, pulse 101, blood pressure 172/87, and oxygen saturation 100%.  HEENT:  Head:  Normocephalic and atraumatic.  Eyes:  Unable to assess eye movement, but no redness, no drainage, no discharge.  Ears:  Normal external ears with no evidence of drainage.  Nose:  No deformity and no drainage.  Mouth and Throat:  No visible oral lesions.  NECK:  Neck is supple.  No  JVD.  No thyromegaly.  CHEST:  Few expiratory wheezing and crackles.  HEART:  Normal S1 and S2.  Irregularly irregular.  No clinically appreciable murmur.  ABDOMEN:  Soft and nontender.  Normal bowel sounds.  CNS:  Lethargic and not arousable.  EXTREMITIES:  No edema.  Pulses are 2+ bilaterally.  MUSCULOSKELETAL SYSTEM:  No obvious joint deformity or swelling.  SKIN:  No active skin lesions seen in the exposed part of the body.  PSYCHIATRY:  Unable to assess affect and mood.  LYMPHATIC SYSTEM:  No cervical lymphadenopathy.    DIAGNOSTIC DATA:  CT scan of the head without contrast shows large acute parenchymal hematoma in the left parietal lobe with mass effect.    ASSESSMENT:  1.  Left-sided nontraumatic intracerebral hemorrhage.  2.  Dementia.  3.  Paroxysmal atrial fibrillation, on Eliquis for anticoagulation.  4.  Status post pacemaker insertion.  5.  Dyslipidemia.  6.  Hypothyroidism.  7.  Comfort measures only status.    PLAN:  Comfort measures only status:  This patient sustained a left-sided nontraumatic intracerebral hemorrhage.  The patient's niece who was the power of attorney update for comfort measures only status.  This was started in the emergency room because the patient did not expire after a couple of hours in the emergency room, and the patient was referred to the hospitalist service for admission.  The patient was admitted using the comfort care order set.  The patient was placed on morphine drip as well as medication to help with secretion.  The patient was transferred to the medical floor, later received a phone call from the nursing supervisor stating that the floor where the patient is presently located cannot handle titrated morphine infusion.  Because of that, the morphine infusion was discontinued.  The patient was then placed on as needed morphine for comfort care.  Hospice consult was requested as part of the comfort care measures only status.      Noralyn Pick,  MD      [id number]  D:  11/03/2022 4:50  T:  11/03/2022 5:45  JOB #:  [id number]  CC:  Matthias Hughs, NP

## 2022-11-03 NOTE — Plan of Care (Signed)
Problem: Safety - Adult  Goal: Free from fall injury  Recent Flowsheet Documentation  Taken 11/03/2022 0830 by Derrill Center, LPN  Free From Fall Injury:   Based on caregiver fall risk screen, instruct family/caregiver to ask for assistance with transferring patient who is noted to have fall risk factors   Instruct family/caregiver on patient safety

## 2022-11-03 NOTE — Progress Notes (Signed)
[facility]                                                                                          Hospitalist Progress Note  Richelle Ito, [place]  Answering service: [phone] OR [phone] from in house phone        Date of Service:  11/03/2022  NAME:  [name]  DOB:  [birth year]  MRN:  [id number]       Admission Summary:    Per H&P:     "This is an 88 year old man with a past medical history significant for dementia, paroxysmal atrial fibrillation on Eliquis, status post pacemaker insertion, dyslipidemia, and hypothyroidism, presented at the emergency room with change in mental status.  The patient resides at the memory unit at the assisted living facility.  The patient was noted to be in his bed and could not wake up.  The patient did not respond to painful stimuli.  EMS was called.  When the EMS arrived at the scene, it was reported that the patient has intermittent apnea and the patient was brought to the emergency room for further evaluation.  When the patient arrived at the emergency room, CT scan of the head shows large acute parenchymal hematoma in the left parietal lobe with mass effect.  The patient is not able to provide history.  History was obtained from the patient's niece, who was present at the bedside.  The niece was also the power of attorney.  The goal of care was discussed with the niece by the emergency room physician.  The patient's niece did not want any heroic measures and the patient has an active DNR status, which went into reset.  The patient was made comfort measures only status following the discussion between the emergency room physician and the patient's niece.  The patient received morphine in the emergency room and the patient was in the emergency room for couple of hours with expectation that the patient was going to expire.  When the patient did not expire despite being on comfort measures only in the emergency room, the patient was  referred to the hospitalist service for admission.  I had further discussion with the patient's niece at the bedside about the comfort measures only and his status categorized as that is the treatment goal that is wanted for the patient.  The patient is being admitted to the hospital for continuation of comfort measures only status started in the emergency room.  There was no history of fall at the nursing home reported.  It was just stated that the patient was found on the bed and he was not responsive."       Interval history / Subjective:     Pt laying in bed. Demonstrating intermittent apnea. Nonverbal, not following commands. Discussion with nephew at bedside. Family would like comfort measures only and are interested in admission to inpatient hospice. Hospice nurse working on getting in contact with MPOA.      Assessment & Plan:        Left-sided nontraumatic intracerebral hemorrhage.  - per CT  - discussion  with family and consult hospice team - likely dc to inpatient hospice tomorrow morning     Dementia.  Paroxysmal atrial fibrillation, on Eliquis for anticoagulation.  Status post pacemaker insertion.  Dyslipidemia.  Hypothyroidism.  - Comfort measures only        Code status: DNR  Prophylaxis: N/a  Care Plan discussed with: Hospice nurse, pt family   Anticipated Disposition: Tomorrow morning  Inpatient  Cardiac monitoring: None     Principal Problem:    Comfort measures only status  Resolved Problems:    * No resolved hospital problems. *         Social Determinants of Health     Tobacco Use: High Risk (09/13/2021)    Patient History     Smoking Tobacco Use: Every Day     Smokeless Tobacco Use: Never     Passive Exposure: Not on file   Alcohol Use: Not on file   Financial Resource Strain: Not on file   Food Insecurity: Not on file   Transportation Needs: Not on file   Physical Activity: Not on file   Stress: Not on file   Social Connections: Not on file   Intimate Partner Violence: Not on file   Depression:  Not at risk (08/09/2021)    PHQ-2     PHQ-2 Score: 0   Housing Stability: Not on file   Interpersonal Safety: Not on file   Utilities: Not on file       Review of Systems:   Limited due to pt's acute condition       Vital Signs:    Last 24hrs VS reviewed since prior progress note. Most recent are:  Vitals:    11/03/22 1515   BP: (!) 149/61   Pulse: 61   Resp: 18   Temp: 98.4 F (36.9 C)   SpO2:        No intake or output data in the 24 hours ending 11/03/22 1537     Physical Examination:     I had a face to face encounter with this patient and independently examined them on 11/03/2022 as outlined below:          General : elderly, cachectic, nonverbal, not following commands.  HEENT: PEERL, EOMI, moist mucus membrane  Neck: supple, no JVD, no meningeal signs  Chest: intermittent apnea periods, rhonchorous lung sounds BL  CVS: S1 S2 heard, Capillary refill less than 2 seconds  Abd: soft/ nondistended, BS physiological  Ext: no clubbing, no cyanosis, no edema, brisk 2+ DP pulses  Neuro/Psych: GCS E4V1M4  Skin: warm     Data Review:    Review and/or order of clinical lab test      I have personally and independently reviewed all pertinent labs, diagnostic studies, imaging, and have provided independent interpretation of the same.     Labs:     Recent Labs     11/02/22  1822   WBC 7.0   HGB 15.1   HCT 44.4   PLT 112*     Recent Labs     11/02/22  1822   NA 140   K 4.3   CL 109*   CO2 25   BUN 13   MG 2.2     Recent Labs     11/02/22  1822   ALT 15   GLOB 3.0     No results for input(s): "INR", "APTT" in the last 72 hours.    Invalid input(s): "PTP"   No results  for input(s): "TIBC", "FERR" in the last 72 hours.    Invalid input(s): "FE", "PSAT"   No results found for: "FOL", "RBCF"   No results for input(s): "PH", "PCO2", "PO2" in the last 72 hours.  No results for input(s): "CPK" in the last 72 hours.    Invalid input(s): "CPKMB", "CKNDX", "TROIQ"  Lab Results   Component Value Date/Time    CHOL 116 05/01/2021 10:50 AM     HDL 49 05/01/2021 10:50 AM     No results found for: "[place]"  @LABUA @    Notes reviewed from all clinical/nonclinical/nursing services involved in patient's clinical care. Care coordination discussions were held with appropriate clinical/nonclinical/ nursing providers based on care coordination needs.         Patients current active Medications were reviewed, considered, added and adjusted based on the clinical condition today.            Medications Reviewed:     Current Facility-Administered Medications   Medication Dose Route Frequency    scopolamine (TRANSDERM-SCOP) transdermal patch 1 patch  1 patch TransDERmal Q72H    acetaminophen (TYLENOL) suppository 650 mg  650 mg Rectal Q4H PRN    ondansetron (ZOFRAN) injection 4 mg  4 mg IntraVENous Q6H PRN    LORazepam (ATIVAN) injection 1 mg  1 mg IntraVENous Q6H PRN    morphine (PF) injection 2 mg  2 mg IntraVENous Q2H PRN    Or    morphine (PF) injection 4 mg  4 mg IntraVENous Q2H PRN     ______________________________________________________________________  EXPECTED LENGTH OF STAY: Unable to retrieve estimated LOS  ACTUAL LENGTH OF STAY:          1                 Ruthy Dick, PA-C

## 2022-11-03 NOTE — Progress Notes (Signed)
Chaplain initiated visit to [name] for a palliative care spiritual assessment. Unable to assess because it appears patient was resting.  Patient did not rouse when this chaplain called his name.    For additional spiritual care, please contact the chaplain on-call at [phone]).    Rev. Tarri Fuller, MDiv  Resident Chaplain

## 2022-11-04 ENCOUNTER — Inpatient Hospital Stay
Admit: 2022-11-04 | Discharge: 2022-11-14 | Disposition: E | Payer: PRIVATE HEALTH INSURANCE | Source: Other Acute Inpatient Hospital | Attending: Family Medicine | Admitting: Family Medicine

## 2022-11-04 DIAGNOSIS — I613 Nontraumatic intracerebral hemorrhage in brain stem: Secondary | ICD-10-CM

## 2022-11-04 DIAGNOSIS — Z515 Encounter for palliative care: Secondary | ICD-10-CM

## 2022-11-04 LAB — EKG 12-LEAD
Atrial Rate: 71 {beats}/min
Q-T Interval: 452 ms
QRS Duration: 160 ms
QTc Calculation (Bazett): 494 ms
R Axis: 254 degrees
T Axis: 78 degrees
Ventricular Rate: 72 {beats}/min

## 2022-11-04 MED ORDER — LORAZEPAM 2 MG/ML IJ SOLN
2 MG/ML | INTRAMUSCULAR | Status: DC
Start: 2022-11-04 — End: 2022-11-07
  Administered 2022-11-05 – 2022-11-07 (×16): 2 mg via INTRAVENOUS

## 2022-11-04 MED ORDER — BISACODYL 10 MG RE SUPP
10 MG | Freq: Every day | RECTAL | Status: DC | PRN
Start: 2022-11-04 — End: 2022-11-07
  Administered 2022-11-07: 14:00:00 10 mg via RECTAL

## 2022-11-04 MED ORDER — GLYCOPYRROLATE 0.2 MG/ML IJ SOLN
0.2 MG/ML | INTRAMUSCULAR | Status: AC | PRN
Start: 2022-11-04 — End: 2022-11-07
  Administered 2022-11-07: 15:00:00 0.2 mg via INTRAVENOUS

## 2022-11-04 MED ORDER — ONDANSETRON HCL 4 MG/2ML IJ SOLN
4 MG/2ML | Freq: Four times a day (QID) | INTRAMUSCULAR | Status: DC | PRN
Start: 2022-11-04 — End: 2022-11-07

## 2022-11-04 MED ORDER — LORAZEPAM 2 MG/ML IJ SOLN
2 MG/ML | INTRAMUSCULAR | Status: DC | PRN
Start: 2022-11-04 — End: 2022-11-07

## 2022-11-04 MED ORDER — KETOROLAC TROMETHAMINE 30 MG/ML IJ SOLN
30 MG/ML | Freq: Four times a day (QID) | INTRAMUSCULAR | Status: DC
Start: 2022-11-04 — End: 2022-11-04

## 2022-11-04 MED ORDER — MORPHINE SULFATE (PF) 2 MG/ML IV SOLN
2 MG/ML | INTRAVENOUS | Status: DC
Start: 2022-11-04 — End: 2022-11-07
  Administered 2022-11-05 – 2022-11-07 (×16): 2 mg via INTRAVENOUS

## 2022-11-04 MED ORDER — MORPHINE SULFATE (PF) 2 MG/ML IV SOLN
2 MG/ML | INTRAVENOUS | Status: DC | PRN
Start: 2022-11-04 — End: 2022-11-04

## 2022-11-04 MED ORDER — NORMAL SALINE FLUSH 0.9 % IV SOLN
0.9 % | INTRAVENOUS | Status: AC | PRN
Start: 2022-11-04 — End: 2022-11-07
  Administered 2022-11-05 – 2022-11-06 (×2): 10 mL via INTRAVENOUS

## 2022-11-04 MED ORDER — MORPHINE SULFATE (PF) 2 MG/ML IV SOLN
2 MG/ML | INTRAVENOUS | Status: AC | PRN
Start: 2022-11-04 — End: 2022-11-07
  Administered 2022-11-07: 15:00:00 2 mg via INTRAVENOUS

## 2022-11-04 NOTE — Plan of Care (Signed)
Problem: Skin/Tissue Integrity  Goal: Absence of new skin breakdown  Description: 1.  Monitor for areas of redness and/or skin breakdown  2.  Assess vascular access sites hourly  3.  Every 4-6 hours minimum:  Change oxygen saturation probe site  4.  Every 4-6 hours:  If on nasal continuous positive airway pressure, respiratory therapy assess nares and determine need for appliance change or resting period.  11/03/2022 2044 by Derrill Center, LPN  Outcome: Progressing     Problem: Pain  Goal: Verbalizes/displays adequate comfort level or baseline comfort level  Flowsheets (Taken November 17, 2022 0604)  Verbalizes/displays adequate comfort level or baseline comfort level:   Encourage patient to monitor pain and request assistance   Assess pain using appropriate pain scale   Administer analgesics based on type and severity of pain and evaluate response   Implement non-pharmacological measures as appropriate and evaluate response   Consider cultural and social influences on pain and pain management   Notify Licensed Independent Practitioner if interventions unsuccessful or patient reports new pain

## 2022-11-04 NOTE — Progress Notes (Addendum)
Hospice Liaison Note:    Chart reviewed for updates in plan of care.  Spoke with [name] over phone. [name] states that she has not been able to reach [name] who is listed as a second Primary/Second MPOA. [name] is unable to come to hospital to complete Hospice consents.    [facility] requests Hospice service begin today at [facility]. She requests Hospice consents be sent to her via [employer]. Her email address  [email or web address]    [name] does not have access to computer until noon today.      Please call Hospice team to offer support for patient, family or staff.    Thank you for your coordination with the hospice plan of care      Danny Igo, RN, [place] Nurse Liaison  [phone] Mobile  [phone] Office       Consents sent to [name] via docusign by hospice liaison, [name] at 12:05. Spoke to [place] who will call me once she has her computer open to assist with e-consents.

## 2022-11-04 NOTE — Discharge Summary (Signed)
Discharge Summary       PATIENT ID: NICOLAE VASEK  MRN: [id number]   DATE OF BIRTH: 09/27/1932    DATE OF ADMISSION: 11/02/2022  6:14 PM    DATE OF DISCHARGE: 11-28-2022   PRIMARY CARE PROVIDER: Matthias Hughs, APRN - NP     ATTENDING PHYSICIAN: Dalbert Garnet MD  DISCHARGING PROVIDER: Richelle Ito, PA-C    To contact this individual call [phone] and ask the operator to page.  If unavailable ask to be transferred the Adult Hospitalist Department.    CONSULTATIONS: IP CONSULT TO PALLIATIVE CARE  IP CONSULT TO HOSPICE    PROCEDURES/SURGERIES: * No surgery found *     ADMITTING DIAGNOSES & HOSPITAL COURSE:      Per H&P:      "This is an 88 year old man with a past medical history significant for dementia, paroxysmal atrial fibrillation on Eliquis, status post pacemaker insertion, dyslipidemia, and hypothyroidism, presented at the emergency room with change in mental status.  The patient resides at the memory unit at the assisted living facility.  The patient was noted to be in his bed and could not wake up.  The patient did not respond to painful stimuli.  EMS was called.  When the EMS arrived at the scene, it was reported that the patient has intermittent apnea and the patient was brought to the emergency room for further evaluation.  When the patient arrived at the emergency room, CT scan of the head shows large acute parenchymal hematoma in the left parietal lobe with mass effect.  The patient is not able to provide history.  History was obtained from the patient's niece, who was present at the bedside.  The niece was also the power of attorney.  The goal of care was discussed with the niece by the emergency room physician.  The patient's niece did not want any heroic measures and the patient has an active DNR status, which went into reset.  The patient was made comfort measures only status following the discussion between the emergency room physician and the patient's niece.  The patient received morphine in  the emergency room and the patient was in the emergency room for couple of hours with expectation that the patient was going to expire.  When the patient did not expire despite being on comfort measures only in the emergency room, the patient was referred to the hospitalist service for admission.  I had further discussion with the patient's niece at the bedside about the comfort measures only and his status categorized as that is the treatment goal that is wanted for the patient.  The patient is being admitted to the hospital for continuation of comfort measures only status started in the emergency room.  There was no history of fall at the nursing home reported.  It was just stated that the patient was found on the bed and he was not responsive."    Admitted to Inpatient Hospice 11/21        DISCHARGE DIAGNOSES / PLAN:      Left-sided nontraumatic intracerebral hemorrhage.  - Dc to inpatient hospice 11/21     Dementia.  Paroxysmal atrial fibrillation, on Eliquis for anticoagulation.  Status post pacemaker insertion.  Dyslipidemia.  Hypothyroidism.  - Comfort measures only        PENDING TEST RESULTS:   At the time of discharge the following test results are still pending: N/a    FOLLOW UP APPOINTMENTS:    Follow-up Information  None             DISCHARGE MEDICATIONS:     Medication List        ASK your doctor about these medications      apixaban 5 MG Tabs tablet  Commonly known as: ELIQUIS     atorvastatin 20 MG tablet  Commonly known as: LIPITOR     diphenhydrAMINE 25 MG tablet  Commonly known as: SOMINEX     donepezil 10 MG tablet  Commonly known as: ARICEPT     fludrocortisone 0.1 MG tablet  Commonly known as: FLORINEF     levothyroxine 50 MCG tablet  Commonly known as: SYNTHROID     memantine ER 28 MG Cp24 extended release capsule  Commonly known as: NAMENDA XR                NOTIFY YOUR PHYSICIAN FOR ANY OF THE FOLLOWING:   Fever over 101 degrees for 24 hours.   Chest pain, shortness of breath, fever,  chills, nausea, vomiting, diarrhea, change in mentation, falling, weakness, bleeding. Severe pain or pain not relieved by medications.  Or, any other signs or symptoms that you may have questions about.    DISPOSITION:    Home With:   OT  PT  HH  RN       Long term SNF/Inpatient Rehab    Independent/assisted living   X Hospice    Other:       PATIENT CONDITION AT DISCHARGE:     Functional status    Poor     Deconditioned     Independent      Cognition     Lucid     Forgetful     Dementia      Catheters/lines (plus indication)    Foley     PICC     PEG     None      Code status     Full code     DNR      PHYSICAL EXAMINATION AT DISCHARGE:    General : elderly, cachectic, nonverbal, not following commands.  HEENT: PEERL, EOMI, moist mucus membrane  Neck: supple, no JVD, no meningeal signs  Chest: intermittent apnea periods, rhonchorous lung sounds BL  CVS: S1 S2 heard, Capillary refill less than 2 seconds  Abd: soft/ nondistended, BS physiological  Ext: no clubbing, no cyanosis, no edema, brisk 2+ DP pulses  Neuro/Psych: GCS E4V1M4  Skin: warm    CHRONIC MEDICAL DIAGNOSES:  Principal Problem:    Comfort measures only status  Resolved Problems:    * No resolved hospital problems. *        Greater than 86 minutes were spent with the patient on counseling and coordination of care    Signed:   Ruthy Dick, PA-C  11/18/2022  9:01 AM

## 2022-11-04 NOTE — Progress Notes (Signed)
[facility]  Good Help to Those in Need  [phone]    Social Work Admission Note  Patient Name: [name]  Date of Birth: 11/06/1932  Age: 88 y.o.    Date of Visit: 11/18/22  Facility of Care: [facility]  Patient Room: 540/02     Hospice Attending: Ned Clines, MD  Hospice Diagnosis: Intracerebral hemorrhage of brainstem [facility]) [I61.3]    Level of Care:      GIP      Respite     Routine    Consents/NCD Documentation:     Consents Reviewed:     Yes    No, completed by other hospice staff member.    Person Reviewed/Signed with:    Patient     Pts NOK/MPOA  Name:     Right to NCD Reviewed:     Yes    No, completed by other hospice staff member.    NCD Requested:     Yes    No    Admission Nurse/Intake Notified NCD was requested:    Yes    No    Not requested    Planned Start of Care Date:     Hospice Witness Representative:    NARRATIVE   Pt is an 88 year old male admitted to inpatient hospice on 11-18-2022 with a hospice diagnosis of Intracerebral Hemorrhage of Brainstem.  Pt was admitted to hospital on 11/02/2022 for altered mental status.  Prior to hospitalization, pt was at [facility].  At time of visit, pt was resting in bed and unresponsive.  Pt's nephew August Saucer was at bedside.  August Saucer reported pt was a spiritual man who enjoys bluegrass and country music.  Dean plans to visit daily, since his mom Maryann (pt's sister-in-law/MPOA) is a few hours away and does not drive long distances.  Dean requested to be primary contact so Nita Sells does not get stressed out by phone calls in the night.  No other needs at this time.  LMSW provided supportive counseling in processing pt's prognosis.  Pt is DNR code status.  [facility] on [address] to serve.  LMSW will continue to provide support.    ADVANCE CARE PLANNING    Advance Directive:    Yes    No     Would like to complete  Primary MPOA: Delorise Shiner, sister-in-law  Secondary MPOA:   LNOK:   Code Status: DNR  Durable DNR: _  Yes  X_ No      18-Nov-2022     4:50 PM   Demographics   Marital Status Widowed       Relationship Status:    Single       Married        Divorced      Domestic Partner       Widow/Widower    Common Law    Separated    Unknown    If in a relationship, name of partner/spouse:  Duration of relationship:    Religion/Spirituality: Baptist    Active In Religion/Spirituality     Not Active     N/A  Notes:    Funeral Home: [facility] on [address]  Resources Provided:    [employer]    Information on applying for disability    FMLA Paperwork    Letters Requested by Family     Long Term Care Policy    Final Arrangements Resources     Outside counseling services (individual or group therapy)    Grief resources   []   Comfort Zone  []   [facility]   []   Personal Care Agencies   []   [facility] [facility]   []   Referral Sent to [employer]   []   Referral Sent to Music Therapy   []   Referral Sent to Pet Therapy  []   Other  Social Work Initial Assessment     Sex:  male    Pronoun Preference:   [x]   He/Him/His   []   She/Her/Hers   []   They/Them/Theirs   []    Patient Name   []    Decline to Answer  []    Unknown  []    Other   Notes:     Race/Ethnicity: (mark all that apply)  []   American or Native  []   Asian  []   Black or African American  []   Hispanic or Latino  []   Native or Pacific Islander  [x]   White  []   Unknown  []   Other     Inpatient Financial Agreement Completed:   [x]   Yes  []   No    Insurance:   [x]   Medicare   []   Medicaid     []   Private Insurance    []   Self-Pay/Uninsured   Notes:      Has pt applied for FAP?  []   Needs to Apply  []   Application Completed and Submitted   []   Approved/ Expiration Date:   [x]   N/A  Notes:     Has pt applied for Medicaid?  []   Needs to Apply  []   Application Completed and Submitted/Application Number:   [x]   N/A  Notes:     Has a long term care screening ([facility]) been requested?  []   Requested   []   Not Requested, Needs follow  up  []   Completed  [x]   N/A  Notes:     Does the pt have a long term care insurance policy?  []   Yes  [x]   No  []   Yes, Needing assistance with paperwork  Notes:     Military Service:    []   Yes   [x]   No       []   Unknown    Appropriate for Pinning Ceremony:   []   Yes      [x]   No    Is patient using VA benefits?   []   Yes      [x]   No  []   Needs assistance with accessing benefits  Notes:      Work History:   []   Full-Time/Part-Time  [x]   Retired   []   Other  Notes:     Primary Language: English  []   Interpreter Needed  []   Interpreter utilized during visit  []   form completed    Ability to express thoughts/needs/feelings  []   Expressed thoughts/feelings/needs without difficulty  []   Requires extra time and cuing  []   Speech limited single words  []   Uses only gestures (eye, blinking eye or head movement/pointing)  []   Unable to express thoughts/feelings/needs (speech unintelligible or inappropriate)  [x]   Unresponsive  Notes:      Mental Status:  []   Alert-oriented to:     []   Person     []   Place     []   Time  []   Comatose-responds to:    []    Verbal stimuli    []   Tactile stimuli    []   Painful stimuli  []   Forgetful  []   Disoriented/Confused  []   Lethargic  []   Agitated  [x]   Unresponsive  []   Other (specify):    Notes:      Patient's description of Illness/Current Health Status:    [x]   Patient unable to discuss  []   Patient unwilling to discuss  [x]   (Specify)    unresponsive    Knowledge/Understanding of Disease Process  Patient:    []   Demonstrates knowledge/understanding of disease process   []   Demonstrates knowledge/understanding of treatment plan   []   Demonstrates knowledge/understanding of prognosis   []   Demonstrates acceptance of prognosis   []   Demonstrates knowledge/understanding of resuscitation status   [x]   Other (specify) unresponsive  Caregiver:   [x]   Demonstrates knowledge/understanding of disease process   [x]   Demonstrates knowledge/understanding of treatment  plan   [x]   Demonstrates knowledge/understanding of prognosis   [x]   Demonstrates acceptance of prognosis   [x]   Demonstrates knowledge/understanding of resuscitation status   []   Other (specify)  Notes:      Patient's living arrangement/care setting:  Use the PRIOR COLUMN when the PATIENT'S current health status necessitated a change in his/her primary residence.    Prior Current Response              []              []     Patients own home/residence              []              []     Home of family member/friend              []              []     Boarding home/Group Home              []              []     Assisted living facility/retirement center              []              []     Hospital/Acute care facility              [x]              []     Skilled nursing facility              []              []     Long term care facility/Nursing home              []              [x]     Hospice in Patient      Primary Caregiver:  []   No Primary Caregiver  Name of Primary Caregiver: Franco Nones  Primary Caregiver Phone Number:   Relationship or Primary Caregiver:    []   Spouse/Significant other       []   Child        []   Step child       []   Sibling   []   Parent   []   Friend/Neighbor   []   Community/Church Volunteer   []   Paid help   [x]   Other (specify): sister in law  Notes:       Family members/Significant others:  Name: Siri Cole  Relationship: nephew  Phone Number:  Actively involved in care?  [x]   Yes  []   No    Name:  Relationship:  Phone Number:  Actively involved in care?  []   Yes  []   No    Social support systems: (select ONE best description)  []   Excellent social support system which includes three or more family members or friends  [x]   Good social support system which includes two or less members or friends  []   Fair social support which includes one family member or friend  []   Poor social support; no family members or friends; basically ALONE  Notes:      Emotional Status: (mark all that apply)    Patient  Caregiver Response                 [x]                 [x]     Mood/Affect stable and appropriate                   []                 []     Angry                 []                 []     Anxious                 []                 []     Apprehensive                 []                 []     Avoidant                 []                 []     Clinging                 []                 []     Depressed                 []                 []     Distraught                 []                 []     Fearful                 []                 []     Flat Affect                 []                 []     Helpless                 []                 []     Hostile                 []                 []   Impulsive                 []                 []     Irritable                 []                 []     Labile                 []                 []     Manic                 []                 []     Restlessness                 []                 []     Sad                 []                 []     Strain/Stress                 []                 []     Suspicious                 []                 []      Tearful                 []                  []      Withdrawn          Notes:     Coping Skills (strengths/weakness):    Patient: Coping Skills (strength/weakness): good familial support   Family/caregiver (strength/weakness): good familial support, sudden decline     Burden of care upon discharge (mark all that apply):     [x]   No burden evident   []   Family must administer medications   []   Illness causing financial strain   []   Family/Support feels overwhelmed   []   Family/Support sleep disturbed with patient's care   []   Patient's care causes extra physical stress  of death  []   Illness causes changes in family lifestyle  []   Illness impacting family/support employment  []   Family experiencing increased time demands  []   Patient's behavior endangers family  []   Denial of patient's illness  []   Concern over outcome of illness/fear  []   Patient's behavior  embarrassing to family   Notes:      Risk Factors: (mark all that apply):    [x]   No burden evident   []   Alcohol abuse  []   Financial resources inadequate to meet basic needs (food/house/etc)  []   Financial resources inadequate to meet health care needs (supplies/equipment/medications)  []   Food/nutrition resources inadequate  []   Home environment unsafe/inadequate for home care  []   Homicidal risk  []   Lives alone or without concerned relatives  []   Multiple medications/complex schedule  []   Physical limitations increase likelihood of falls  []   Plan of care/treatments complicated  []   Substance use/abuse    Suicidal risk    Visual impairment threatens safety/ability to perform self-care    Other (specify):     Abuse/Neglect (actual/potential risks):    No signs of abuse/neglect    History of abuse/neglect                   Physical            Sexual    History of domestic violence    Lacks adequate physical care    Lacks emotional nurturing/support    Lacks appropriate stimulation/cognitive experiences    Left alone inappropriately    Lacks necessary supervision    Inadequate or delayed medical care    Unsafe environment (i.e guns/drug use/history of violence in the home/etc.)    Bruising or other physical signs of injury present    Refer to child/adult protective services    Other (specify):   Notes:      Current Sources of Stress (in Addition to Current Illness):     None reported    Bills/Debt      Career/Job change      Child care (short term)      Child care (long term)      Death of a child (recent)      Death of a parent (recent)     Death of a spouse (recent)     Employment status changed     Family discord      Financial loss/Inadequate inther (specify):come    Job loss    Legal issues unresolved    Lifestyle change    Marital discord    Marriage within the last year    Paperwork (insurance/legal/etc) overwhelming     Separation/Divorce    Other (specify):  Notes:       Interventions/Plan of Care     MSW will assess social and emotional factors related to coping with end of life issues    MSW will provide community resource planning/referral     MSW to assist with relocation to different care setting if/when symptoms stabilize    Pt/Pts family will receive emotional support.    Pt/Pts family will share the details surrounding the pts disease progression    Pt/Pts Family will show expression of grief by participating in life review.    Pt/Pts Family will be educated and able to verbalize understanding of mental, emotional, and physical symptoms of grief.     Pt/Pts Family will be educated and able to identify skills and social support to help cope with grief.    Pts family will receive support for grief with emphasis on developmentally appropriate language.   Other:   Notes:       Discharge Planning     Home with family member      Return back to nursing home/facility    Needs assistance with placement/paid caregivers    Short Stay under routine level of care at [facility]     Other  Notes:     MSW Assessment Completed by: Clide Deutscher, MSW  10/27/2022

## 2022-11-04 NOTE — H&P (Signed)
[facility]   [facility]  [phone]    Patient Name: [name]  Date of Birth: 06-19-1932    Date of Provider Hospice Visit: 10/26/2022    Level of Care:   [x]  General Inpatient (GIP)    []  Routine   []  Respite    Current Location of Care:  [x]  [facility] []  [facility] []  [facility] []  [facility] []  [facility])    IF [facility], patient referred from:  []  [facility] []  [facility] []  [facility] []  [facility] []  Home []  Other:     Date of Original Hospice Admission: 11/12/2022  Hospice Medical Director at time of admission: Dr.    Principle Hospice Diagnosis: ICH  Diagnoses RELATED to the terminal prognosis: Paroxysmal atrial fibrillation on oral anticoagulation  Other Diagnoses: Dementia, HLD, hypothyroidism     HOSPICE SUMMARY     [name] is a 88 y.o. year old Caucasian male who was admitted to [facility].     HPI and course of hospitalization as summarized in [facility] [place] [facility]'s most recent progress note:  "This is an 88 year old man with a past medical history significant for dementia, paroxysmal atrial fibrillation on Eliquis, status post pacemaker insertion, dyslipidemia, and hypothyroidism, presented at the emergency room with change in mental status.  The patient resides at the memory unit at the assisted living facility.  The patient was noted to be in his bed and could not wake up.  The patient did not respond to painful stimuli.  EMS was called.  When the EMS arrived at the scene, it was reported that the patient has intermittent apnea and the patient was brought to the emergency room for further evaluation.  When the patient arrived at the emergency room, CT scan of the head shows large acute parenchymal hematoma in the left parietal lobe with mass effect.  The patient is not able to provide history.  History was obtained from the patient's niece, who was present at the bedside.  The niece was also the power of attorney.  The goal of care was discussed with the niece by the emergency room physician.   The patient's niece did not want any heroic measures and the patient has an active DNR status, which went into reset.  The patient was made comfort measures only status following the discussion between the emergency room physician and the patient's niece.  The patient received morphine in the emergency room and the patient was in the emergency room for couple of hours with expectation that the patient was going to expire.  When the patient did not expire despite being on comfort measures only in the emergency room, the patient was referred to the hospitalist service for admission.  I had further discussion with the patient's niece at the bedside about the comfort measures only and his status categorized as that is the treatment goal that is wanted for the patient.  The patient is being admitted to the hospital for continuation of comfort measures only status started in the emergency room.  There was no history of fall at the nursing home reported.  It was just stated that the patient was found on the bed and he was not responsive."        Interval history / Subjective:      Pt laying in bed. Demonstrating intermittent apnea. Nonverbal, not following commands. Discussion with nephew at bedside. Family would like comfort measures only and are interested in admission  to inpatient hospice. Hospice nurse working on getting in contact with [facility].       Assessment & Plan:         Left-sided nontraumatic intracerebral hemorrhage.  - per CT  - discussion with family and consult hospice team - likely dc to inpatient hospice tomorrow morning      Dementia.  Paroxysmal atrial fibrillation, on Eliquis for anticoagulation.  Status post pacemaker insertion.  Dyslipidemia.  Hypothyroidism.  - Comfort measures only      The patient's principle diagnosis has resulted in altered mental status increased work of breathing, and signs of pain.    Functionally, the patient's Karnofsky and/or Palliative Performance Scale has declined over a  period of days and is estimated at 10.     The patient is dependent on the following ADLs: all    Objective information that support this patients limited prognosis includes:   Narrative & Impression  Indication:  altered      Comparison: CT June 28, 2021     Findings: 5 mm axial images were obtained from the skull base through the  vertex.      CT dose reduction was achieved through the use of a standardized protocol  tailored for this examination and automatic exposure control for dose  modulation.     There is a large acute parenchymal hemorrhage in the left parietal lobe, with  associated mass effect and slight rightward midline shift. Total volume of  hemorrhage is 5.7 x 8 x 4.1 cm (93 CC). Rightward midline shift measures  approximately 6 mm. There is no hydrocephalus. There is a chronic right temporal  lobe infarct. There is no extra-axial collection. The skull is intact.     IMPRESSION:  1. Large acute parenchymal hematoma in the left parietal lobe, with mass effect  and 6 mm of rightward midline shift.  2. Chronic right temporal lobe infarction.  3. 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    The patient/family chose comfort measures with the support of Hospice.     HOSPICE DIAGNOSES   Active Symptoms:  1. ICH  2. Altered mental status  3. Increased work of breathing  4. Signs of pain  5. Seizure prophylaxis  6. Hospice care     PLAN   Admit to GIP level of care as pt requires frequent nursing assessment and administration and titration of parenteral medications to manage symptom burden  Ativan 2 mg IV every 4 hours for restlessness and seizure prophylaxis; with prn dosing available every 15 minutes as needed for breakthrough symptoms  Morphine 2 mg IV every 4 hours for increased work of breathing and/or signs of pain; with prn dosing available every 15 minutes for breakthrough symptoms  All other symptom management medications as needed  Oxygen for  comfort  Place foley catheter to maintain bladder decompression; foley care per facility protocol  Nephew Dean at bedside during provider rounds; reviewed hospice philosophy and goals of care with emphasis on comfort and safety; reviewed symptom management medications with indications and rationale for use; verbalized understanding and all questions answered  Chaplain and SW to support family needs  Disposition: anticipate death in hospital  Hospice Plan of care was reviewed in detail and agree with current plan of care    Prognosis estimated based on 12/02/22 clinical assessment is:   [x]  Hours to Days    []  Days to Weeks    []  Other:    Communicated  plan of care with: Hospice Case Manager; Hospice IDT; Care Team     GOALS OF CARE     Patient/Medical POA stated Goal of Care: comfort care and symptom management    []  I have reviewed and/or updated ACP information in the Advance Care Planning Navigator. This information is available in the [place] link in the patient's chart header.    Primary Decision Maker ([place]):     Resuscitation Status: DNR No additional code details  If DNR is there a Durable DNR on file? : []  Yes [x]  No (If no, complete Durable DNR)    HISTORY     History obtained from: chart review; discussion with liaison; discussion with nephew at bedside    CHIEF COMPLAINT: unable to obtain  The patient is:   []  Verbal  []  Nonverbal  [x]  Unresponsive    HPI/SUBJECTIVE:  Cindie Laroche at bedside during provider rounds. Pt observed in bed. He is unresponsive. Generalized pallor. Slight increased work of breathing with use of accessory muscles. No seizure activity observed.       REVIEW OF SYSTEMS     The following systems were: []  reviewed  [x]  unable to be reviewed    Positive ROS include:  Constitutional: fatigue, weakness, in pain, short of breath  Ears/nose/mouth/throat: increased airway secretions  Respiratory:shortness of breath, wheezing  Gastrointestinal:poor appetite, nausea,  vomiting, abdominal pain, constipation, diarrhea  Musculoskeletal:pain, deformities, swelling legs  Neurologic:confusion, hallucinations, weakness  Psychiatric:anxiety, feeling depressed, poor sleep  Endocrine:     Adult Non-Verbal Pain Assessment Score: 0    Face  [x]  0   No particular expression or smile  []  1   Occasional grimace, tearing, frowning, wrinkled forehead  []  2   Frequent grimace, tearing, frowning, wrinkled forehead    Activity (movement)  [x]  0   Lying quietly, normal position  []  1   Seeking attention through movement or slow, cautious movement  []  2   Restless, excessive activity and/or withdrawal reflexes    Guarding  [x]  0   Lying quietly, no positioning of hands over areas of body  []  1   Splinting areas of the body, tense  []  2   Rigid, stiff    Physiology (vital signs)  [x]  0   Stable vital signs  []  1   Change in any of the following: SBP > 72mm Hg; HR > 20/minute  []  2   Change in any of the following: SBP > 28mm Hg; HR > 25/minute    Respiratory  [x]  0   Baseline RR/SpO2, compliant with ventilator  []  1   RR > 10 above baseline, or 5% drop SpO2, mild asynchrony with ventilator  []  2   RR > 20 above baseline, or 10% drop SpO2, asynchrony with ventilator     FUNCTIONAL ASSESSMENT     Palliative Performance Scale (PPS): 10     PSYCHOSOCIAL/SPIRITUAL ASSESSMENT     Principal Problem:    Intracerebral hemorrhage of brainstem (HCC)  Resolved Problems:    * No resolved hospital problems. *    Past Medical History:   Diagnosis Date    Arrhythmia     Arthritis     Atrial fibrillation (HCC)     Chronic pain     RIGHT HIP    Dementia ([place])     Hypercholesterolemia     Insomnia     Long term current use of anticoagulant therapy     Pacemaker  Stroke [facility])       Past Surgical History:   Procedure Laterality Date    PACEMAKER      PR ABDOMEN SURGERY PROC UNLISTED        Social History     Tobacco Use    Smoking status: Every Day    Smokeless tobacco: Never   Substance Use Topics    Alcohol use: Yes      Family History   Problem Relation Age of Onset    Heart Disease Brother     Heart Disease Father     Cancer Mother         BREAST CA      Allergies   Allergen Reactions    Fluarix Quadrivalent [Influenza Vac Split Quad]      Unknown       Current Facility-Administered Medications   Medication Dose Route Frequency    sodium chloride flush 0.9 % injection 5-40 mL  5-40 mL IntraVENous PRN    ketorolac (TORADOL) injection 30 mg  30 mg IntraVENous Q6H    bisacodyl (DULCOLAX) suppository 10 mg  10 mg Rectal Daily PRN    ondansetron (ZOFRAN) injection 4 mg  4 mg IntraVENous Q6H PRN    glycopyrrolate (ROBINUL) injection 0.2 mg  0.2 mg IntraVENous Q4H PRN    LORazepam (ATIVAN) injection 2 mg  2 mg IntraVENous Q4H    LORazepam (ATIVAN) injection 2 mg  2 mg IntraVENous Q15 Min PRN    morphine (PF) injection 2 mg  2 mg IntraVENous Q15 Min PRN    morphine (PF) injection 2 mg  2 mg IntraVENous Q4H        PHYSICAL EXAM     Wt Readings from Last 3 Encounters:   08/09/21 82.6 kg (182 lb)   06/26/21 80.7 kg (178 lb)   05/28/21 86.8 kg (191 lb 6.4 oz)       There were no vitals filed for this visit.    Supplemental O2  [x]  Yes  []  NO  Last bowel movement:     Currently this patient has:  [x]  Peripheral IV []  PICC  []  PORT []  ICD    []  Foley Catheter []  NG Tube   []  PEG Tube    []  Rectal Tube []  Drain  [x]  Other: pacemaker    Constitutional: unresponsive  Eyes: closed  ENMT: oral mucosa dry  Cardiovascular: irregular, distal pulses weak  Respiratory: increased work of breathing; use of accessory muscles; few secretions  Gastrointestinal: soft  Musculoskeletal: no gross deformities  Skin: thin/fragile; generalized pallor; feet cool to touch  Neurologic: unresponsive; no seizure activity observed  Psychiatric: appears calm     Pertinent Lab and or Imaging Tests:  Lab Results   Component Value Date/Time    NA 140 11/02/2022 06:22 PM    K 4.3 11/02/2022 06:22 PM    CL 109 11/02/2022 06:22 PM    CO2 25 11/02/2022 06:22 PM    BUN 13  11/02/2022 06:22 PM    GFRAA >60 06/29/2021 03:36 AM     No results found for: "TP", "ALBR", "ALB"        , APRN - NP

## 2022-11-04 NOTE — Progress Notes (Signed)
[place] [facility]  [phone]    Inpatient Nursing Admission   Patient Name: [name]  Date of Birth: Aug 18, 1932  Age: 88 y.o.       Date of Hospice Admission: (Not on file)  Hospice Attending Elected by Patient: Marlaine Hind Adriana Simas, MD  Primary Care Physician: Sonnie Alamo, APRN - NP  Admitting RN: Earnestine Leys RN  Social Worker: Cleda Daub     Level of Care (GIP/Routine/Respite): GIP  Facility of Care: [facility]  Patient Room: 540/02     HOSPICE SUMMARY   ER Visits/ Hospitalizations in past year: 1  Hospice Diagnosis: Intracerebral hemorrhage of brainstem ([place]) [I61.3]  Onset Date of Hospice Diagnosis: yesterday  Summary of Disease Progression Leading to Hospice Diagnosis: Per Hospitalist;    88 year old man with a past medical history significant for dementia, paroxysmal atrial fibrillation on Eliquis, status post pacemaker insertion, dyslipidemia, and hypothyroidism, presented at the emergency room with change in mental status.  The patient resides at the memory unit at the assisted living facility.  The patient was noted to be in his bed and could not wake up.  The patient did not respond to painful stimuli.  EMS was called.  When the EMS arrived at the scene, it was reported that the patient has intermittent apnea and the patient was brought to the emergency room for further evaluation.  When the patient arrived at the emergency room, CT scan of the head shows large acute parenchymal hematoma in the left parietal lobe with mass effect.  The patient is not able to provide history.  History was obtained from the patient's niece, who was present at the bedside.  The niece was also the power of attorney.  The goal of care was discussed with the niece by the emergency room physician.  The patient's niece did not want any heroic measures and the patient has an active DNR status, which went into reset.  The patient was made comfort measures only status following the  discussion between the emergency room physician and the patient's niece.  The patient received morphine in the emergency room and the patient was in the emergency room for couple of hours with expectation that the patient was going to expire.  When the patient did not expire despite being on comfort measures only in the emergency room, the patient was referred to the hospitalist service for admission.  I had further discussion with the patient's niece at the bedside about the comfort measures only and his status categorized as that is the treatment goal that is wanted for the patient.  The patient is being admitted to the hospital for continuation of comfort measures only status started in the emergency room.  There was no history of fall at the nursing home reported.  It was just stated that the patient was found on the bed and he was not responsive."        Interval history / Subjective:      Pt laying in bed. Demonstrating intermittent apnea. Nonverbal, not following commands. Discussion with nephew at bedside. Family would like comfort measures only and are interested in admission to inpatient hospice. Hospice nurse working on getting in contact with [facility].       Assessment & Plan:         Left-sided nontraumatic intracerebral hemorrhage.  - per CT  - discussion with family and consult hospice team - likely dc to inpatient hospice  tomorrow morning      Dementia.  Paroxysmal atrial fibrillation, on Eliquis for anticoagulation.  Status post pacemaker insertion.  Dyslipidemia.  Hypothyroidism.  - Comfort measures only         Code status: DNR  Prophylaxis: N/a  Care Plan discussed with: Hospice nurse, pt family   Anticipated Disposition: Tomorrow morning  Inpatient  Cardiac monitoring: None        Co-Morbidities:   Patient Active Problem List   Diagnosis    Acute CVA (cerebrovascular accident) (HCC)    Tachycardia-bradycardia syndrome (HCC)    Pacemaker    At high risk for falls    Paroxysmal atrial fibrillation  (HCC)    Chronic renal disease, stage III (HCC)    Comfort measures only status    Intracerebral hemorrhage of brainstem (HCC)     Diagnoses RELATED to the terminal prognosis: Tachycardia  Other Diagnoses: pacemaker    Rationale for a prognosis of life expectancy of 6 months or less if the disease follows its normal course (Disease Specific History):     [name] is a 88 y.o. who [facility]. The patient's principle diagnosis of ICH has resulted in labored breathing, unresponsive, non verbal pain.  Functionally, the patient's Palliative Performance Scale has declined over a period of hours  and is estimated at 10. Objective information that support this patients limited prognosis includes: Patient had a ICH with midline shift, on arrival confused, agitated., now is unresponsive with labored breathing.  The patient/family chose comfort measures with the support of Hospice.    Patient meets for GIP LOC as evidenced by dyspnea, pain    Prognosis estimated based on 10/17/2022 clinical assessment is:   []  Few to Many Hours  [x]  Hours to Days   []  Few to Many Days   []  Days to Weeks   []  Few to Many Weeks   []  Weeks to Months   []  Few to Many Months    ASSESSMENT    Patient self-reports:  []   Yes    [x]  No    SYMPTOMS: dyspnea, pain    SIGNS/PHYSICAL FINDINGS: labored breaths, non verbal pain    KARNOFSKY: 10    FAST for all dementia:      Learning Assessment:  Patient  Is patient willing/able to learn?  What is the highest level of education completed?  Learning preference (written material, demonstration, visual)?  Learning barriers (ESOL, HOH, poor vision)?    Caregiver  Is caregiver willing to learn care for patient?  What is the highest level of education completed?  Learning preference (written material, demonstration, visual)?  Learning barriers (ESOL, HOH, poor vision)?    CLINICAL INFORMATION     Wt Readings from Last 3 Encounters:   08/09/21 82.6 kg (182 lb)   06/26/21 80.7 kg (178 lb)    05/28/21 86.8 kg (191 lb 6.4 oz)     Ht Readings from Last 3 Encounters:   08/09/21 1.854 m (6\' 1" )   08/01/21 1.854 m (6\' 1" )   06/26/21 1.854 m (6\' 1" )     There is no height or weight on file to calculate BMI.    There were no vitals filed for this visit.    LAB VALUES  @CBCTHISVISIT @  @BMPTHISVISIT @  No results found for: "TP", "ALBR", "ALB"    Currently this patient has:  []  Supplemental O2 [x]  Peripheral IV  []  PICC    []  PORT   []  Foley Catheter []   NG Tube   []  PEG Tube []  Ostomy    []  AICD: Has ICD been deactivated?  []  Yes []  No:______    PLAN     1. Admit Gip level of care to [facility]  2. Scheduled IV morphine 2mg  q4hr and prn  3. Scheduled IV lorazepam 2mg  q4hr and prn  4. Other comfort medications for breakthru symptoms   5. Support to family with chaplain and SW  6. Education with facility staff    Hospice Team Frequency Orders:  Skilled Nurse -   Daily x 7 days /every other day x 7 days  with 5 PRN visits for symptom control. MSW - 1 visit for initial assessment/evaluation for family support and need for volunteer services. - 1 visit for initial assessment/evaluation for spiritual support.     ADVANCE CARE PLANNING (Complete in ACP Flow Sheet)   Code Status: DNR  Durable DNR: [x]   Yes  []   No  Code Status Discussed/Confirmed:  Preference for Other Life Sustaining Treatment Discussed/Confirmed:  Hospitalization Preference:  Patient would like to receive end of life care in the hospital.      11/30/22     3:06 PM   Demographics   Marital Status Widowed       : []  Yes  []   No      [x]  Unknown  Appropriate for Pinning Ceremony:  []  Yes     []  No  Religion: Home: [employer]    DISCHARGE PLANNING     1. Discharge Plan: patient may return to facility should he stabilize  2. Patient/Family teaching: symptom management  3. Response to patient/family teaching: Nephew verbalizes undersTANDING    SOCIAL/EMOTIONAL/SPIRITUAL NEEDS     Spiritual Issues Identified: family needs  support    Psych/ Social/ Emotional Issues Identified: family needs support    Caregiver Support:  []  Provided information on End of Life Care   []  Material Provided: Gone From My Sight or [facility]     CARE COORDINATION   Dr. [facility] contacted, discharge to hospice order received  Dr. contacted, agrees to serve as attending provider for hospice and provided verbal certification of terminal illness with life expectancy of 6 months or less. Orders for hospice admission, medications and plan of treatment received. Medication reconciliation completed.  MEDS: See medication list below  DME: Per hospital  Supplies: Per hospital  IDT communication to include MD, SN, SW, CH and support team    ALLERGIES AND MEDICATIONS     Allergies:   Allergies   Allergen Reactions    Fluarix Quadrivalent [Influenza Vac Split Quad]      Unknown      Current Facility-Administered Medications   Medication Dose Route Frequency    sodium chloride flush 0.9 % injection 5-40 mL  5-40 mL IntraVENous PRN    ketorolac (TORADOL) injection 30 mg  30 mg IntraVENous Q6H    bisacodyl (DULCOLAX) suppository 10 mg  10 mg Rectal Daily PRN    ondansetron (ZOFRAN) injection 4 mg  4 mg IntraVENous Q6H PRN    glycopyrrolate (ROBINUL) injection 0.2 mg  0.2 mg IntraVENous Q4H PRN    LORazepam (ATIVAN) injection 2 mg  2 mg IntraVENous Q4H    LORazepam (ATIVAN) injection 2 mg  2 mg IntraVENous Q15 Min PRN    morphine (PF) injection 2 mg  2 mg IntraVENous Q15 Min PRN    morphine (PF) injection 2 mg  2 mg IntraVENous Q4H  No current outpatient medications on file.     Facility-Administered Medications Ordered in Other Encounters   Medication Dose Route Frequency    scopolamine (TRANSDERM-SCOP) transdermal patch 1 patch  1 patch TransDERmal Q72H    acetaminophen (TYLENOL) suppository 650 mg  650 mg Rectal Q4H PRN    ondansetron (ZOFRAN) injection 4 mg  4 mg IntraVENous Q6H PRN    LORazepam (ATIVAN) injection 1 mg  1 mg IntraVENous Q6H PRN     morphine (PF) injection 2 mg  2 mg IntraVENous Q2H PRN    Or    morphine (PF) injection 4 mg  4 mg IntraVENous Q2H PRN

## 2022-11-04 NOTE — Plan of Care (Signed)
Problem: Hospice Orientation  Goal: Demonstrate understanding of hospice philosophy, plan of care, and inpatient hospice program  Description: The patient/family/caregiver will demonstrate understanding of hospice philosophy, plan of care and the inpatient hospice program as evidenced by participation in meeting the patient's psychosocial, spiritual, medical, and physical needs inclusive of medical supplies/equipment focusing on symptoms.  Outcome: Progressing     Problem: Pain  Goal: Control of acute pain  Description: Patient  will exhibit a decrease in pain as evidenced by a pain rating less than 2 on the Adult Nonverbal Pain scale within 1 hour of receiving pain medication during the inpatient hospice stay.    Outcome: Progressing     Problem: Breathing Pattern - Ineffective  Goal: Use of effective breathing techniques  Description: Patient  will indicate an effective breathing pattern as evidenced by the absence of respiratory distress each shift during the inpatient hospice stay.    Outcome: Progressing     Problem: Seizures  Goal: Management of seizure activity  Description: Patient  will receive a treatment regimen to eliminate or control seizure activity and remain free from injury, as evidenced by the absence of seizure activity or injury each shift during the inpatient hospice stay.     Outcome: Progressing  Goal: Interventions  Outcome: Progressing     Problem: End of Life Process  Goal: Demonstrate understanding of end of life processes  Description: Patient  family/ caregiver will verbalize recall or return demonstration of emotional support and stress-reduction strategies and appropriate grieving on admission and ongoing as needed by the IDG members throughout the dying process during the inpatient hospice stay.    Outcome: Progressing     Problem: Dyspnea Due to End of Life  Goal: Demonstrate understanding of and ability to manage respiratory symptoms at end of life  Description: Patient  and or  family/caregiver will verbalize recall of breathing strategies to maintain an effective breathing pattern during the inpatient hospice stay.        Outcome: Progressing

## 2022-11-04 NOTE — Progress Notes (Signed)
[facility]  [phone]    Patient Name: [name]  Date of Birth: 1932-04-09  Age: 88 y.o.    [place] RN Note:  Hospice consult noted. Chart reviewed. Plan of care discussed with patients nurse & care manager.   In to meet with nephew.   Discussed Hospice philosophy, general plan of care, levels of care, services and on call procedures.      Assessed patient for GIP level of care, having labored breathing and very warm to touch. Drooling with some gurgling.  Family has agreed to Hospice GIP.    Thank you for the opportunity to be of service to this patient.   Earnestine Leys RN  [phone]

## 2022-11-04 NOTE — Hospice (Unsigned)
Family agrees to plan of care for the next 2 weeks.

## 2022-11-05 MED ORDER — KETOROLAC TROMETHAMINE 30 MG/ML IJ SOLN
30 MG/ML | Freq: Four times a day (QID) | INTRAMUSCULAR | Status: AC | PRN
Start: 2022-11-05 — End: 2022-11-09

## 2022-11-05 MED FILL — MORPHINE SULFATE 2 MG/ML IJ SOLN: 2 mg/mL | INTRAMUSCULAR | Qty: 1

## 2022-11-05 MED FILL — LORAZEPAM 2 MG/ML IJ SOLN: 2 MG/ML | INTRAMUSCULAR | Qty: 1

## 2022-11-05 NOTE — Progress Notes (Signed)
[facility]  [phone]     Patient Name: [name]  Date of Birth: [birth year]  Age: 88 y.o.    Received written permission from [employer] to give medical updates on patients condition to Siri Cole.    Thank you for the opportunity to be of service to this patient.    Daphene Jaeger, BSN, RN  Clinical Nurse Liaison  [facility]  [phone]  Office: [phone]  Available on Perfect Serve

## 2022-11-05 NOTE — Progress Notes (Signed)
[facility]  [phone]    GIP Daily Nursing Note   Patient Name: [name]  Date of Birth: Feb 03, 1932  Age: 88 y.o.    Date of Visit: 11/05/22  Facility of Care: [facility]  Patient Room: [place] Attending: Marianna Payment, MD  Hospice Diagnosis: Intracerebral hemorrhage of brainstem [facility]) [I61.3]    Level of Care: GIP    Current GIP Symptoms    1. Labored breathing  2. Non verbal pain  3. unresponsive        ASSESSMENT & PLAN   Patient resting peacefully with son at bedside, no mottling noted, warm to touch    1. GIP  for management of pain, dyspnea and anxiety  2. Continue  IV dilaudid 2mg  every 4 hours and every 15 min PRN pain, air hunger  3. PRN comfort set for breakthrough symptoms  4. SW and chaplain available for ongoing support  5. Maintain peripheral IV per hospital protocol  6. Place foley for urinary retention and maintain per hospital protocols.  7. Provide emotional support to family at bedside  8. Frequent rounding and communication with bedside nurse and hospice team to ensure comfort and titrate medications as needed      Spiritual Interventions:     Psych/ Social/ Emotional Interventions:     Care Coordination Needs:     Care plan and New Orders discussed / approved with F[place] MD.    Description History and Chart Review     Narrative History of last 24 hours that demonstrates care cannot be provided in another setting:  IV morphine and dilaudid    What has been done to control the patient's symptoms in the last 24 hours?      Does the patient currently require IV medications? yes  Does the patient currently require scheduled medications? yes  Does the patient currently require a PCA? no    List number of doses of PRN medications in last 24 hours:  Medication 1:  Number of doses:    Medication 2:   Number of doses:    Medication 3:   Number of doses:    Supporting documentation for GIP need for pain control:  [x]  Frequent evaluation by a  doctor, nurse practitioner, nurse   [x]  Frequent medication adjustment    [x]  IVs that cannot be administered at home   []  Aggressive pain management   []  Complicated technical delivery of medications              Supporting documentation for GIP need for symptom control:  [x]   Sudden decline necessitating intensive nursing intervention  []   Uncontrolled / intractable nausea or vomiting   []   Pathological fractures  []   Advanced open wounds requiring frequent skilled care  []  Unmanageable respiratory distress  []  New or worsening delirium   []  Delirium with behavior issues: Is 24 hour caregiver present due to safety concerns with agitation? (yes/no)  []  Imminent death - with skilled nursing needs documented above    DISCHARGE PLANNING   Daily discharge planning required for GIP  1. Discharge Plan: patient will expire in hospital  2. Patient/Family teaching: what to expect with end of life  3. Response to patient/family teaching: verbalized understanding    ASSESSMENT    KARNOFSKY: 20    Prognosis estimated based on 11/05/22 clinical assessment is:   []  Few to Many Hours  [x]  Hours to Days   []   Few to Many Days   []  Days to Weeks   []  Few to Many Weeks   []  Weeks to Months   []  Few to Many Months    Quality Measure: Patient self-reports:  []  Yes    []  No    ESAS:   Time of Assessment: 1130  Pain (1-10):  Fatigue (1-10):   Shortness of breath (1-10): 3  Nausea (1-10):   Appetite (1-10):   Anxiety: (1-10):   Depression: (1-10):   Well-being: (1-10):   Constipation: _ Yes  _ No  LAST BM:     CLINICAL INFORMATION   Patient Vitals for the past 12 hrs:   Temp Pulse Resp BP SpO2   11/05/22 1256 -- 57 -- (!) 160/80 96 %   11/05/22 1044 -- 85 -- (!) 158/74 --   11/05/22 0815 97.5 F (36.4 C) (!) 134 17 139/67 97 %       Currently this patient has:  []  Supplemental O2   [x]  IV    []  PICC      []  PORT   []  NG Tube    []  PEG Tube   []  Ostomy     []  Foley draining _______ urine  []  Other:     SIGNS/PHYSICAL FINDINGS     Skin  (including wound):  []  Warm, dry, supple, intact and color normal for race  []  Warm   []  Dry   [x]  Cool     []  Clammy       []  Diaphoretic    Turgor   []  Normal   [x]  Decreased  Color:   []  Pink   [x]  Pale   []  Cyanotic   []  Erythema   []  Jaundice   []  Normal for Race  []   Wounds:    Neuro:  [x]  Lethargy  []  Restlessness / agitation  []  Confusion / delirium  []  Hallucinations  []  Responds to maximal stimulation  [x]  Unresponsive  []  Seizures     Cardiac:  []  Dyspnea on Exertion  []  JVD  []  Murmur  []  Palpitations  []  Hypotension  []  Hypertension  []  Tachycardia  []  Bradycardia  []  Irregular HR  []  Pulses Decreased  []  Pulses Absent  [x]  Edema:   BLE  []  Mottling:      Respiratory:  Breath sounds:    [x]  Diminished   []  Wheeze   []  Rhonchi   []  Rales   []  Even and unlabored  []  Labored:            []  Cough   []  Non Productive   []  Productive    []  Description:           []  Deep suctioned   []  O2 at ___ LPM  []  High flow oxygen greater than 10 LPM  []  Bi-Pap    GI  []  Abdomen (describe)   []  Ascites  []  Nausea  []  Vomiting  []  Incontinent of bowels  []  Bowel sounds (yes/no)  []  Diarrhea  []  Constipation (see above including last bowel movement)  []  Checked for impaction  []  Last BM     Nutrition  Diet:___NPO_______  Appetite:   []  Good   []  Fair   []  Poor   []  Tube Feeding     GU  []  Voiding  []  Incontinent   [x]  Foley    Musculoskeletal  []  Balance/Galt Unsteady   [x]  Weak   Strength:    []  Normal    []  Limited    [  x] Decreasing   Activities:    []  Up as tolerated   [x]  Bedridden    []  Specify:    SAFETY  []  24 hr. Caregiver   [x]  Side rails ?    [x]  Hospital bed   []  Reviewed Falls & Safety       ALLERGIES AND MEDICATIONS     Allergies:   Allergies   Allergen Reactions    Fluarix Quadrivalent [Influenza Vac Split Quad]      Unknown        Current Facility-Administered Medications   Medication Dose Route Frequency    sodium chloride flush 0.9 % injection 5-40 mL  5-40 mL IntraVENous PRN    bisacodyl (DULCOLAX)  suppository 10 mg  10 mg Rectal Daily PRN    ondansetron (ZOFRAN) injection 4 mg  4 mg IntraVENous Q6H PRN    glycopyrrolate (ROBINUL) injection 0.2 mg  0.2 mg IntraVENous Q4H PRN    LORazepam (ATIVAN) injection 2 mg  2 mg IntraVENous Q4H    LORazepam (ATIVAN) injection 2 mg  2 mg IntraVENous Q15 Min PRN    morphine (PF) injection 2 mg  2 mg IntraVENous Q15 Min PRN    morphine (PF) injection 2 mg  2 mg IntraVENous Q4H    ketorolac (TORADOL) injection 30 mg  30 mg IntraVENous Q6H PRN          Visit Time In: 1000  Visit Time Out: 1030    , BSN, RN  Clinical Nurse Liaison  [facility]  Mobile:([phone]  Office: [phone]  Available on Perfect Serve

## 2022-11-05 NOTE — Plan of Care (Signed)
Problem: Discharge Planning  Goal: Discharge to home or other facility with appropriate resources  11/05/2022 0103 by Alanda Amass, RN  Outcome: Progressing  11/05/2022 0101 by Alanda Amass, RN  Outcome: Progressing

## 2022-11-05 NOTE — Plan of Care (Signed)
Problem: Chronic Conditions and Co-morbidities  Goal: Patient's chronic conditions and co-morbidity symptoms are monitored and maintained or improved  Outcome: Not Progressing  Flowsheets (Taken 11/05/2022 0830)  Care Plan - Patient's Chronic Conditions and Co-Morbidity Symptoms are Monitored and Maintained or Improved: Monitor and assess patient's chronic conditions and comorbid symptoms for stability, deterioration, or improvement

## 2022-11-05 NOTE — Plan of Care (Deleted)
Problem: Chronic Conditions and Co-morbidities  Goal: Patient's chronic conditions and co-morbidity symptoms are monitored and maintained or improved  Recent Flowsheet Documentation  Taken 11/05/2022 2016 by Catalina Antigua, Ness - Patient's Chronic Conditions and Co-Morbidity Symptoms are Monitored and Maintained or Improved: Monitor and assess patient's chronic conditions and comorbid symptoms for stability, deterioration, or improvement  11/05/2022 1936 by Derrill Center, LPN  Outcome: Not Progressing  Flowsheets (Taken 11/05/2022 0830)  Care Plan - Patient's Chronic Conditions and Co-Morbidity Symptoms are Monitored and Maintained or Improved: Monitor and assess patient's chronic conditions and comorbid symptoms for stability, deterioration, or improvement     Problem: Pain  Goal: Verbalizes/displays adequate comfort level or baseline comfort level  Outcome: Progressing     Problem: Discharge Planning  Goal: Discharge to home or other facility with appropriate resources  Outcome: Progressing

## 2022-11-05 NOTE — Progress Notes (Addendum)
[name]   [facility]  [phone]    Patient Name: [name]  Date of Birth: [birth year]    Date of Provider [name] Visit: 11/05/22    Level of Care:   [x]  General Inpatient (GIP)    []  Routine   []  Respite    Current Location of Care:  [x]  [facility] []  [facility] []  [facility] []  [facility] []  [name])    IF [facility], patient referred from:  []  [facility] []  [facility] []  [facility] []  [facility] []  Home []  Other:     Date of Original [name] Admission: 12-02-22  [name] Medical Director at time of admission: Dr. Jyl [name]    Principle [name] Diagnosis: ICH  Diagnoses RELATED to the terminal prognosis: Paroxysmal atrial fibrillation on oral anticoagulation  Other Diagnoses: Dementia, HLD, hypothyroidism     [name] SUMMARY     [name] is a 88 y.o. year old Caucasian male who was admitted to [facility].     HPI and course of hospitalization as summarized in PA [place]'s most recent progress note:  "This is an 88 year old man with a past medical history significant for dementia, paroxysmal atrial fibrillation on Eliquis, status post pacemaker insertion, dyslipidemia, and hypothyroidism, presented at the emergency room with change in mental status.  The patient resides at the memory unit at the assisted living facility.  The patient was noted to be in his bed and could not wake up.  The patient did not respond to painful stimuli.  EMS was called.  When the EMS arrived at the scene, it was reported that the patient has intermittent apnea and the patient was brought to the emergency room for further evaluation.  When the patient arrived at the emergency room, CT scan of the head shows large acute parenchymal hematoma in the left parietal lobe with mass effect.  The patient is not able to provide history.  History was obtained from the patient's niece, who was present at the bedside.  The niece was also the power of attorney.  The goal of care was discussed with the niece by the emergency room physician.   The patient's niece did not want any heroic measures and the patient has an active DNR status, which went into reset.  The patient was made comfort measures only status following the discussion between the emergency room physician and the patient's niece.  The patient received morphine in the emergency room and the patient was in the emergency room for couple of hours with expectation that the patient was going to expire.  When the patient did not expire despite being on comfort measures only in the emergency room, the patient was referred to the hospitalist service for admission.  I had further discussion with the patient's niece at the bedside about the comfort measures only and his status categorized as that is the treatment goal that is wanted for the patient.  The patient is being admitted to the hospital for continuation of comfort measures only status started in the emergency room.  There was no history of fall at the nursing home reported.  It was just stated that the patient was found on the bed and he was not responsive."        Interval history / Subjective:      Pt laying in bed. Demonstrating intermittent apnea. Nonverbal, not following commands. Discussion with nephew at bedside. Family would like comfort measures only and are interested in admission  to inpatient [name]. [name] nurse working on getting in contact with [name].       Assessment & Plan:         Left-sided nontraumatic intracerebral hemorrhage.  - per CT  - discussion with family and consult [name] team - likely dc to inpatient [name] tomorrow morning      Dementia.  Paroxysmal atrial fibrillation, on Eliquis for anticoagulation.  Status post pacemaker insertion.  Dyslipidemia.  Hypothyroidism.  - Comfort measures only      The patient's principle diagnosis has resulted in altered mental status increased work of breathing, and signs of pain.    Functionally, the patient's Karnofsky and/or Palliative Performance Scale has declined over a  period of days and is estimated at 10.     The patient is dependent on the following ADLs: all    Objective information that support this patients limited prognosis includes:   Narrative & Impression  Indication:  altered      Comparison: CT June 28, 2021     Findings: 5 mm axial images were obtained from the skull base through the  vertex.      CT dose reduction was achieved through the use of a standardized protocol  tailored for this examination and automatic exposure control for dose  modulation.     There is a large acute parenchymal hemorrhage in the left parietal lobe, with  associated mass effect and slight rightward midline shift. Total volume of  hemorrhage is 5.7 x 8 x 4.1 cm (93 CC). Rightward midline shift measures  approximately 6 mm. There is no hydrocephalus. There is a chronic right temporal  lobe infarct. There is no extra-axial collection. The skull is intact.     IMPRESSION:  1. Large acute parenchymal hematoma in the left parietal lobe, with mass effect  and 6 mm of rightward midline shift.  2. Chronic right temporal lobe infarction.  3. 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    The patient/family chose comfort measures with the support of [name].     [name] DIAGNOSES   Active Symptoms:  1. ICH  2. Altered mental status  3. Increased work of breathing  4. Signs of pain  5. Seizure prophylaxis  6. [name] care     PLAN   Continue GIP level of care as pt requires frequent nursing assessment and administration and titration of parenteral medications to manage symptom burden  Ativan 2 mg IV every 4 hours for restlessness and seizure prophylaxis; with prn dosing available every 15 minutes as needed for breakthrough symptoms  Morphine 2 mg IV every 4 hours for increased work of breathing and/or signs of pain; with prn dosing available every 15 minutes for breakthrough symptoms  All other symptom management medications as needed  Oxygen for  comfort  Place foley catheter to maintain bladder decompression; foley care per facility protocol  No family at bedside  Chaplain and SW to support family needs  Disposition: anticipate death in hospital  [name] Plan of care was reviewed in detail and agree with current plan of care    Prognosis estimated based on 11/05/22 clinical assessment is:   [x]  Hours to Days    []  Days to Weeks    []  Other:    Communicated plan of care with: [name] Case Manager; [name] IDT; Care Team     GOALS OF CARE     Patient/Medical POA stated Goal of Care: comfort care and symptom management    [  x] I have reviewed and/or updated ACP information in the Advance Care Planning Navigator. This information is available in the Adv Care Plan link in the patient's chart header.    Primary Decision Maker (Health Care Agent):     Resuscitation Status: DNR No additional code details  If DNR is there a Durable DNR on file? : []  Yes [x]  No (If no, complete Durable DNR)    HISTORY     History obtained from: chart review; discussion with liaison; discussion with nephew at bedside    CHIEF COMPLAINT: unable to obtain  The patient is:   []  Verbal  []  Nonverbal  [x]  Unresponsive    HPI/SUBJECTIVE:  at bedside during provider rounds. Pt observed in bed. He is unresponsive. Generalized pallor. Slight increased work of breathing with use of accessory muscles. No seizure activity observed.    11/05/2022--patient unresponsive.  No family at bedside.         REVIEW OF SYSTEMS     The following systems were: []  reviewed  [x]  unable to be reviewed    Positive ROS include:  Constitutional: fatigue, weakness, in pain, short of breath  Ears/nose/mouth/throat: increased airway secretions  Respiratory:shortness of breath, wheezing  Gastrointestinal:poor appetite, nausea, vomiting, abdominal pain, constipation, diarrhea  Musculoskeletal:pain, deformities, swelling legs  Neurologic:confusion, hallucinations, weakness  Psychiatric:anxiety, feeling  depressed, poor sleep  Endocrine:     Adult Non-Verbal Pain Assessment Score: 2    Face  [x]  0   No particular expression or smile  []  1   Occasional grimace, tearing, frowning, wrinkled forehead  []  2   Frequent grimace, tearing, frowning, wrinkled forehead    Activity (movement)  [x]  0   Lying quietly, normal position  []  1   Seeking attention through movement or slow, cautious movement  []  2   Restless, excessive activity and/or withdrawal reflexes    Guarding  [x]  0   Lying quietly, no positioning of hands over areas of body  []  1   Splinting areas of the body, tense  []  2   Rigid, stiff    Physiology (vital signs)  []  0   Stable vital signs  [x]  1   Change in any of the following: SBP > 35mm Hg; HR > 20/minute  []  2   Change in any of the following: SBP > 83mm Hg; HR > 25/minute    Respiratory  []  0   Baseline RR/SpO2, compliant with ventilator  [x]  1   RR > 10 above baseline, or 5% drop SpO2, mild asynchrony with ventilator  []  2   RR > 20 above baseline, or 10% drop SpO2, asynchrony with ventilator     FUNCTIONAL ASSESSMENT     Palliative Performance Scale (PPS): 10     PSYCHOSOCIAL/SPIRITUAL ASSESSMENT     Principal Problem:    Intracerebral hemorrhage of brainstem (HCC)  Resolved Problems:    * No resolved hospital problems. *    Past Medical History:   Diagnosis Date    Arrhythmia     Arthritis     Atrial fibrillation (HCC)     Chronic pain     RIGHT HIP    Dementia (HCC)     Hypercholesterolemia     Insomnia     Long term current use of anticoagulant therapy     Pacemaker     Stroke [facility])       Past Surgical History:   Procedure Laterality Date    PACEMAKER  PR ABDOMEN SURGERY PROC UNLISTED        Social History     Tobacco Use    Smoking status: Every Day    Smokeless tobacco: Never   Substance Use Topics    Alcohol use: Yes     Family History   Problem Relation Age of Onset    Heart Disease Brother     Heart Disease Father     Cancer Mother         BREAST CA      Allergies   Allergen Reactions     Fluarix Quadrivalent [Influenza Vac Split Quad]      Unknown       Current Facility-Administered Medications   Medication Dose Route Frequency    sodium chloride flush 0.9 % injection 5-40 mL  5-40 mL IntraVENous PRN    bisacodyl (DULCOLAX) suppository 10 mg  10 mg Rectal Daily PRN    ondansetron (ZOFRAN) injection 4 mg  4 mg IntraVENous Q6H PRN    glycopyrrolate (ROBINUL) injection 0.2 mg  0.2 mg IntraVENous Q4H PRN    LORazepam (ATIVAN) injection 2 mg  2 mg IntraVENous Q4H    LORazepam (ATIVAN) injection 2 mg  2 mg IntraVENous Q15 Min PRN    morphine (PF) injection 2 mg  2 mg IntraVENous Q15 Min PRN    morphine (PF) injection 2 mg  2 mg IntraVENous Q4H    ketorolac (TORADOL) injection 30 mg  30 mg IntraVENous Q6H PRN        PHYSICAL EXAM     Wt Readings from Last 3 Encounters:   08/09/21 82.6 kg (182 lb)   06/26/21 80.7 kg (178 lb)   05/28/21 86.8 kg (191 lb 6.4 oz)       Vitals:    11/05/22 1444   BP: (!) 149/61   Pulse: (!) 44   Resp: 17   Temp: 97.8 F (36.6 C)   SpO2: 97%       Supplemental O2  [x]  Yes  []  NO  Last bowel movement:     Currently this patient has:  [x]  Peripheral IV []  PICC  []  PORT []  ICD    []  Foley Catheter []  NG Tube   []  PEG Tube    []  Rectal Tube []  Drain  [x]  Other: pacemaker    Constitutional: unresponsive, resting in bed without facial grimacing or increased wob  Eyes: closed  ENMT: oral mucosa dry  Cardiovascular: irregular, no edema  Respiratory: no increased work of breathing;  scattered rhonchi  Gastrointestinal: no distention  Musculoskeletal: no gross deformities  Skin: thin/fragile; generalized pallor; feet warm without mottling  Neurologic: unresponsive  Psychiatric: unresponsive    Pertinent Lab and or Imaging Tests:  Lab Results   Component Value Date/Time    NA 140 11/02/2022 06:22 PM    K 4.3 11/02/2022 06:22 PM    CL 109 11/02/2022 06:22 PM    CO2 25 11/02/2022 06:22 PM    BUN 13 11/02/2022 06:22 PM    GFRAA >60 06/29/2021 03:36 AM     No results found for: "TP", "ALBR",  "ALB"        , APRN - NP

## 2022-11-05 NOTE — Plan of Care (Signed)
Problem: Chronic Conditions and Co-morbidities  Goal: Patient's chronic conditions and co-morbidity symptoms are monitored and maintained or improved  11/05/2022 2352 by Catalina Antigua, RN  Outcome: Not Progressing  Flowsheets (Taken 11/05/2022 2016)  Care Plan - Patient's Chronic Conditions and Co-Morbidity Symptoms are Monitored and Maintained or Improved: Monitor and assess patient's chronic conditions and comorbid symptoms for stability, deterioration, or improvement  11/05/2022 1936 by Derrill Center, LPN  Outcome: Not Progressing  Flowsheets (Taken 11/05/2022 0830)  Care Plan - Patient's Chronic Conditions and Co-Morbidity Symptoms are Monitored and Maintained or Improved: Monitor and assess patient's chronic conditions and comorbid symptoms for stability, deterioration, or improvement     Problem: Hospice Orientation  Goal: Demonstrate understanding of hospice philosophy, plan of care, and inpatient hospice program  Description: The patient/family/caregiver will demonstrate understanding of hospice philosophy, plan of care and the inpatient hospice program as evidenced by participation in meeting the patient's psychosocial, spiritual, medical, and physical needs inclusive of medical supplies/equipment focusing on symptoms.  Outcome: Not Progressing     Problem: Breathing Pattern - Ineffective  Goal: Use of effective breathing techniques  Description: Patient  will indicate an effective breathing pattern as evidenced by the absence of respiratory distress each shift during the inpatient hospice stay.    Outcome: Not Progressing     Problem: Chronic Conditions and Co-morbidities  Goal: Patient's chronic conditions and co-morbidity symptoms are monitored and maintained or improved  11/05/2022 2352 by Catalina Antigua, RN  Outcome: Not Progressing  Flowsheets (Taken 11/05/2022 2016)  Care Plan - Patient's Chronic Conditions and Co-Morbidity Symptoms are Monitored and Maintained or Improved: Monitor and  assess patient's chronic conditions and comorbid symptoms for stability, deterioration, or improvement  11/05/2022 1936 by Derrill Center, LPN  Outcome: Not Progressing  Flowsheets (Taken 11/05/2022 0830)  Care Plan - Patient's Chronic Conditions and Co-Morbidity Symptoms are Monitored and Maintained or Improved: Monitor and assess patient's chronic conditions and comorbid symptoms for stability, deterioration, or improvement     Problem: Hospice Orientation  Goal: Demonstrate understanding of hospice philosophy, plan of care, and inpatient hospice program  Description: The patient/family/caregiver will demonstrate understanding of hospice philosophy, plan of care and the inpatient hospice program as evidenced by participation in meeting the patient's psychosocial, spiritual, medical, and physical needs inclusive of medical supplies/equipment focusing on symptoms.  Outcome: Not Progressing     Problem: Breathing Pattern - Ineffective  Goal: Use of effective breathing techniques  Description: Patient  will indicate an effective breathing pattern as evidenced by the absence of respiratory distress each shift during the inpatient hospice stay.    Outcome: Not Progressing

## 2022-11-06 MED FILL — MORPHINE SULFATE 2 MG/ML IJ SOLN: 2 mg/mL | INTRAMUSCULAR | Qty: 1

## 2022-11-06 MED FILL — LORAZEPAM 2 MG/ML IJ SOLN: 2 MG/ML | INTRAMUSCULAR | Qty: 1

## 2022-11-06 MED FILL — KETOROLAC TROMETHAMINE 30 MG/ML IJ SOLN: 30 MG/ML | INTRAMUSCULAR | Qty: 1

## 2022-11-06 NOTE — Progress Notes (Signed)
[place] [facility]  [phone]    GIP Daily Nursing Note   Patient Name: [name]  Date of Birth: 01/18/1932  Age: 88 y.o.    Date of Visit: 11/06/22  Facility of Care: [facility]  Patient Room: [place] Attending: Marianna Payment, MD  Hospice Diagnosis: Intracerebral hemorrhage of brainstem [facility]) [I61.3]    Level of Care: GIP    Current GIP Symptoms    1. Dyspnea, nonverbal pain, restlessness    ASSESSMENT & PLAN   Must update Plan of Care including visit frequencies for IDT members  1. Nonverbal pain, dyspnea. Continue Morphine 2mg  IV every 4 hours and PRN.  2. Restlessness and seizure prophylaxis. Responding well to Ativan 2mg  IV every 4 hours and PRN. No seizure activity reported or observed.  3. Comfort order set for breakthrough symptoms.  4. Place Foley for urinary retention, bladder scan showed 648ml  5. Maintain skin integrity as tolerated for hospice patient, turning and repositioning for comfort, provide oral care, and specialty mattress if appropriate.  6. Provide education and support to unit staff caring for hospice patient and family regarding end of life care and Hospice plan of care. ?Provide staff with direct contact information to reach hospice team [phone]    7. Provide social worker support and Chaplain and bereavement support ongoing.   8. Support and educate family on end of life process. Give copy of GFMS.    Spiritual Interventions: Chaplain visits appreciated.    Psych/ Social/ Emotional Interventions: Pt's nephew [name] who lives locaally is at bedside. He is playing music his uncle would enjoy. 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 Pt's SIL Maryanne lives in [facility] and is 85yo. She was married to pt's brother who passed under hospice care one year ago. She wishes [name] to be called with updates    Care Coordination Needs: with [place], RN    Care plan and New Orders discussed / approved with Sheliah Plane,  MD.    Description History and Chart Review   If this is initial GIP note must document RN assessment/MD communication in previous setting. Specifically document nursing/medication needs in last 24 hours to support GIP care  Narrative History of last 24 hours that demonstrates care cannot be provided in another setting:  Requiring frequent nursing assessments to monitor for EOL symptoms and administration and titration of IV medications to manage EOL symptoms     What has been done to control the patient's symptoms in the last 24 hours?  Scheduled Morphine and Ativan IV every 4 hours with PRN available     Does the patient currently require IV medications? yes  Does the patient currently require scheduled medications? yes  Does the patient currently require a PCA? no    List number of doses of PRN medications in last 24 hours:  Medication 1: Toradol  Number of doses: 1    Medication 2:   Number of doses:    Medication 3:   Number of doses:    Supporting documentation for GIP need for pain control:  [x]  Frequent evaluation by a doctor, nurse practitioner, nurse   [x]  Frequent medication adjustment    [x]  IVs that cannot be administered at home   []  Aggressive pain management   []  Complicated technical delivery of medications              Supporting documentation  for GIP need for symptom control:  []   Sudden decline necessitating intensive nursing intervention  []   Uncontrolled / intractable nausea or vomiting   []   Pathological fractures  []   Advanced open wounds requiring frequent skilled care  []  Unmanageable respiratory distress  []  New or worsening delirium   []  Delirium with behavior issues: Is 24 hour caregiver present due to safety concerns with agitation? (yes/no)  [x]  Imminent death - with skilled nursing needs documented above    DISCHARGE PLANNING   Daily discharge planning required for GIP  1. Discharge Plan: remain inpatient as not stable for transfer to [facility]  2. Patient/Family teaching: signs and  symptoms of end-of-life, medications to manage.  3. Response to patient/family teaching: Family have good understanding of hospice process and agree with plan of care>    ASSESSMENT    KARNOFSKY: 10%    Prognosis estimated based on 11/06/22 clinical assessment is:   []  Few to Many Hours  [x]  Hours to Days   []  Few to Many Days   []  Days to Weeks   []  Few to Many Weeks   []  Weeks to Months   []  Few to Many Months    Quality Measure: Patient self-reports:  []  Yes    [x]  No    ESAS:   Time of Assessment: 10:00  Pain (1-10):6  Fatigue (1-10): 0  Shortness of breath (1-10):6  Nausea (1-10): 0  Appetite (1-10):   Anxiety: (1-10):   Depression: (1-10):   Well-being: (1-10):   Constipation: _ Yes  _ No  LAST BM: unknown    CLINICAL INFORMATION   Patient Vitals for the past 12 hrs:   Temp Pulse Resp BP SpO2   11/06/22 0915 100.3 F (37.9 C) 70 16 (!) 145/65 (!) 85 %   11/06/22 0903 100.2 F (37.9 C) 53 21 (!) 170/57 (!) 84 %   11/06/22 0800 -- -- -- -- (!) 80 %   11/06/22 0545 98 F (36.7 C) 55 20 (!) 163/70 90 %       Currently this patient has:  [x]  Supplemental O2   [x]  IV    []  PICC      []  PORT   []  NG Tube    []  PEG Tube   []  Ostomy     [x]  Foley draining __dark amber_____ urine  []  Other:     SIGNS/PHYSICAL FINDINGS     Skin (including wound):  []  Warm, dry, supple, intact and color normal for race  [x]  Warm   [x]  Dry   []  Cool     []  Clammy       []  Diaphoretic    Turgor   []  Normal   []  Decreased  Color:   []  Pink   []  Pale   []  Cyanotic   []  Erythema   []  Jaundice   []  Normal for Race  []   Wounds:    Neuro:  []  Lethargy  []  Restlessness / agitation  []  Confusion / delirium  []  Hallucinations  []  Responds to maximal stimulation  [x]  Unresponsive  []  Seizures     Cardiac:  []  Dyspnea on Exertion  []  JVD  []  Murmur  []  Palpitations  []  Hypotension  [x]  Hypertension  [x]  Tachycardia  []  Bradycardia  [x]  Irregular HR  []  Pulses Decreased  []  Pulses Absent  []  Edema:       (Location, Grade and Pitting)  []  Mottling:       (Location)  Respiratory:  Breath sounds:    [x]  Diminished   []  Wheeze   []  Rhonchi   []  Rales   [x]  Even and unlabored  []  Labored:            []  Cough   []  Non Productive   []  Productive    []  Description:           []  Deep suctioned   [x]  O2 at __2_ LPM  []  High flow oxygen greater than 10 LPM  []  Bi-Pap    GI  []  Abdomen (describe)   []  Ascites  []  Nausea  []  Vomiting  [x]  Incontinent of bowels  []  Bowel sounds (yes/no)  []  Diarrhea  []  Constipation (see above including last bowel movement)  []  Checked for impaction  [x]  Last BM unknown, prior to admission.    Nutrition  Diet:_____NPO_____  Appetite:   []  Good   []  Fair   []  Poor   []  Tube Feeding     GU  []  Voiding  []  Incontinent   [x]  Foley    Musculoskeletal  []  Balance/Galt Unsteady   []  Weak   Strength:    []  Normal    []  Limited    []  Decreasing   Activities:    []  Up as tolerated   [x]  Bedridden    []  Specify:    SAFETY  [x]  24 hr. Caregiver   [x]  Side rails ?    [x]  Hospital bed   [x]  Reviewed Falls & Safety       ALLERGIES AND MEDICATIONS     Allergies:   Allergies   Allergen Reactions    Fluarix Quadrivalent [Influenza Vac Split Quad]      Unknown        Current Facility-Administered Medications   Medication Dose Route Frequency    sodium chloride flush 0.9 % injection 5-40 mL  5-40 mL IntraVENous PRN    bisacodyl (DULCOLAX) suppository 10 mg  10 mg Rectal Daily PRN    ondansetron (ZOFRAN) injection 4 mg  4 mg IntraVENous Q6H PRN    glycopyrrolate (ROBINUL) injection 0.2 mg  0.2 mg IntraVENous Q4H PRN    LORazepam (ATIVAN) injection 2 mg  2 mg IntraVENous Q4H    LORazepam (ATIVAN) injection 2 mg  2 mg IntraVENous Q15 Min PRN    morphine (PF) injection 2 mg  2 mg IntraVENous Q15 Min PRN    morphine (PF) injection 2 mg  2 mg IntraVENous Q4H    ketorolac (TORADOL) injection 30 mg  30 mg IntraVENous Q6H PRN          Visit Time In: 10  Visit Time Out: 10:45 and throughout the day

## 2022-11-06 NOTE — Hospice (Signed)
11/06/2022 12:20:00 PM - Franco Nones [phone] Caller Eston Esters for Office/Care Team] IB call from [facility] calling on behalf of brotherBob D Henle. Caller would like to confirm patient was admitted to hospice services. This TRN confirms start of care of  11/21/22. Caller explains she is the [place] in [employer] and out of town and would like to make arrangements come in to town. Caller would like to know the status on how patient is doing as well as where he has been moved to in the hospital. Caller confirms cell home phone number is [phone]; if she can't be reached on home phone. Assured caller this message would be relayed ot the [facility] and follow up will be made shortly. Thanks exchanged; call ended. - Electronically signed by Elenor Legato RN  11/06/2022 12:28:36 - [Message] - Electronically signed by Elenor Legato RN  11/06/2022 12:42:12 - [MessageReminder] - Venita Lick RN Parthenia Tellefsen, Awaiting response for [place]ie[place], Zahmir : 11/06/2022 12:20:00 PM  - Please respond ASAP - Electronically signed by Elenor Legato RN  11/06/2022 12:49:48 - [MessageResponse] - [ACCEPT] I will follow-up and address this patient's needs. - Electronically signed by Venita Lick RN Arn Mcomber

## 2022-11-07 MED ORDER — HYDROMORPHONE HCL PF 1 MG/ML IJ SOLN
1 MG/ML | INTRAMUSCULAR | Status: DC | PRN
Start: 2022-11-07 — End: 2022-11-07

## 2022-11-07 MED ORDER — HYDROMORPHONE HCL PF 1 MG/ML IJ SOLN
1 MG/ML | INTRAMUSCULAR | Status: DC
Start: 2022-11-07 — End: 2022-11-07
  Administered 2022-11-07 – 2022-11-08 (×3): 1 mg via INTRAVENOUS

## 2022-11-07 MED ORDER — LORAZEPAM 2 MG/ML IJ SOLN
2 MG/ML | INTRAMUSCULAR | Status: DC
Start: 2022-11-07 — End: 2022-11-07
  Administered 2022-11-07 – 2022-11-08 (×3): 2 mg via INTRAVENOUS

## 2022-11-07 MED FILL — LORAZEPAM 2 MG/ML IJ SOLN: 2 MG/ML | INTRAMUSCULAR | Qty: 1

## 2022-11-07 MED FILL — HYDROMORPHONE HCL 1 MG/ML IJ SOLN: 1 MG/ML | INTRAMUSCULAR | Qty: 1

## 2022-11-07 MED FILL — MORPHINE SULFATE 2 MG/ML IJ SOLN: 2 mg/mL | INTRAMUSCULAR | Qty: 1

## 2022-11-07 MED FILL — BISACODYL LAXATIVE 10 MG RE SUPP: 10 MG | RECTAL | Qty: 1

## 2022-11-07 MED FILL — GLYCOPYRROLATE 0.2 MG/ML IJ SOLN: 0.2 MG/ML | INTRAMUSCULAR | Qty: 1

## 2022-11-07 NOTE — Progress Notes (Addendum)
This RN in to assess pt. Pt in semi-fowlers leaning to left side. No breathing apparent, verified with stethoscope. Pt is apneic, heart sounds heard are of the pacemaker firing.       2033: Palliative team notified.  2034Joellen Jersey with hospice team notified.   2037: NP sent perfect to verify who will pronounce death. Sarah NP to pronounce.  2044: Pts nephew [name] called to inform of pts passing. Dean requesting that Arthur Holms be made aware and stated that he will also call to give them a heads up.    2049: Supervisor Vernon aware of pts passing.   2109: Security informed pt needs transferred    2127: Pt transferred to [place], there are no belongings.

## 2022-11-07 NOTE — Progress Notes (Signed)
[place] [facility]  [phone]    Discharge/Death Nursing Note   Patient Name: [name]  Date of Birth: [birth year]  Age: 88 y.o.    Date of Death: November 22, 2022  Admitted Date: 11/19/22  Time of Death: April 03, 2026  Facility of Care: [facility]  Level of Care: GIP  Patient Room: [place]     Hospice Attending: Sherrie Mustache, MD  Hospice Diagnosis: Intracerebral hemorrhage of brainstem [facility]) [I61.3]    Death Pronouncement   Pronouncement of death completed by:     Gasper Sells    Agency staff was not present at the time of death    At the time of death the patient was documented as:    Patient was examined and the following were absent: Pulses, Blood Pressure, and Respiratory effort    The pt expired within 5W    The following were notified of the patient's death:   Dean/nephew    Medications were disposed of per facility protocol     Discharge Summary   Discharge Reason: Death    Summary of Care Provided:    [x]  Post mortem care provided by [facility] staff  [x]  Notification of funeral home by nursing supervisor  []  Referrals/Community resources provided:   []  Goals completed  []  Durable Medical Equipment vendor notified     Disciplines involved: [x]  RN []  SW [x]  Chaplain []  HA []  Vol []  PT []  OT []  ST []  BC    [x]  IDT communication/notification    Attending Physician, Dr. Marlaine Hind, notified of death    Bereaved            11/21/22     5:30 PM   Demographics   Marital Status Widowed

## 2022-11-07 NOTE — Other (Signed)
Death Pronouncement Note  Patient's Name: [name]   Patient's Date of Birth: 03-14-1932  MRN Number: [id number]    Admitting Provider: Marianna Payment, MD  Attending Provider: Marianna Payment, MD    Patient was examined and the following were absent: Pulses, Blood Pressure, and Respiratory effort    I declared the patient dead on  2023-12-01 at 11-Apr-2026    Preliminary Cause of Death:     Arnold Line     Electronically signed by Hollice Espy, APRN - NP on 11/30/2022 at 9:04 PM EST

## 2022-11-07 NOTE — Discharge Summary (Signed)
Hospice Discharge Summary    [facility]        Date of Admission: 11/08/2022  Date of Discharge: 2022-12-07    [name] is a 88 y.o. year old who was admitted to [facility] with a Hospice diagnosis of Intracerebral hemorrhage of brainstem ([place]) [I61.3].      The patient's care was focused on comfort and the patient passed away on 2022-12-07.

## 2022-11-07 NOTE — Progress Notes (Signed)
[place] Bereavement/Condolence Call:      This LMSW called pt's nephew [name] to offer condolences and support.  No answer.  LMSW left voicemail with contact information and offered [place] information for ongoing support.       Cleda Daub, MSW  [facility] Social Worker   [phone]

## 2022-11-07 NOTE — Progress Notes (Signed)
[facility]  [phone]    Patient Name: [name]  Date of Birth: May 02, 1932    Date of Provider Hospice Visit: 12/04/22    Level of Care:   [x]  General Inpatient (GIP)    []  Routine   []  Respite    Current Location of Care:  [x]  [facility] []  [facility] []  [facility] []  [facility] []  [facility])    IF [facility], patient referred from:  []  [place] []  [facility] []  [facility] []  [facility] []  Home []  Other:     Date of Original Hospice Admission: 11/13/2022  Hospice Medical Director at time of admission: Dr. Sherrie Mustache    Principle Hospice Diagnosis: ICH  Diagnoses RELATED to the terminal prognosis: Paroxysmal atrial fibrillation on oral anticoagulation  Other Diagnoses: Dementia, HLD, hypothyroidism     HOSPICE SUMMARY     [name] is a 88 y.o. year old Caucasian male who was admitted to [facility].     HPI and course of hospitalization as summarized in [facility]'s most recent progress note:  "This is an 88 year old man with a past medical history significant for dementia, paroxysmal atrial fibrillation on Eliquis, status post pacemaker insertion, dyslipidemia, and hypothyroidism, presented at the emergency room with change in mental status.  The patient resides at the memory unit at the assisted living facility.  The patient was noted to be in his bed and could not wake up.  The patient did not respond to painful stimuli.  EMS was called.  When the EMS arrived at the scene, it was reported that the patient has intermittent apnea and the patient was brought to the emergency room for further evaluation.  When the patient arrived at the emergency room, CT scan of the head shows large acute parenchymal hematoma in the left parietal lobe with mass effect.  The patient is not able to provide history.  History was obtained from the patient's niece, who was present at the bedside.  The niece was also the [name] of attorney.  The goal of care was discussed with the niece by the emergency room physician.   The patient's niece did not want any heroic measures and the patient has an active DNR status, which went into reset.  The patient was made comfort measures only status following the discussion between the emergency room physician and the patient's niece.  The patient received morphine in the emergency room and the patient was in the emergency room for couple of hours with expectation that the patient was going to expire.  When the patient did not expire despite being on comfort measures only in the emergency room, the patient was referred to the hospitalist service for admission.  I had further discussion with the patient's niece at the bedside about the comfort measures only and his status categorized as that is the treatment goal that is wanted for the patient.  The patient is being admitted to the hospital for continuation of comfort measures only status started in the emergency room.  There was no history of fall at the nursing home reported.  It was just stated that the patient was found on the bed and he was not responsive."        Interval history / Subjective:      Pt laying in bed. Demonstrating intermittent apnea. Nonverbal, not following commands. Discussion with nephew at bedside. Family would like comfort measures only and are interested in admission  to inpatient hospice. Hospice nurse working on getting in contact with [facility].       Assessment & Plan:         Left-sided nontraumatic intracerebral hemorrhage.  - per CT  - discussion with family and consult hospice team - likely dc to inpatient hospice tomorrow morning      Dementia.  Paroxysmal atrial fibrillation, on Eliquis for anticoagulation.  Status post pacemaker insertion.  Dyslipidemia.  Hypothyroidism.  - Comfort measures only      The patient's principle diagnosis has resulted in altered mental status increased work of breathing, and signs of pain.    Functionally, the patient's Karnofsky and/or Palliative Performance Scale has declined over a  period of days and is estimated at 10.     The patient is dependent on the following ADLs: all    Objective information that support this patients limited prognosis includes:   Narrative & Impression  Indication:  altered      Comparison: CT June 28, 2021     Findings: 5 mm axial images were obtained from the skull base through the  vertex.      CT dose reduction was achieved through the use of a standardized protocol  tailored for this examination and automatic exposure control for dose  modulation.     There is a large acute parenchymal hemorrhage in the left parietal lobe, with  associated mass effect and slight rightward midline shift. Total volume of  hemorrhage is 5.7 x 8 x 4.1 cm (93 CC). Rightward midline shift measures  approximately 6 mm. There is no hydrocephalus. There is a chronic right temporal  lobe infarct. There is no extra-axial collection. The skull is intact.     IMPRESSION:  1. Large acute parenchymal hematoma in the left parietal lobe, with mass effect  and 6 mm of rightward midline shift.  2. Chronic right temporal lobe infarction.  3. 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    The patient/family chose comfort measures with the support of Hospice.     HOSPICE DIAGNOSES   Active Symptoms:  1. ICH  2. Altered mental status  3. Increased work of breathing  4. Signs of pain  5. Seizure prophylaxis  6. Hospice care     PLAN   Continue GIP level of care as pt requires frequent nursing assessment and administration and titration of parenteral medications to manage symptom burden.  Patient was having restlessness and respiratory distress earlier today and was switched to dilaudid with better symptom control.    Ativan 2 mg IV every 3 hours for restlessness and seizure prophylaxis; with prn dosing available every 15 minutes as needed for breakthrough symptoms  Dilaudid 1 mg IV every 3 hours for increased work of breathing and/or signs of pain;  with prn dosing available every 15 minutes for breakthrough symptoms  All other symptom management medications as needed  Oxygen for comfort  Place foley catheter to maintain bladder decompression; foley care per facility protocol  No family at bedside  Chaplain and SW to support family needs  Disposition: anticipate death in hospital  Hospice Plan of care was reviewed in detail and agree with current plan of care    Prognosis estimated based on 11/01/2022 clinical assessment is:   [x]  Hours to Days    []  Days to Weeks    []  Other:    Communicated plan of care with: Hospice Case Manager; Hospice IDT; Care Team  GOALS OF CARE     Patient/Medical POA stated Goal of Care: comfort care and symptom management    [x]  I have reviewed and/or updated ACP information in the Advance Care Planning Navigator. This information is available in the Adv Care Plan link in the patient's chart header.    Primary Decision Maker (Health Care Agent):     Resuscitation Status: DNR No additional code details  If DNR is there a Durable DNR on file? : []  Yes [x]  No (If no, complete Durable DNR)    HISTORY     History obtained from: chart review; discussion with liaison; discussion with nephew at bedside    CHIEF COMPLAINT: unable to obtain  The patient is:   []  Verbal  []  Nonverbal  [x]  Unresponsive    HPI/SUBJECTIVE:  at bedside during provider rounds. Pt observed in bed. He is unresponsive. Generalized pallor. Slight increased work of breathing with use of accessory muscles. No seizure activity observed.    11/05/2022--patient unresponsive.  No family at bedside.      10/30/2022--patient with respiratory distress and restlessness earlier today with switch of morphine to dilaudid and has good symptom control on rounds.  No family at bedside.         REVIEW OF SYSTEMS     The following systems were: []  reviewed  [x]  unable to be reviewed    Positive ROS include:  Constitutional: fatigue, weakness, in pain, short of  breath  Ears/nose/mouth/throat: increased airway secretions  Respiratory:shortness of breath, wheezing  Gastrointestinal:poor appetite, nausea, vomiting, abdominal pain, constipation, diarrhea  Musculoskeletal:pain, deformities, swelling legs  Neurologic:confusion, hallucinations, weakness  Psychiatric:anxiety, feeling depressed, poor sleep  Endocrine:     Adult Non-Verbal Pain Assessment Score: 2    Face  [x]  0   No particular expression or smile  []  1   Occasional grimace, tearing, frowning, wrinkled forehead  []  2   Frequent grimace, tearing, frowning, wrinkled forehead    Activity (movement)  [x]  0   Lying quietly, normal position  []  1   Seeking attention through movement or slow, cautious movement  []  2   Restless, excessive activity and/or withdrawal reflexes    Guarding  [x]  0   Lying quietly, no positioning of hands over areas of body  []  1   Splinting areas of the body, tense  []  2   Rigid, stiff    Physiology (vital signs)  []  0   Stable vital signs  [x]  1   Change in any of the following: SBP > 30mm Hg; HR > 20/minute  []  2   Change in any of the following: SBP > 52mm Hg; HR > 25/minute    Respiratory  []  0   Baseline RR/SpO2, compliant with ventilator  [x]  1   RR > 10 above baseline, or 5% drop SpO2, mild asynchrony with ventilator  []  2   RR > 20 above baseline, or 10% drop SpO2, asynchrony with ventilator     FUNCTIONAL ASSESSMENT     Palliative Performance Scale (PPS): 10     PSYCHOSOCIAL/SPIRITUAL ASSESSMENT     Principal Problem:    Intracerebral hemorrhage of brainstem (HCC)  Resolved Problems:    * No resolved hospital problems. *    Past Medical History:   Diagnosis Date    Arrhythmia     Arthritis     Atrial fibrillation (HCC)     Chronic pain     RIGHT HIP    Dementia (HCC)  Hypercholesterolemia     Insomnia     Long term current use of anticoagulant therapy     Pacemaker     Stroke [facility](HCC)       Past Surgical History:   Procedure Laterality Date    PACEMAKER      PR ABDOMEN SURGERY PROC  UNLISTED        Social History     Tobacco Use    Smoking status: Every Day    Smokeless tobacco: Never   Substance Use Topics    Alcohol use: Yes     Family History   Problem Relation Age of Onset    Heart Disease Brother     Heart Disease Father     Cancer Mother         BREAST CA      Allergies   Allergen Reactions    Fluarix Quadrivalent [Influenza Vac Split Quad]      Unknown       Current Facility-Administered Medications   Medication Dose Route Frequency    HYDROmorphone HCl PF (DILAUDID) injection 1 mg  1 mg IntraVENous Q3H    HYDROmorphone HCl PF (DILAUDID) injection 1 mg  1 mg IntraVENous Q15 Min PRN    LORazepam (ATIVAN) injection 2 mg  2 mg IntraVENous Q3H    sodium chloride flush 0.9 % injection 5-40 mL  5-40 mL IntraVENous PRN    bisacodyl (DULCOLAX) suppository 10 mg  10 mg Rectal Daily PRN    ondansetron (ZOFRAN) injection 4 mg  4 mg IntraVENous Q6H PRN    glycopyrrolate (ROBINUL) injection 0.2 mg  0.2 mg IntraVENous Q4H PRN    LORazepam (ATIVAN) injection 2 mg  2 mg IntraVENous Q15 Min PRN    ketorolac (TORADOL) injection 30 mg  30 mg IntraVENous Q6H PRN        PHYSICAL EXAM     Wt Readings from Last 3 Encounters:   08/09/21 82.6 kg (182 lb)   06/26/21 80.7 kg (178 lb)   05/28/21 86.8 kg (191 lb 6.4 oz)       Vitals:    10/31/2022 1215   BP: (!) 151/63   Pulse:    Resp:    Temp:    SpO2:        Supplemental O2  [x]  Yes  []  NO  Last bowel movement:     Currently this patient has:  [x]  Peripheral IV []  PICC  []  PORT []  ICD    []  Foley Catheter []  NG Tube   []  PEG Tube    []  Rectal Tube []  Drain  [x]  Other: pacemaker    Constitutional: unresponsive, resting in bed without facial grimacing or increased wob  Eyes: closed  ENMT: oral mucosa dry  Cardiovascular: irregular, no edema  Respiratory: no increased work of breathing;  scattered rhonchi  Gastrointestinal: no distention  Musculoskeletal: no gross deformities  Skin: thin/fragile; generalized pallor; feet warm without mottling  Neurologic:  unresponsive  Psychiatric: unresponsive    Pertinent Lab and or Imaging Tests:  Lab Results   Component Value Date/Time    NA 140 11/02/2022 06:22 PM    K 4.3 11/02/2022 06:22 PM    CL 109 11/02/2022 06:22 PM    CO2 25 11/02/2022 06:22 PM    BUN 13 11/02/2022 06:22 PM    GFRAA >60 06/29/2021 03:36 AM     No results found for: "TP", "ALBR", "ALB"        Annice NeedyLynn Zandra Lajeunesse, APRN - NP

## 2022-11-07 NOTE — Progress Notes (Signed)
[facility]  Good Help to Those in Need  [phone]    Social Work Admission Note  Patient Name: [name]  Date of Birth: 14-Apr-1932  Age: 88 y.o.    Date of Visit: 11/13/22  Facility of Care: [facility]  Patient Room: 540/02     Hospice Attending: Ned Clines, MD  Hospice Diagnosis: Intracerebral hemorrhage of brainstem [facility]) [I61.3]    Level of Care:    [x]   GIP    []   Respite   []   Routine    Consents/NCD Documentation:     Consents Reviewed:   []   Yes  [x]   No, completed by other hospice staff member.    Person Reviewed/Signed with:  []   Patient   []   Pts NOK/MPOA  Name:     Right to NCD Reviewed:   []   Yes  []   No, completed by other hospice staff member.    NCD Requested:   []   Yes  []   No    Admission Nurse/Intake Notified NCD was requested:  []   Yes  []   No  []   Not requested    Planned Start of Care Date:     Hospice Witness Representative:    NARRATIVE   This LCSW visited the room of pt, who is unresponsive. No family was present. Pts sister Khameron Gruenwald is MPOA, she lives in [place]. When LCSW visited the room, nephew August Saucer had already left. Pt is an 88 y/o CM with a hospice diagnosis of ICH. Pt has multiple comorbidites including hx of CVA and dementia. 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 LCSW visited the room of pt, provided a supportive presence and a prayer of peace and comfort. LCSW spoke to pts nephew Public house manager via phone. LCSW and nephew talked about pts care and comfort. Nephew reported he is at peace. LCSW provided reassurance. LCSW will continue to assess and monitor pt and family needs.     ADVANCE CARE PLANNING    Advance Directive:  [x]   Yes  []   No   []   Would like to complete  Primary MPOA:Maryanne Knoche  Secondary MPOA:   LNOK: Joaquim Nam   Code Status: ddnr   Durable DNR: X_ Yes  _ No      11/06/2022     5:30 PM   Demographics   Marital Status Widowed       Relationship Status:  []   Single     []   Married      []   Divorced    []   Domestic Partner     [x]    Widow/Widower  []   Common Law  []   Separated  []   Unknown    If in a relationship, name of partner/spouse:  Duration of relationship:    Religion/Spirituality: Baptist  []   Active In Religion/Spirituality   []   Not Active   []   N/A  Notes:    Funeral Home:   Resources Provided:  []   LINC  []   Information on applying for disability  []   FMLA Paperwork  []   Letters Requested by Family   []   Long Term Care Policy  []   Final Arrangements Resources   []   Outside counseling services (individual or group therapy)  []   Grief resources   []   [employer]  []   Veterans Resources   []   Personal Care Agencies   []   Gone From My Sight   []   Referral  Sent to [employer]     Referral Sent to Music Therapy     Referral Sent to Pet Therapy    Other support  Social Work Initial Assessment     Sex:  male    Pronoun Preference:     He/Him/His     She/Her/Hers     They/Them/Theirs      Patient Name      Decline to Answer     Unknown     Other   Notes:     Race/Ethnicity: (mark all that apply)    American [place] or [place] Native    Asian    Black or African American    Hispanic or Latino    Native [place] or Pacific Islander    White    Unknown    Other     Inpatient Financial Agreement Completed:     Yes    No    Insurance:     Medicare     Medicaid       Private Insurance      Self-Pay/Uninsured   Notes:      Has pt applied for FAP?    Needs to Apply    Application Completed and Submitted     Approved/ Expiration Date:     N/A  Notes:     Has pt applied for Medicaid?    Needs to Apply    Application Completed and Submitted/Application Number:     N/A  Notes:     Has a long term care screening ([facility]) been requested?    Requested     Not Requested, Needs follow up    Completed    N/A  Notes:     Does the pt have a long term care insurance policy?    Yes    No    Yes, Needing assistance with paperwork  Notes: unknown     Military Service:       Yes     No         Unknown    Appropriate for Pinning Ceremony:     Yes        No    Is patient using VA benefits?     Yes        No    Needs assistance with accessing benefits  Notes: unknown       Work History:     Full-Time/Part-Time    Retired     Other  Notes:     Primary Language: English    Interpreter Needed    Interpreter utilized during visit    Dance movement psychotherapist form completed    Ability to express thoughts/needs/feelings    Expressed thoughts/feelings/needs without difficulty    Requires extra time and cuing    Speech limited single words    Uses only gestures (eye, blinking eye or head movement/pointing)    Unable to express thoughts/feelings/needs (speech unintelligible or inappropriate)    Unresponsive  Notes:      Mental Status:    Alert-oriented to:       Person       Place       Time    Comatose-responds to:       Verbal stimuli      Tactile stimuli      Painful stimuli    Forgetful    Disoriented/Confused    Lethargic    Agitated    Unresponsive    Other (specify):  Notes:      Patient's description of Illness/Current Health Status:    [x]   Patient unable to discuss   []   Patient unwilling to discuss  []   (Specify)        Knowledge/Understanding of Disease Process  Patient:    []   Demonstrates knowledge/understanding of disease process   []   Demonstrates knowledge/understanding of treatment plan   []   Demonstrates knowledge/understanding of prognosis   []   Demonstrates acceptance of prognosis   []   Demonstrates knowledge/understanding of resuscitation status   [x]   Other (specify)Unresponsive    Caregiver:   [x]   Demonstrates knowledge/understanding of disease process   [x]   Demonstrates knowledge/understanding of treatment plan   [x]   Demonstrates knowledge/understanding of prognosis   [x]   Demonstrates acceptance of prognosis   [x]   Demonstrates knowledge/understanding of resuscitation status   []   Other (specify)  Notes:       Patient's living arrangement/care setting:  Use the PRIOR COLUMN when the PATIENT'S current health status necessitated a change in his/her primary residence.    Prior Current Response               X            []     Patients own home/residence              []              []     Home of family member/friend              []              []     Boarding home/Group Home              []              []     Assisted living facility/retirement center              []              []     Hospital/Acute care facility              []              []     Skilled nursing facility              []              []     Long term care facility/Nursing home              []              [x]     Hospice in Patient      Primary Caregiver:  []   No Primary Caregiver  Name of Primary Caregiver: and   Primary Caregiver Phone Number: [phone]  Relationship or Primary Caregiver:    []   Spouse/Significant other       []   Child        []   Step child       [x]   Sibling   []   Parent   []   Friend/Neighbor   []   Community/Church Volunteer   []   Paid help   []   Other (specify):_________  Notes:       Family members/Significant others:  Name:   Relationship: nephew    Phone Number:[phone]  Actively involved in care?  [x]   Yes  []   No    Name:  Relationship:  Phone Number:  Actively involved  in care?  []   Yes  []   No    Social support systems: (select ONE best description)  [x]   Excellent social support system which includes three or more family members or friends  []   Good social support system which includes two or less members or friends  []   Fair social support which includes one family member or friend  []   Poor social support; no family members or friends; basically ALONE  Notes:      Emotional Status: (mark all that apply)    Patient Caregiver Response                 [x]                 [x]     Mood/Affect stable and appropriate                   []                 []     Angry                 []                 []      Anxious                 []                 []     Apprehensive                 []                 []     Avoidant                 []                 []     Clinging                 []                 []     Depressed                 []                 []     Distraught                 []                 []     Fearful                 []                 []     Flat Affect                 []                 []     Helpless                 []                 []     Hostile                 []                 []     Impulsive                 []                 []   Irritable                                     Labile                                     Manic                                     Restlessness                                     Sad                                     Strain/Stress                                     Suspicious                                      Tearful                                       Withdrawn          Notes:     Coping Skills (strengths/weakness):    Patient: Coping Skills (strength/weakness): Unresponsive     Family/caregiver (strength/weakness): Accepting, at peace,  well supported.     Burden of care upon discharge (mark all that apply):       No burden evident     Family must administer medications     Illness causing financial strain     Family/Support feels overwhelmed     Family/Support sleep disturbed with patient's care     Patient's care causes extra physical stress  of death    Illness causes changes in family lifestyle    Illness impacting family/support employment    Family experiencing increased time demands    Patient's behavior endangers family    Denial of patient's illness    Concern over outcome of illness/fear    Patient's behavior embarrassing to family   Notes:      Risk Factors: (mark all that apply):      No burden evident     Alcohol abuse    Financial resources inadequate to meet basic needs  (food/house/etc)    Financial resources inadequate to meet health care needs (supplies/equipment/medications)    Food/nutrition resources inadequate    Home environment unsafe/inadequate for home care    Homicidal risk    Lives alone or without concerned relatives    Multiple medications/complex schedule    Physical limitations increase likelihood of falls    Plan of care/treatments complicated    Substance use/abuse    Suicidal risk    Visual impairment threatens safety/ability to perform self-care    Other (specify):     Abuse/Neglect (actual/potential risks):    No signs of  abuse/neglect  []   History of abuse/neglect                 []   Physical          []   Sexual  []   History of domestic violence  []   Lacks adequate physical care  []   Lacks emotional nurturing/support  []   Lacks appropriate stimulation/cognitive experiences  []   Left alone inappropriately  []   Lacks necessary supervision  []   Inadequate or delayed medical care  []   Unsafe environment (i.e guns/drug use/history of violence in the home/etc.)  []   Bruising or other physical signs of injury present  []   Refer to child/adult protective services  []   Other (specify):   Notes:      Current Sources of Stress (in Addition to Current Illness):   [x]   None reported  []   Bills/Debt    []   Career/Job change    []   Child care (short term)    []   Child care (long term)    []   Death of a child (recent)    []   Death of a parent (recent)   []   Death of a spouse (recent)   []   Employment status changed   []   Family discord    []   (specify):come  []   Job loss  []   Legal issues unresolved  []   Lifestyle change  []   Marital discord  []   Marriage within the last year  []   Paperwork (insurance/legal/etc) overwhelming  []   Separation/Divorce  []   Other (specify):  Notes:       Interventions/Plan of Care   []   MSW will assess social and emotional factors related to coping with end of life issues  []   MSW will  provide community resource planning/referral   []   MSW to assist with relocation to different care setting if/when symptoms stabilize  [x]   Pt/Pts family will receive emotional support.  []   Pt/Pts family will share the details surrounding the pts disease progression  []   Pt/Pts Family will show expression of grief by participating in life review.  []   Pt/Pts Family will be educated and able to verbalize understanding of mental, emotional, and physical symptoms of grief.   []   Pt/Pts Family will be educated and able to identify skills and social support to help cope with grief.  []   Pts family will receive support for grief with emphasis on developmentally appropriate language.  []  Other:   Notes:       Discharge Planning   []   Home with family member    []   Return back to nursing home/facility  []   Needs assistance with placement/paid caregivers  []   Short Stay under routine level of care at [facility]   [x]   Other pt will likely pass at [facility]   Notes:     MSW Assessment Completed by: , LCSW  10/19/2022    Time In: 1:15 pm      Time Out:2:15 pm

## 2022-11-07 NOTE — Progress Notes (Addendum)
[facility]  Good Help to Those in Need  [phone]    GIP Daily Nursing Note   Patient Name: [name]  Date of Birth: 10-May-1932  Age: 88 y.o.    Date of Visit: November 18, 2022  Facility of Care: [facility]  Patient Room: 540/02     Hospice Attending: Ned Clines, MD  Hospice Diagnosis: Intracerebral hemorrhage of brainstem [facility]) [I61.3]    Level of Care: GIP    Current GIP Symptoms    1. Dyspnea, nonverbal pain, restlessness    ASSESSMENT & PLAN   Must update Plan of Care including visit frequencies for IDT members  1. Nonverbal pain, dyspnea. Start Dilaudid 1mg  IV every 3 hours and PRN.  2. Restlessness and seizure prophylaxis. Change Ativan 2mg  IV to every 3 hours and PRN. No seizure activity reported or observed.  3. Comfort order set for breakthrough symptoms.  4. Foley catheter care per hospital protocol.  5. Maintain skin integrity as tolerated for hospice patient, turning and repositioning for comfort, provide oral care, and specialty mattress if appropriate.  6. Provide education and support to unit staff caring for hospice patient and family regarding end of life care and Hospice plan of care. ?Provide staff with direct contact information to reach hospice team [phone]    7. Provide social worker support and Chaplain and bereavement support ongoing.   8. Support and educate family on end of life process. Comfort cart ordered.    Spiritual Interventions: Chaplain visits appreciated.    Psych/ Social/ Emotional Interventions: Pt's nephew who lives locally is at bedside. He is playing music for patient. He will update his mother, [phone] who lives in [place]. Dean to be called with updates    Care Coordination Needs: with Macon Large, RN    Care plan and New Orders discussed / approved with [place], NP      Description History and Chart Review   If this is initial GIP note must document RN assessment/MD communication in previous setting. Specifically document nursing/medication  needs in last 24 hours to support GIP care  Narrative History of last 24 hours that demonstrates care cannot be provided in another setting:  Requiring frequent nursing assessments to monitor for EOL symptoms and administration and titration of IV medications to manage EOL symptoms     What has been done to control the patient's symptoms in the last 24 hours?  Scheduled Morphine and Ativan IV every 4 hours with PRN available     Does the patient currently require IV medications? yes  Does the patient currently require scheduled medications? yes  Does the patient currently require a PCA? no    List number of doses of PRN medications in last 24 hours:  Medication 1: Morphine  Number of doses: 1    Medication 2: Dulcolax  Number of doses: 1    Medication 3:   Number of doses:    Supporting documentation for GIP need for pain control:  [x]  Frequent evaluation by a doctor, nurse practitioner, nurse   [x]  Frequent medication adjustment    [x]  IVs that cannot be administered at home   []  Aggressive pain management   []  Complicated technical delivery of medications              Supporting documentation for GIP need for symptom control:  []   Sudden decline necessitating intensive nursing intervention  []   Uncontrolled / intractable nausea or vomiting   []   Pathological fractures  []   Advanced open wounds requiring frequent skilled care  []  Unmanageable respiratory distress  []  New or worsening delirium   []  Delirium with behavior issues: Is 24 hour caregiver present due to safety concerns with agitation? (yes/no)  [x]  Imminent death - with skilled nursing needs documented above    DISCHARGE PLANNING   Daily discharge planning required for GIP  1. Discharge Plan: remain inpatient as not stable for transfer to [facility]  2. Patient/Family teaching: signs and symptoms of end-of-life, medications to manage.  3. Response to patient/family teaching: Family have good understanding of hospice process and agree with plan of  care.    ASSESSMENT    KARNOFSKY: 10%    Prognosis estimated based on 11/21/2022 clinical assessment is:   []  Few to Many Hours  [x]  Hours to Days   []  Few to Many Days   []  Days to Weeks   []  Few to Many Weeks   []  Weeks to Months   []  Few to Many Months    Quality Measure: Patient self-reports:  []  Yes    [x]  No    ESAS:   Time of Assessment: 10:00  Pain (1-10):6  Fatigue (1-10): 0  Shortness of breath (1-10):6  Nausea (1-10): 0  Appetite (1-10):   Anxiety: (1-10):   Depression: (1-10):   Well-being: (1-10):   Constipation: _ Yes  _ No  LAST BM: unknown    CLINICAL INFORMATION   Patient Vitals for the past 12 hrs:   Temp Pulse Resp BP SpO2   November 21, 2022 1215 -- -- -- (!) 151/63 --   11-21-22 0955 -- 87 -- (!) 179/69 --   11-21-22 0742 98.4 F (36.9 C) 63 15 (!) 192/82 (!) 88 %   11-21-22 0600 98.2 F (36.8 C) 63 14 (!) 156/83 (!) 87 %   2022/11/21 0245 -- -- 13 -- --         Currently this patient has:  [x]  Supplemental O2   [x]  IV    []  PICC      []  PORT   []  NG Tube    []  PEG Tube   []  Ostomy     [x]  Foley draining __dark amber_____ urine  []  Other:     SIGNS/PHYSICAL FINDINGS     Skin (including wound):  []  Warm, dry, supple, intact and color normal for race  []  Warm   [x]  Dry   [x]  Cool     []  Clammy       []  Diaphoretic    Turgor   []  Normal   [x]  Decreased  Color:   []  Pink   []  Pale   []  Cyanotic   []  Erythema   []  Jaundice   []  Normal for Race  []   Wounds:    Neuro:  []  Lethargy  []  Restlessness / agitation  []  Confusion / delirium  []  Hallucinations  []  Responds to maximal stimulation  [x]  Unresponsive  []  Seizures     Cardiac:  []  Dyspnea on Exertion  []  JVD  []  Murmur  []  Palpitations  []  Hypotension  [x]  Hypertension  [x]  Tachycardia  []  Bradycardia  [x]  Irregular HR  []  Pulses Decreased  []  Pulses Absent  []  Edema:       (Location, Grade and Pitting)  []  Mottling:      (Location)    Respiratory:  Breath sounds:    [x]  Diminished   []  Wheeze   []  Rhonchi   []  Rales   []  Even and unlabored  [  x] Labored:             []  Cough   []  Non Productive   []  Productive    []  Description:           []  Deep suctioned   []  O2 at __ LPM  []  High flow oxygen greater than 10 LPM  []  Bi-Pap    GI  []  Abdomen (describe)   []  Ascites  []  Nausea  []  Vomiting  [x]  Incontinent of bowels  []  Bowel sounds (yes/no)  []  Diarrhea  []  Constipation (see above including last bowel movement)  [x]  Checked for impaction  [x]  Last BM unknown, prior to admission.    Nutrition  Diet:_____NPO_____  Appetite:   []  Good   []  Fair   []  Poor   []  Tube Feeding     GU  []  Voiding  []  Incontinent   [x]  Foley    Musculoskeletal  []  Balance/Galt Unsteady   []  Weak   Strength:    []  Normal    []  Limited    []  Decreasing   Activities:    []  Up as tolerated   [x]  Bedridden    []  Specify:    SAFETY  [x]  24 hr. Caregiver   [x]  Side rails ?    [x]  Hospital bed   [x]  Reviewed Falls & Safety       ALLERGIES AND MEDICATIONS     Allergies:   Allergies   Allergen Reactions    Fluarix Quadrivalent [Influenza Vac Split Quad]      Unknown        Current Facility-Administered Medications   Medication Dose Route Frequency    HYDROmorphone HCl PF (DILAUDID) injection 1 mg  1 mg IntraVENous Q3H    HYDROmorphone HCl PF (DILAUDID) injection 1 mg  1 mg IntraVENous Q15 Min PRN    LORazepam (ATIVAN) injection 2 mg  2 mg IntraVENous Q3H    sodium chloride flush 0.9 % injection 5-40 mL  5-40 mL IntraVENous PRN    bisacodyl (DULCOLAX) suppository 10 mg  10 mg Rectal Daily PRN    ondansetron (ZOFRAN) injection 4 mg  4 mg IntraVENous Q6H PRN    glycopyrrolate (ROBINUL) injection 0.2 mg  0.2 mg IntraVENous Q4H PRN    LORazepam (ATIVAN) injection 2 mg  2 mg IntraVENous Q15 Min PRN    ketorolac (TORADOL) injection 30 mg  30 mg IntraVENous Q6H PRN          Visit Time In: 0900  Visit Time Out: 10:00 and throughout the day

## 2022-11-07 NOTE — Progress Notes (Signed)
TThis was an initial visit to assess needs and offer support. Pt was in bed and appeared to be resting comfortably. There was no family present. [employer] of presence. Followed up call make to his nephew, he notes they are well doing the best they can. Assured him of our care and continued prayer support. He affirmed the prayers and al the care we are giving to keep him comfort.  Assured him we will keep in touch and encouraged them to call as needed/desired. Thank you for the opportunity to minister to this family.

## 2022-11-07 NOTE — Plan of Care (Signed)
Problem: Hospice Orientation  Goal: Demonstrate understanding of hospice philosophy, plan of care, and inpatient hospice program  Description: The patient/family/caregiver will demonstrate understanding of hospice philosophy, plan of care and the inpatient hospice program as evidenced by participation in meeting the patient's psychosocial, spiritual, medical, and physical needs inclusive of medical supplies/equipment focusing on symptoms.  Outcome: Progressing     Problem: Discharge Planning  Goal: Discharge to home or other facility with appropriate resources  Outcome: Not Progressing

## 2022-11-08 MED FILL — LORAZEPAM 2 MG/ML IJ SOLN: 2 MG/ML | INTRAMUSCULAR | Qty: 1

## 2022-11-08 MED FILL — HYDROMORPHONE HCL 1 MG/ML IJ SOLN: 1 MG/ML | INTRAMUSCULAR | Qty: 1

## 2022-11-08 NOTE — Progress Notes (Signed)
[place], [place] /[id number] Room 540 No family present. N -jb/11.24

## 2022-11-14 DEATH — deceased

## 2022-11-18 NOTE — Hospice (Signed)
Inpatient Nursing Admission   Patient Name: [name]  Date of Birth: July 03, 1932  Age: 88 y.o.        Date of Hospice Admission: (Not on file)  Hospice Attending Elected by Patient: Marlaine Hind Adriana Simas, MD  Primary Care Physician: Sonnie Alamo, APRN - NP  Admitting RN: Earnestine Leys RN  Social Worker: Cleda Daub          Level of Care (GIP/Routine/Respite): GIP  Facility of Care: [facility]  Patient Room: 540/02      HOSPICE SUMMARY   ER Visits/ Hospitalizations in past year: 1  Hospice Diagnosis: Intracerebral hemorrhage of brainstem ([place]) [I61.3]  Onset Date of Hospice Diagnosis: yesterday  Summary of Disease Progression Leading to Hospice Diagnosis: Per Hospitalist;    88 year old man with a past medical history significant for dementia, paroxysmal atrial fibrillation on Eliquis, status post pacemaker insertion, dyslipidemia, and hypothyroidism, presented at the emergency room with change in mental status.  The patient resides at the memory unit at the assisted living facility.  The patient was noted to be in his bed and could not wake up.  The patient did not respond to painful stimuli.  EMS was called.  When the EMS arrived at the scene, it was reported that the patient has intermittent apnea and the patient was brought to the emergency room for further evaluation.  When the patient arrived at the emergency room, CT scan of the head shows large acute parenchymal hematoma in the left parietal lobe with mass effect.  The patient is not able to provide history.  History was obtained from the patient's niece, who was present at the bedside.  The niece was also the power of attorney.  The goal of care was discussed with the niece by the emergency room physician.  The patient's niece did not want any heroic measures and the patient has an active DNR status, which went into reset.  The patient was made comfort measures only status following the discussion between the emergency room physician and the patient's  niece.  The patient received morphine in the emergency room and the patient was in the emergency room for couple of hours with expectation that the patient was going to expire.  When the patient did not expire despite being on comfort measures only in the emergency room, the patient was referred to the hospitalist service for admission.  I had further discussion with the patient's niece at the bedside about the comfort measures only and his status categorized as that is the treatment goal that is wanted for the patient.  The patient is being admitted to the hospital for continuation of comfort measures only status started in the emergency room.  There was no history of fall at the nursing home reported.  It was just stated that the patient was found on the bed and he was not responsive."        Interval history / Subjective:      Pt laying in bed. Demonstrating intermittent apnea. Nonverbal, not following commands. Discussion with nephew at bedside. Family would like comfort measures only and are interested in admission to inpatient hospice. Hospice nurse working on getting in contact with [place].       Assessment & Plan:         Left-sided nontraumatic intracerebral hemorrhage.  - per CT  - discussion with family and consult hospice team - likely dc to inpatient hospice tomorrow morning      Dementia.  Paroxysmal atrial fibrillation, on Eliquis for anticoagulation.  Status post pacemaker insertion.  Dyslipidemia.  Hypothyroidism.  - Comfort measures only         Code status: DNR  Prophylaxis: N/a  Care Plan discussed with: Hospice nurse, pt family   Anticipated Disposition: Tomorrow morning  Inpatient  Cardiac monitoring: None         Co-Morbidities:       Patient Active Problem List   Diagnosis    Acute CVA (cerebrovascular accident) (HCC)    Tachycardia-bradycardia syndrome (HCC)    Pacemaker    At high risk for falls    Paroxysmal atrial fibrillation (HCC)    Chronic renal disease, stage III (HCC)    Comfort  measures only status    Intracerebral hemorrhage of brainstem (HCC)      Diagnoses RELATED to the terminal prognosis: Tachycardia  Other Diagnoses: pacemaker     Rationale for a prognosis of life expectancy of 6 months or less if the disease follows its normal course (Disease Specific History):      TOBEY SCHMELZLE is a 88 y.o. who was admitted to [facility]. The patient's principle diagnosis of ICH has resulted in labored breathing, unresponsive, non verbal pain.  Functionally, the patient's Palliative Performance Scale has declined over a period of hours  and is estimated at 10. Objective information that support this patients limited prognosis includes: Patient had a ICH with midline shift, on arrival confused, agitated., now is unresponsive with labored breathing.  The patient/family chose comfort measures with the support of Hospice.     Patient meets for GIP LOC as evidenced by dyspnea, pain     Prognosis estimated based on 11/06/2022 clinical assessment is:   []  Few to Many Hours  [x]  Hours to Days   []  Few to Many Days   []  Days to Weeks   []  Few to Many Weeks   []  Weeks to Months   []  Few to Many Months     ASSESSMENT    Patient self-reports:  []   Yes              [x]  No     SYMPTOMS: dyspnea, pain     SIGNS/PHYSICAL FINDINGS: labored breaths, non verbal pain     KARNOFSKY: 10     FAST for all dementia:       Learning Assessment:  Patient  Is patient willing/able to learn?  What is the highest level of education completed?  Learning preference (written material, demonstration, visual)?  Learning barriers (ESOL, HOH, poor vision)?     Caregiver  Is caregiver willing to learn care for patient?  What is the highest level of education completed?  Learning preference (written material, demonstration, visual)?  Learning barriers (ESOL, HOH, poor vision)?     CLINICAL INFORMATION          Wt Readings from Last 3 Encounters:   08/09/21 82.6 kg (182 lb)   06/26/21 80.7 kg (178 lb)   05/28/21 86.8 kg (191 lb 6.4  oz)          Ht Readings from Last 3 Encounters:   08/09/21 1.854 m (6\' 1" )   08/01/21 1.854 m (6\' 1" )   06/26/21 1.854 m (6\' 1" )      There is no height or weight on file to calculate BMI.    There were no vitals filed for this visit.     LAB VALUES  @CBCTHISVISIT @  @BMPTHISVISIT @  No results found for: "  TP", "ALBR", "ALB"     Currently this patient has:  []  Supplemental O2   [x]  Peripheral IV          []  PICC                      []  PORT           []  Foley Catheter       []  NG Tube                []  PEG Tube  []  Ostomy          []  AICD: Has ICD been deactivated?  []  Yes          []  No:______     PLAN      1. Admit Gip level of care to [facility]  2. Scheduled IV morphine 2mg  q4hr and prn  3. Scheduled IV lorazepam 2mg  q4hr and prn  4. Other comfort medications for breakthru symptoms   5. Support to family with chaplain and SW  6. Education with facility staff     Hospice Team Frequency Orders:  Skilled Nurse -   Daily x 7 days /every other day x 7 days  with 5 PRN visits for symptom control. MSW - 1 visit for initial assessment/evaluation for family support and need for volunteer services.Lunette Stands. Chaplain - 1 visit for initial assessment/evaluation for spiritual support.      ADVANCE CARE PLANNING (Complete in ACP Flow Sheet)   Code Status: DNR  Durable DNR: [x]   Yes  []   No  Code Status Discussed/Confirmed:  Preference for Other Life Sustaining Treatment Discussed/Confirmed:  Hospitalization Preference:  Patient would like to receive end of life care in the hospital.       11/02/2022     3:06 PM   Demographics   Marital Status Widowed         Financial plannerMilitary Service: []  Yes        []   No       [x]  Unknown  Appropriate for Pinning Ceremony:  []  Yes     []  No  Religion: [employer]  Funeral Home: [employer]     DISCHARGE PLANNING      1. Discharge Plan: patient may return to facility should he stabilize  2. Patient/Family teaching: symptom management  3. Response to patient/family teaching: Nephew verbalizes undersTANDING      SOCIAL/EMOTIONAL/SPIRITUAL NEEDS      Spiritual Issues Identified: family needs support     Psych/ Social/ Emotional Issues Identified: family needs support     Caregiver Support:  []  Provided information on End of Life Care   []  Material Provided: Gone From My Sight or [facility]      CARE COORDINATION   Dr. Christie BeckersMathur contacted, discharge to hospice order received  Dr. Catalina PizzaFlickinger contacted, agrees to serve as attending provider for hospice and provided verbal certification of terminal illness with life expectancy of 6 months or less. Orders for hospice admission, medications and plan of treatment received. Medication reconciliation completed.  MEDS: See medication list below  DME: Per hospital  Supplies: Per hospital  IDT communication to include MD, SN, SW, CH and support team     ALLERGIES AND MEDICATIONS      Allergies:         Allergies   Allergen Reactions    Fluarix Quadrivalent [Influenza Vac Split Quad]         Unknown  Current Facility-Administered Medications   Medication Dose Route Frequency    sodium chloride flush 0.9 % injection 5-40 mL  5-40 mL IntraVENous PRN    ketorolac (TORADOL) injection 30 mg  30 mg IntraVENous Q6H    bisacodyl (DULCOLAX) suppository 10 mg  10 mg Rectal Daily PRN    ondansetron (ZOFRAN) injection 4 mg  4 mg IntraVENous Q6H PRN    glycopyrrolate (ROBINUL) injection 0.2 mg  0.2 mg IntraVENous Q4H PRN    LORazepam (ATIVAN) injection 2 mg  2 mg IntraVENous Q4H    LORazepam (ATIVAN) injection 2 mg  2 mg IntraVENous Q15 Min PRN    morphine (PF) injection 2 mg  2 mg IntraVENous Q15 Min PRN    morphine (PF) injection 2 mg  2 mg IntraVENous Q4H      No current outpatient medications on file.             Facility-Administered Medications Ordered in Other Encounters   Medication Dose Route Frequency    scopolamine (TRANSDERM-SCOP) transdermal patch 1 patch  1 patch TransDERmal Q72H    acetaminophen (TYLENOL) suppository 650 mg  650 mg Rectal Q4H PRN    ondansetron (ZOFRAN)  injection 4 mg  4 mg IntraVENous Q6H PRN    LORazepam (ATIVAN) injection 1 mg  1 mg IntraVENous Q6H PRN    morphine (PF) injection 2 mg  2 mg IntraVENous Q2H PRN     Or    morphine (PF) injection 4 mg  4 mg IntraVENous Q2H PRN

## 2023-02-03 ENCOUNTER — Encounter: Attending: Clinical Cardiac Electrophysiology | Primary: Family

## 2023-02-03 ENCOUNTER — Encounter: Primary: Family
